# Patient Record
Sex: Female | Born: 1935 | Race: Black or African American | Hispanic: No | Marital: Married | Smoking: Never smoker
Health system: Southern US, Community
[De-identification: ages and names within clinical notes are randomized; demographics above are authoritative.]

## PROBLEM LIST (undated history)

## (undated) DIAGNOSIS — F0280 Dementia in other diseases classified elsewhere without behavioral disturbance: Secondary | ICD-10-CM

## (undated) DIAGNOSIS — E785 Hyperlipidemia, unspecified: Secondary | ICD-10-CM

## (undated) DIAGNOSIS — G309 Alzheimer's disease, unspecified: Secondary | ICD-10-CM

## (undated) DIAGNOSIS — I2699 Other pulmonary embolism without acute cor pulmonale: Secondary | ICD-10-CM

## (undated) DIAGNOSIS — K219 Gastro-esophageal reflux disease without esophagitis: Secondary | ICD-10-CM

## (undated) DIAGNOSIS — N289 Disorder of kidney and ureter, unspecified: Secondary | ICD-10-CM

## (undated) DIAGNOSIS — S02640A Fracture of ramus of mandible, unspecified side, initial encounter for closed fracture: Secondary | ICD-10-CM

## (undated) DIAGNOSIS — E860 Dehydration: Secondary | ICD-10-CM

## (undated) DIAGNOSIS — F0390 Unspecified dementia without behavioral disturbance: Secondary | ICD-10-CM

## (undated) DIAGNOSIS — C801 Malignant (primary) neoplasm, unspecified: Secondary | ICD-10-CM

## (undated) DIAGNOSIS — I10 Essential (primary) hypertension: Secondary | ICD-10-CM

## (undated) DIAGNOSIS — I82409 Acute embolism and thrombosis of unspecified deep veins of unspecified lower extremity: Secondary | ICD-10-CM

## (undated) DIAGNOSIS — E876 Hypokalemia: Secondary | ICD-10-CM

## (undated) DIAGNOSIS — N183 Chronic kidney disease, stage 3 unspecified: Secondary | ICD-10-CM

## (undated) HISTORY — PX: BREAST SURGERY: SHX581

## (undated) HISTORY — DX: Other pulmonary embolism without acute cor pulmonale: I26.99

## (undated) HISTORY — PX: OTHER SURGICAL HISTORY: SHX169

## (undated) HISTORY — DX: Acute embolism and thrombosis of unspecified deep veins of unspecified lower extremity: I82.409

---

## 1998-08-01 DIAGNOSIS — C801 Malignant (primary) neoplasm, unspecified: Secondary | ICD-10-CM

## 1998-08-01 HISTORY — DX: Malignant (primary) neoplasm, unspecified: C80.1

## 1998-11-01 ENCOUNTER — Inpatient Hospital Stay (HOSPITAL_COMMUNITY): Admission: EM | Admit: 1998-11-01 | Discharge: 1998-11-04 | Payer: Self-pay | Admitting: Emergency Medicine

## 1998-11-05 ENCOUNTER — Encounter (HOSPITAL_COMMUNITY): Admission: RE | Admit: 1998-11-05 | Discharge: 1999-02-03 | Payer: Self-pay

## 1998-11-06 ENCOUNTER — Encounter (HOSPITAL_COMMUNITY): Payer: Self-pay | Admitting: Psychiatry

## 1999-02-06 ENCOUNTER — Encounter: Payer: Self-pay | Admitting: Rheumatology

## 1999-02-06 ENCOUNTER — Inpatient Hospital Stay (HOSPITAL_COMMUNITY): Admission: EM | Admit: 1999-02-06 | Discharge: 1999-02-09 | Payer: Self-pay | Admitting: Emergency Medicine

## 1999-02-18 ENCOUNTER — Encounter: Payer: Self-pay | Admitting: Gastroenterology

## 1999-02-18 ENCOUNTER — Ambulatory Visit (HOSPITAL_COMMUNITY): Admission: RE | Admit: 1999-02-18 | Discharge: 1999-02-18 | Payer: Self-pay | Admitting: Gastroenterology

## 1999-10-29 ENCOUNTER — Encounter: Admission: RE | Admit: 1999-10-29 | Discharge: 1999-10-29 | Payer: Self-pay | Admitting: Gastroenterology

## 1999-10-29 ENCOUNTER — Encounter: Payer: Self-pay | Admitting: Gastroenterology

## 2001-01-05 ENCOUNTER — Encounter: Admission: RE | Admit: 2001-01-05 | Discharge: 2001-01-05 | Payer: Self-pay | Admitting: Obstetrics & Gynecology

## 2001-01-05 ENCOUNTER — Encounter: Payer: Self-pay | Admitting: Obstetrics & Gynecology

## 2001-02-22 ENCOUNTER — Other Ambulatory Visit: Admission: RE | Admit: 2001-02-22 | Discharge: 2001-02-22 | Payer: Self-pay | Admitting: Obstetrics & Gynecology

## 2001-02-22 ENCOUNTER — Encounter (INDEPENDENT_AMBULATORY_CARE_PROVIDER_SITE_OTHER): Payer: Self-pay

## 2001-02-22 ENCOUNTER — Encounter (INDEPENDENT_AMBULATORY_CARE_PROVIDER_SITE_OTHER): Payer: Self-pay | Admitting: *Deleted

## 2001-05-09 ENCOUNTER — Encounter: Payer: Self-pay | Admitting: Emergency Medicine

## 2001-05-09 ENCOUNTER — Emergency Department (HOSPITAL_COMMUNITY): Admission: EM | Admit: 2001-05-09 | Discharge: 2001-05-09 | Payer: Self-pay | Admitting: Emergency Medicine

## 2001-05-22 ENCOUNTER — Encounter: Payer: Self-pay | Admitting: Gastroenterology

## 2001-05-22 ENCOUNTER — Encounter: Admission: RE | Admit: 2001-05-22 | Discharge: 2001-05-22 | Payer: Self-pay | Admitting: Gastroenterology

## 2001-06-22 ENCOUNTER — Other Ambulatory Visit: Admission: RE | Admit: 2001-06-22 | Discharge: 2001-06-22 | Payer: Self-pay | Admitting: Obstetrics & Gynecology

## 2001-12-04 ENCOUNTER — Other Ambulatory Visit: Admission: RE | Admit: 2001-12-04 | Discharge: 2001-12-04 | Payer: Self-pay | Admitting: Obstetrics & Gynecology

## 2002-01-07 ENCOUNTER — Encounter: Admission: RE | Admit: 2002-01-07 | Discharge: 2002-01-07 | Payer: Self-pay | Admitting: Obstetrics & Gynecology

## 2002-01-07 ENCOUNTER — Encounter: Payer: Self-pay | Admitting: Obstetrics & Gynecology

## 2002-03-21 ENCOUNTER — Encounter: Admission: RE | Admit: 2002-03-21 | Discharge: 2002-03-21 | Payer: Self-pay | Admitting: Urology

## 2002-03-21 ENCOUNTER — Encounter: Payer: Self-pay | Admitting: Urology

## 2002-04-15 ENCOUNTER — Ambulatory Visit (HOSPITAL_COMMUNITY): Admission: RE | Admit: 2002-04-15 | Discharge: 2002-04-15 | Payer: Self-pay | Admitting: Gastroenterology

## 2002-06-18 ENCOUNTER — Other Ambulatory Visit: Admission: RE | Admit: 2002-06-18 | Discharge: 2002-06-18 | Payer: Self-pay | Admitting: Obstetrics & Gynecology

## 2003-02-13 ENCOUNTER — Encounter: Payer: Self-pay | Admitting: Obstetrics & Gynecology

## 2003-02-13 ENCOUNTER — Encounter: Admission: RE | Admit: 2003-02-13 | Discharge: 2003-02-13 | Payer: Self-pay | Admitting: Obstetrics & Gynecology

## 2003-03-11 ENCOUNTER — Other Ambulatory Visit: Admission: RE | Admit: 2003-03-11 | Discharge: 2003-03-11 | Payer: Self-pay | Admitting: Obstetrics & Gynecology

## 2003-06-16 ENCOUNTER — Other Ambulatory Visit: Admission: RE | Admit: 2003-06-16 | Discharge: 2003-06-16 | Payer: Self-pay | Admitting: Obstetrics & Gynecology

## 2004-02-17 ENCOUNTER — Encounter: Admission: RE | Admit: 2004-02-17 | Discharge: 2004-02-17 | Payer: Self-pay | Admitting: Obstetrics & Gynecology

## 2004-08-03 ENCOUNTER — Encounter: Admission: RE | Admit: 2004-08-03 | Discharge: 2004-08-03 | Payer: Self-pay | Admitting: Obstetrics & Gynecology

## 2005-02-17 ENCOUNTER — Encounter: Admission: RE | Admit: 2005-02-17 | Discharge: 2005-02-17 | Payer: Self-pay | Admitting: Obstetrics & Gynecology

## 2006-02-20 ENCOUNTER — Encounter: Admission: RE | Admit: 2006-02-20 | Discharge: 2006-02-20 | Payer: Self-pay | Admitting: Gastroenterology

## 2006-02-22 ENCOUNTER — Encounter: Admission: RE | Admit: 2006-02-22 | Discharge: 2006-02-22 | Payer: Self-pay | Admitting: Gastroenterology

## 2006-02-22 ENCOUNTER — Encounter (INDEPENDENT_AMBULATORY_CARE_PROVIDER_SITE_OTHER): Payer: Self-pay | Admitting: Diagnostic Radiology

## 2006-02-22 ENCOUNTER — Encounter (INDEPENDENT_AMBULATORY_CARE_PROVIDER_SITE_OTHER): Payer: Self-pay | Admitting: *Deleted

## 2006-03-03 ENCOUNTER — Encounter: Admission: RE | Admit: 2006-03-03 | Discharge: 2006-03-03 | Payer: Self-pay

## 2006-03-13 ENCOUNTER — Encounter (INDEPENDENT_AMBULATORY_CARE_PROVIDER_SITE_OTHER): Payer: Self-pay | Admitting: Specialist

## 2006-03-13 ENCOUNTER — Ambulatory Visit (HOSPITAL_COMMUNITY): Admission: AD | Admit: 2006-03-13 | Discharge: 2006-03-15 | Payer: Self-pay | Admitting: General Surgery

## 2006-04-17 ENCOUNTER — Ambulatory Visit: Payer: Self-pay | Admitting: Oncology

## 2006-04-17 LAB — CBC WITH DIFFERENTIAL/PLATELET
BASO%: 0.4 % (ref 0.0–2.0)
Basophils Absolute: 0 10*3/uL (ref 0.0–0.1)
Eosinophils Absolute: 0.2 10*3/uL (ref 0.0–0.5)
LYMPH%: 30.2 % (ref 14.0–48.0)
MCHC: 33.8 g/dL (ref 32.0–36.0)
MCV: 88.2 fL (ref 81.0–101.0)
MONO#: 0.4 10*3/uL (ref 0.1–0.9)
MONO%: 9.1 % (ref 0.0–13.0)
Platelets: 250 10*3/uL (ref 145–400)

## 2006-04-17 LAB — COMPREHENSIVE METABOLIC PANEL
ALT: 9 U/L (ref 0–40)
CO2: 27 mEq/L (ref 19–32)
Calcium: 10.3 mg/dL (ref 8.4–10.5)
Chloride: 102 mEq/L (ref 96–112)
Creatinine, Ser: 1.17 mg/dL (ref 0.40–1.20)
Potassium: 4.6 mEq/L (ref 3.5–5.3)
Sodium: 140 mEq/L (ref 135–145)
Total Bilirubin: 0.4 mg/dL (ref 0.3–1.2)

## 2006-04-18 LAB — FERRITIN: Ferritin: 23 ng/mL (ref 10–291)

## 2006-04-18 LAB — IRON AND TIBC
%SAT: 22 % (ref 20–55)
TIBC: 277 ug/dL (ref 250–470)

## 2007-02-08 ENCOUNTER — Ambulatory Visit (HOSPITAL_BASED_OUTPATIENT_CLINIC_OR_DEPARTMENT_OTHER): Admission: RE | Admit: 2007-02-08 | Discharge: 2007-02-08 | Payer: Self-pay | Admitting: Orthopedic Surgery

## 2008-09-26 ENCOUNTER — Encounter: Admission: RE | Admit: 2008-09-26 | Discharge: 2008-09-26 | Payer: Self-pay | Admitting: Gastroenterology

## 2010-01-29 ENCOUNTER — Encounter: Admission: RE | Admit: 2010-01-29 | Discharge: 2010-01-29 | Payer: Self-pay | Admitting: Gastroenterology

## 2010-08-04 ENCOUNTER — Encounter: Admission: RE | Admit: 2010-08-04 | Payer: Self-pay | Source: Home / Self Care | Admitting: Gastroenterology

## 2010-08-05 ENCOUNTER — Encounter
Admission: RE | Admit: 2010-08-05 | Discharge: 2010-08-05 | Payer: Self-pay | Source: Home / Self Care | Attending: Gastroenterology | Admitting: Gastroenterology

## 2010-08-06 ENCOUNTER — Emergency Department (HOSPITAL_COMMUNITY)
Admission: EM | Admit: 2010-08-06 | Discharge: 2010-08-06 | Payer: Self-pay | Source: Home / Self Care | Admitting: Emergency Medicine

## 2010-08-21 ENCOUNTER — Encounter: Payer: Self-pay | Admitting: Orthopedic Surgery

## 2010-12-14 NOTE — Op Note (Signed)
[name], Kristina                ACCOUNT NO.:  [id number]   MEDICAL RECORD NO.:  [id number]          PATIENT TYPE:  AMB   LOCATION:  DSC                          FACILITY:  [facility]   PHYSICIAN:  Katy Fitch. Sypher, M.D. DATE OF BIRTH:  March 06, 1931   DATE OF PROCEDURE:  DATE OF DISCHARGE:                               OPERATIVE REPORT   Audio too short to transcribe (less than 5 seconds)      Katy Fitch. Sypher, M.D.     RVS/MEDQ  D:  02/08/2007  T:  02/08/2007  Job:  [id number]

## 2010-12-14 NOTE — Op Note (Signed)
NAMEANISSIA, WESSELLS                ACCOUNT NO.:  [id number]   MEDICAL RECORD NO.:  [id number]          PATIENT TYPE:  AMB   LOCATION:  DSC                          FACILITY:  [facility]   PHYSICIAN:  Katy Fitch. Sypher, M.D. DATE OF BIRTH:  Nov 27, 1930   DATE OF PROCEDURE:  02/08/2007  DATE OF DISCHARGE:                               OPERATIVE REPORT   PREOPERATIVE DIAGNOSIS:  Stenosing tenosynovitis, left long and ring  fingers.   POSTOPERATIVE DIAGNOSIS:  Stenosing tenosynovitis, left long and ring  fingers.   OPERATION:  1. Release of left long finger A1 pulley.  2. Release of left ring finger A1 pulley.   SURGEON:  Katy Fitch. Sypher, M.D.   ASSISTANT:  Molly Maduro Dasnoit, P.A.-C.   ANESTHESIA:  2% lidocaine metacarpal head level block of left long and  ring finger supplemented by IV sedation.   SUPERVISING ANESTHESIOLOGIST:  Zenon Mayo, M.D.   INDICATIONS:  Kristina [name] is a 77 year old woman referred through the  courtesy of Dr. Boyd Kerbs for evaluation and management of left long  and ring trigger fingers.  She had a history of chronic stenosing  tenosynovitis.  She could not tolerate steroid injections.  She does  have type 2 diabetes.  Due to a failure to respond to nonoperative  measures, she is brought to the operating at this time for release of  her left long and ring finger A1 pulleys.  Preoperatively, we discussed  the occasional need to resect the ulnar slip of the flexor digitorum and  superficialis to relieve chronic triggering. After informed consent, she  is brought to the operating room at this time.   PROCEDURE:  Pieper Kasik is brought to the operating room and placed in  the supine position on the operating table.  Following light sedation,  the left arm was prepped with Betadine soap solution and sterilely  draped.  2% lidocaine was infiltrated into the palm in the region of the  intended incision and the flexor sheaths of the left long and ring  fingers.  When anesthesia was satisfactory, the left arm was  exsanguinated with an Esmarch bandage and an arterial tourniquet on the  proximal brachium inflated to 260 mmHg due to mild systolic  hypertension.   The procedure commenced with a short transverse incision in the distal  palmar crease.  This allowed examination of the palmar fascia.  The  longitudinal pretendinous fibers of the palmar fascia were released  followed by careful isolation of the A1 pulleys of the long and ring  fingers.  The A1 pulley of the long finger was released along its ulnar  border. A 0 pulley was released.  The tendons were delivered and a cuff  of inflammatory tenosynovium released with scissors.  Thereafter, full  range of motion of the long finger was recovered.  Attention was then  directed to the ring finger flexors.  The A1 pulley was released.  A 0  pulley was released. The tendon was delivered and, once again, a cuff of  fibrotic tenosynovium released with scissors.  Thereafter, full passive  motion of  the ring finger was recovered.  However, there was slight  trapping against the A2 pulley due to swelling of the profundus tendon.   Given the circumstance, I made the judgment that more likely than not,  with release of the A1 pulley, edema would subside.  We did not resect  the ulnar slip of the flexor digitorum superficialis for either finger.  The wound was inspected for bleeding points and repaired with  interrupted sutures of 5-0 nylon.  A compressive dressing was applied  with Xeroflow, sterile gauze, and Ace bandage.  There were no apparent  complications.      Katy Fitch Sypher, M.D.  Electronically Signed     RVS/MEDQ  D:  02/08/2007  T:  02/08/2007  Job:  [id number]   cc:   Tasia Catchings, M.D.

## 2010-12-17 NOTE — Op Note (Signed)
Kristina [name], DELAINE                ACCOUNT NO.:  [id number]   MEDICAL RECORD NO.:  [id number]          PATIENT TYPE:  OIB   LOCATION:  5729                         FACILITY:  [facility]   PHYSICIAN:  Angelia Mould. Derrell Lolling, M.D.DATE OF BIRTH:  1931/07/15   DATE OF PROCEDURE:  03/13/2006  DATE OF DISCHARGE:                                 OPERATIVE REPORT   PREOPERATIVE DIAGNOSIS:  Bilateral breast cancer.   POSTOPERATIVE DIAGNOSIS:  Bilateral breast cancer.   OPERATION PERFORMED:  1. Injection blue dye, right breast.  2. Right sentinel lymph node mapping and biopsy.  3. Right total mastectomy.  4. Left total mastectomy.   SURGEON:  Dr. Claud Kelp.   OPERATIVE INDICATIONS:  This is a 77 year old black female who underwent  left partial mastectomy, in 1998, for ductal carcinoma in situ.  She had  postoperative adjuvant radiation therapy, but upon evaluation by her medical  oncologist they elected no adjuvant chemotherapy or hormonal replacement  therapy.  She has done well, has no complaints about her breasts.  Recently  she had mammograms and ultrasounds and MRIs of both breasts.  The imaging  shows worrisome microcalcifications in the upper-inner quadrant of the right  breast and in the upper-outer quadrant of the left breast.  Both areas  underwent stereotactic core biopsy.  On the right upper-inner she had high-  grade ductal carcinoma in situ with associated necrosis.  On the left outer,  biopsy showed ductal carcinoma in situ with low to intermediate grade.  Both  biopsies were positive for estrogen and progesterone receptor.  Her MRI  shows DCIS bilaterally and a nodule in the central right breast which has  not been biopsied.  No axillary adenopathy was noted.  We have discussed her  care in breast multidisciplinary conference.  She certainly needs a  mastectomy on the left.  After discussion with the patient, she elected to  have bilateral mastectomies and we are going to do a  sentinel lymph node  mapping and biopsy on the right.  She is brought to operating room  electively.   OPERATIVE TECHNIQUE:  The patient was brought to the holding area where she  underwent injection of 1 mCi of technetium sulfur colloid into the right  subareolar area.  The patient was then taken to operating room and underwent  general endotracheal anesthesia.  The periareolar area of the right breast  was prepped alcohol and I injected 5 mL of blue dye, consisting of 2 mL of  methylene blue and 3 mL of saline, in the subareolar area, and the breast  was massaged for 5 minutes.  We then prepped and draped both breasts, both  axilla and shoulders, and the neck in the routine sterile fashion.   Using the NeoProbe, we identified a hot spot in the right axilla, quite  high.  I made a small transverse incision and dissected down through the  subcutaneous tissue and into the axillary space.  Using the NeoProbe, I  identified a single sentinel lymph node which was hot and blue.  This was  removed and sent to  the lab.  After this was removed there was no more  radioactivity in the axilla.  Imprint cytology of the right axillary  sentinel lymph node was negative for cancer.   Essentially, after this I performed bilateral total mastectomies.  I will  describe them as a simultaneous procedure, although I did the right side  first and the left side second.  Transverse elliptical incisions were made  in both breasts.  Skin flaps were raised superiorly to just below the  clavicle, medially to the parasternal area, inferiorly to the anterior  rectus sheath, and laterally to the latissimus dorsi muscle.  On both sides  we dissected the breast off of pectoralis major muscle, from superior to  inferior, taking the fascia with it, but leaving the muscle behind.  We  dissected the breast off of the pectoralis minor muscle, dissected the  breast superiorly to include the tail of Spence, and removed both  specimens,  labeling them separately.  Hemostasis was excellent and achieved with  electrocautery.  Both wounds were copiously irrigated with saline.  I used  two Blake drains on each side, one across the anterior skin flaps and one  more posteriorly, up toward the axilla.  These were brought out through  separate stab incisions in the anterolateral chest wall, sutured to the skin  with some nylon sutures, and connected to suction bulbs.  The mastectomy  incisions were closed with skin staples, as was the right axillary incision.  Clean bandages were placed to include Adaptic gauze, 4x4s, ABDs and 6-inch  Ace wrap around the patient.  The patient tolerated the procedure well and  was taken recovery room in stable condition.  Estimated blood loss was about  100 mL or less.   COMPLICATIONS:  None.   Sponge, needle and instrument counts were correct.      Angelia Mould. Derrell Lolling, M.D.  Electronically Signed     HMI/MEDQ  D:  03/13/2006  T:  03/13/2006  Job:  [id number]   cc:   Tasia Catchings, M.D.

## 2011-05-17 LAB — I-STAT 8, (EC8 V) (CONVERTED LAB)
Acid-Base Excess: 3 — ABNORMAL HIGH
Chloride: 104
Hemoglobin: 10.9 — ABNORMAL LOW
Operator id: 279391
Potassium: 4.1
pCO2, Ven: 54.4 — ABNORMAL HIGH

## 2012-01-29 ENCOUNTER — Encounter (HOSPITAL_COMMUNITY): Payer: Self-pay | Admitting: Emergency Medicine

## 2012-01-29 ENCOUNTER — Emergency Department (HOSPITAL_COMMUNITY): Payer: Medicare Other

## 2012-01-29 ENCOUNTER — Emergency Department (HOSPITAL_COMMUNITY)
Admission: EM | Admit: 2012-01-29 | Discharge: 2012-01-29 | Disposition: A | Discharge status: Home / Self Care | Payer: Medicare Other | Attending: Emergency Medicine | Admitting: Emergency Medicine

## 2012-01-29 DIAGNOSIS — N189 Chronic kidney disease, unspecified: Secondary | ICD-10-CM | POA: Insufficient documentation

## 2012-01-29 DIAGNOSIS — R197 Diarrhea, unspecified: Secondary | ICD-10-CM

## 2012-01-29 DIAGNOSIS — E119 Type 2 diabetes mellitus without complications: Secondary | ICD-10-CM | POA: Insufficient documentation

## 2012-01-29 DIAGNOSIS — M549 Dorsalgia, unspecified: Secondary | ICD-10-CM | POA: Insufficient documentation

## 2012-01-29 DIAGNOSIS — Z79899 Other long term (current) drug therapy: Secondary | ICD-10-CM | POA: Insufficient documentation

## 2012-01-29 DIAGNOSIS — Z794 Long term (current) use of insulin: Secondary | ICD-10-CM | POA: Insufficient documentation

## 2012-01-29 DIAGNOSIS — R109 Unspecified abdominal pain: Secondary | ICD-10-CM

## 2012-01-29 DIAGNOSIS — I129 Hypertensive chronic kidney disease with stage 1 through stage 4 chronic kidney disease, or unspecified chronic kidney disease: Secondary | ICD-10-CM | POA: Insufficient documentation

## 2012-01-29 DIAGNOSIS — G8929 Other chronic pain: Secondary | ICD-10-CM | POA: Insufficient documentation

## 2012-01-29 HISTORY — DX: Essential (primary) hypertension: I10

## 2012-01-29 HISTORY — DX: Disorder of kidney and ureter, unspecified: N28.9

## 2012-01-29 LAB — CBC WITH DIFFERENTIAL/PLATELET
Eosinophils Relative: 3 % (ref 0–5)
HCT: 34 % — ABNORMAL LOW (ref 36.0–46.0)
Hemoglobin: 11 g/dL — ABNORMAL LOW (ref 12.0–15.0)
Lymphocytes Relative: 25 % (ref 12–46)
Lymphs Abs: 1.4 10*3/uL (ref 0.7–4.0)
MCV: 90.9 fL (ref 78.0–100.0)
Monocytes Absolute: 0.4 10*3/uL (ref 0.1–1.0)
Platelets: 218 10*3/uL (ref 150–400)
RBC: 3.74 MIL/uL — ABNORMAL LOW (ref 3.87–5.11)
WBC: 5.8 10*3/uL (ref 4.0–10.5)

## 2012-01-29 LAB — LIPASE, BLOOD: Lipase: 25 U/L (ref 11–59)

## 2012-01-29 LAB — BASIC METABOLIC PANEL
CO2: 27 mEq/L (ref 19–32)
Calcium: 10.3 mg/dL (ref 8.4–10.5)
Glucose, Bld: 80 mg/dL (ref 70–99)
Sodium: 141 mEq/L (ref 135–145)

## 2012-01-29 LAB — URINE MICROSCOPIC-ADD ON

## 2012-01-29 LAB — URINALYSIS, ROUTINE W REFLEX MICROSCOPIC
Glucose, UA: NEGATIVE mg/dL
Protein, ur: NEGATIVE mg/dL
Specific Gravity, Urine: 1.019 (ref 1.005–1.030)

## 2012-01-29 LAB — HEPATIC FUNCTION PANEL
ALT: 10 U/L (ref 0–35)
AST: 22 U/L (ref 0–37)
Albumin: 3.6 g/dL (ref 3.5–5.2)
Total Protein: 7.5 g/dL (ref 6.0–8.3)

## 2012-01-29 LAB — GLUCOSE, CAPILLARY: Glucose-Capillary: 76 mg/dL (ref 70–99)

## 2012-01-29 LAB — OCCULT BLOOD, POC DEVICE: Fecal Occult Bld: NEGATIVE

## 2012-01-29 MED ORDER — IOHEXOL 300 MG/ML  SOLN
20.0000 mL | INTRAMUSCULAR | Status: AC
  Administered 2012-01-29: 20 mL via ORAL

## 2012-01-29 MED ORDER — GLUCOSE-VITAMIN C 4-6 GM-MG PO CHEW
CHEWABLE_TABLET | ORAL | Status: AC
  Administered 2012-01-29: 1
  Filled 2012-01-29: qty 1

## 2012-01-29 NOTE — ED Notes (Signed)
Pt reports Friday night with dark runny stools. Pt c/o abdominal pain, denies N/V.

## 2012-01-29 NOTE — ED Notes (Signed)
Pt ambulatory to the restroom with minimal assistance.

## 2012-01-29 NOTE — ED Notes (Signed)
Patient transported to CT 

## 2012-01-29 NOTE — ED Notes (Signed)
Attempted twice to insert an IV and failed.  Called IV team to attempt.

## 2012-01-29 NOTE — Discharge Instructions (Signed)
Diarrhea Infections caused by germs (bacterial) or a virus commonly cause diarrhea. Your caregiver has determined that with time, rest and fluids, the diarrhea should improve. In general, eat normally while drinking more water than usual. Although water may prevent dehydration, it does not contain salt and minerals (electrolytes). Broths, weak tea without caffeine and oral rehydration solutions (ORS) replace fluids and electrolytes. Small amounts of fluids should be taken frequently. Large amounts at one time may not be tolerated. Plain water may be harmful in infants and the elderly. Oral rehydrating solutions (ORS) are available at pharmacies and grocery stores. ORS replace water and important electrolytes in proper proportions. Sports drinks are not as effective as ORS and may be harmful due to sugars worsening diarrhea.  ORS is especially recommended for use in children with diarrhea. As a general guideline for children, replace any new fluid losses from diarrhea and/or vomiting with ORS as follows:   If your child weighs 22 pounds or under (10 kg or less), give 60-120 mL ( -  cup or 2 - 4 ounces) of ORS for each episode of diarrheal stool or vomiting episode.   If your child weighs more than 22 pounds (more than 10 kgs), give 120-240 mL ( - 1 cup or 4 - 8 ounces) of ORS for each diarrheal stool or episode of vomiting.   While correcting for dehydration, children should eat normally. However, foods high in sugar should be avoided because this may worsen diarrhea. Large amounts of carbonated soft drinks, juice, gelatin desserts and other highly sugared drinks should be avoided.   After correction of dehydration, other liquids that are appealing to the child may be added. Children should drink small amounts of fluids frequently and fluids should be increased as tolerated. Children should drink enough fluids to keep urine clear or pale yellow.   Adults should eat normally while drinking more fluids  than usual. Drink small amounts of fluids frequently and increase as tolerated. Drink enough fluids to keep urine clear or pale yellow. Broths, weak decaffeinated tea, lemon lime soft drinks (allowed to go flat) and ORS replace fluids and electrolytes.   Avoid:   Carbonated drinks.   Juice.   Extremely hot or cold fluids.   Caffeine drinks.   Fatty, greasy foods.   Alcohol.   Tobacco.   Too much intake of anything at one time.   Gelatin desserts.   Probiotics are active cultures of beneficial bacteria. They may lessen the amount and number of diarrheal stools in adults. Probiotics can be found in yogurt with active cultures and in supplements.   Wash hands well to avoid spreading bacteria and virus.   Anti-diarrheal medications are not recommended for infants and children.   Only take over-the-counter or prescription medicines for pain, discomfort or fever as directed by your caregiver. Do not give aspirin to children because it may cause Reye's Syndrome.   For adults, ask your caregiver if you should continue all prescribed and over-the-counter medicines.   If your caregiver has given you a follow-up appointment, it is very important to keep that appointment. Not keeping the appointment could result in a chronic or permanent injury, and disability. If there is any problem keeping the appointment, you must call back to this facility for assistance.  SEEK IMMEDIATE MEDICAL CARE IF:   You or your child is unable to keep fluids down or other symptoms or problems become worse in spite of treatment.   Vomiting or diarrhea develops and becomes persistent.     There is vomiting of blood or bile (green material).   There is blood in the stool or the stools are black and tarry.   There is no urine output in 6-8 hours or there is only a small amount of very dark urine.   Abdominal pain develops, increases or localizes.   You have a fever.   Your baby is older than 3 months with a  rectal temperature of 102 F (38.9 C) or higher.   Your baby is 44 months old or younger with a rectal temperature of 100.4 F (38 C) or higher.   You or your child develops excessive weakness, dizziness, fainting or extreme thirst.   You or your child develops a rash, stiff neck, severe headache or become irritable or sleepy and difficult to awaken.  MAKE SURE YOU:   Understand these instructions.   Will watch your condition.   Will get help right away if you are not doing well or get worse.  Document Released: 07/08/2002 Document Revised: 07/07/2011 Document Reviewed: 05/25/2009 [facility] Patient Information 2012 [place], [place].  RESOURCE GUIDE  Dental Problems  Patients with Medicaid: [facility] [phone]                                           [phone] [address] Phone:  [phone]                                                   Phone:  [phone]  If unable to pay or uninsured, contact:  [facility] or [facility]. to become qualified for the adult dental clinic.  Chronic Pain Problems Contact [facility]  [phone] Patients need to be referred by their primary care doctor.  Insufficient Money for Medicine Contact [employer]:  call "211" or [facility] [phone].  No Primary Care Doctor Call [facility]  [phone] Other agencies that provide inexpensive medical care    [facility]  [phone]    [facility]  [phone]    [facility]  [phone]    [facility]  [phone]    [facility]  [phone]    [facility]  [phone]  Psychological Services [facility]  [phone] [facility]  [phone] [facility] Mental Health   [phone] (emergency services [phone])  Abuse/Neglect [facility] Child Abuse Hotline [phone] [facility] Child Abuse Hotline [phone] (After  Hours)  Emergency Shelter [facility] [phone]  [facility] [place] [facility] [place] 6021380065 [facility] Alert Services [phone]  MRSA Hotline #:   [phone]    [facility] Resources  [facility]  [employer]                           [facility] Dept. [address] [place]                     [address]  [address]  [place]                                               Cristobal Goldmann Phone:  [phone]                                  Phone:  [phone]                   Phone:  [phone]  [facility] Mental Health Phone:  [phone]  [facility] Child Abuse Hotline 480-619-0866 [phone] (After Hours)

## 2012-01-29 NOTE — ED Notes (Signed)
Patient is alert and oriented x4.  Patient is free of pain. Patient is leaving with significant other who will transport her home.  Vitals signs are charted.  No further action taken.

## 2012-01-29 NOTE — ED Provider Notes (Signed)
History     CSN: [id number]  Arrival date & time 01/29/12  1526   First MD Initiated Contact with Patient 01/29/12 1639      Chief Complaint  Patient presents with  . Diarrhea    (Consider location/radiation/quality/duration/timing/severity/associated sxs/prior treatment) HPI  80yoM h/o HTN, DM pw abdominal pain, diarrhea. C/O several episodes of dark stool per day which is watery. C/O crampy abdominal, diffuse. +Nausea without vomiting. No h/o GI bleed in past. Last colonoscopy 10 years ago--nl per patient. Chronic back pain at baseline. Denies hematuria/dysuria/freq/urgency. Denies fever, chills. No recent hosp or abx use. Concerned about whether or not she could be having GIB  No h/o abdominal surgeries  ED Notes, ED Provider Notes from 01/29/12 0000 to 01/29/12 16:12:20       Keshia Tommy Rainwater, RN 01/29/2012 16:09      Pt reports Friday night with dark runny stools. Pt c/o abdominal pain, denies N/V.     Past Medical History  Diagnosis Date  . Diabetes mellitus   . Hypertension   . Renal disorder     Past Surgical History  Procedure Date  . Breast surgery   . Mastecomy     History reviewed. No pertinent family history.  History  Substance Use Topics  . Smoking status: Never Smoker   . Smokeless tobacco: Not on file  . Alcohol Use: No    OB History    Grav Para Term Preterm Abortions TAB SAB Ect Mult Living                  Review of Systems  All other systems reviewed and are negative.  except as noted HPI   Allergies  Penicillins  Home Medications   Current Outpatient Rx  Name Route Sig Dispense Refill  . CALCITRIOL 0.25 MCG PO CAPS Oral Take 0.25 mcg by mouth daily.    . CHOLECALCIFEROL 400 UNITS PO TABS Oral Take 400 Units by mouth daily.    Marland Kitchen HYDROCHLOROTHIAZIDE 25 MG PO TABS Oral Take 25 mg by mouth daily.    . INSULIN LISPRO PROT & LISPRO (75-25) 100 UNIT/ML [place] SUSP Subcutaneous Inject 8-10 Units into the skin 2 (two) times daily. Uses 10  units in the AM and 8 units at bedtime    . OMEPRAZOLE 20 MG PO CPDR Oral Take 20 mg by mouth daily.    . SERTRALINE HCL 50 MG PO TABS Oral Take 50 mg by mouth daily.    Marland Kitchen SIMVASTATIN 40 MG PO TABS Oral Take 40 mg by mouth every evening.    Marland Kitchen VALSARTAN 160 MG PO TABS Oral Take 160 mg by mouth daily.      BP 114/44  Pulse 63  Temp 98.2 F (36.8 C) (Oral)  Resp 18  SpO2 100%  Physical Exam  Nursing note and vitals reviewed. Constitutional: She is oriented to person, place, and time. She appears well-developed.  HENT:  Head: Atraumatic.  Mouth/Throat: Oropharynx is clear and moist.  Eyes: Conjunctivae and EOM are normal. Pupils are equal, round, and reactive to light.  Neck: Normal range of motion. Neck supple.  Cardiovascular: Normal rate, regular rhythm, normal heart sounds and intact distal pulses.   Pulmonary/Chest: Effort normal and breath sounds normal. No respiratory distress. She has no wheezes. She has no rales.  Abdominal: Soft. She exhibits no distension. There is tenderness. There is no rebound and no guarding.       Diffuse abd ttp no r/g  Genitourinary:  Brown stool, heme neg No stool in rectal vault  Musculoskeletal: Normal range of motion.  Neurological: She is alert and oriented to person, place, and time.  Skin: Skin is warm and dry. No rash noted.  Psychiatric: She has a normal mood and affect.    ED Course  Procedures (including critical care time)  Labs Reviewed  CBC WITH DIFFERENTIAL - Abnormal; Notable for the following:    RBC 3.74 (*)     Hemoglobin 11.0 (*)     HCT 34.0 (*)     All other components within normal limits  BASIC METABOLIC PANEL - Abnormal; Notable for the following:    BUN 38 (*)     Creatinine, Ser 1.86 (*)     GFR calc non Af Amer 25 (*)     GFR calc Af Amer 29 (*)     All other components within normal limits  URINALYSIS, ROUTINE W REFLEX MICROSCOPIC - Abnormal; Notable for the following:    Hgb urine dipstick TRACE (*)      Leukocytes, UA TRACE (*)     All other components within normal limits  GLUCOSE, CAPILLARY - Abnormal; Notable for the following:    Glucose-Capillary 66 (*)     All other components within normal limits  URINE MICROSCOPIC-ADD ON - Abnormal; Notable for the following:    Squamous Epithelial / LPF FEW (*)     All other components within normal limits  LIPASE, BLOOD  HEPATIC FUNCTION PANEL  OCCULT BLOOD, POC DEVICE  GLUCOSE, CAPILLARY  OCCULT BLOOD, POC DEVICE  CLOSTRIDIUM DIFFICILE BY PCR  CLOSTRIDIUM DIFFICILE BY PCR   Ct Abdomen Pelvis Wo Contrast  01/29/2012  *RADIOLOGY REPORT*  Clinical Data: Left lower quadrant pain  CT ABDOMEN AND PELVIS WITHOUT CONTRAST  Technique:  Multidetector CT imaging of the abdomen and pelvis was performed following the standard protocol without intravenous contrast.  Comparison: 09/26/2008  Findings: Coronary artery calcification.  Lung bases are clear.  Organ abnormality/lesion detection is limited in the absence of intravenous contrast. Within this limitation, punctate liver calcifications in keeping with sequelae of prior granulomas infection.  Unremarkable spleen.  Atrophic pancreas. Unchanged adrenal glands with nodular enlargement of the lateral limb left adrenal gland.  There is questionable layering sludge or stones within the gallbladder.  No pericholecystic fluid.  No biliary ductal dilatation.  Symmetric renal size.  No hydronephrosis or hydroureter.  Mildly lobular contours.  Colonic diverticulosis.  No overt evidence for diverticulitis.  No bowel obstruction.  No free intraperitoneal air or fluid.  No lymphadenopathy.  There is scattered atherosclerotic calcification of the aorta and its branches. No aneurysmal dilatation.  Decompressed bladder.  Unremarkable noncontrast appearance to the uterus and adnexa.  Multilevel degenerative changes of the imaged spine. No acute or aggressive appearing osseous lesion.  IMPRESSION: Within limitations of a  noncontrast examination, no acute abnormality identified.  Colonic diverticulosis without overt evidence for diverticulitis.  Original Report Authenticated By: Waneta Martins, M.D.    1. Diarrhea   2. Abdominal cramping   3. Chronic renal insufficiency     MDM  Diarrhea and abdominal cramping. The patient appears clinically hydrated. She has no blood in her stool. C. difficile ordered. If the patient is able to produce a sample prior to discharge will be sent. CT A/P without contrast (CRI)She is advised to follow with her primary care doctor tomorrow for an appointment early next week. Advised to drink clear fluids. Advance diet as tolerated. No EMC precluding discharge  at this time. Tolerating PO in ED and can self hydrate. Per pt her GFR usually 25. 29 today and Cr 1.8 likely 2/2 mild dehydration. Given Precautions for return. PMD, Nephrology f/u.         Forbes Cellar, MD 01/29/12 2206

## 2012-10-15 ENCOUNTER — Emergency Department (HOSPITAL_COMMUNITY): Payer: Medicare Other

## 2012-10-15 ENCOUNTER — Inpatient Hospital Stay (HOSPITAL_COMMUNITY)
Admission: EM | Admit: 2012-10-15 | Discharge: 2012-10-21 | DRG: 176 | Disposition: A | Discharge status: Home / Self Care | Payer: Medicare Other | Attending: Internal Medicine | Admitting: Internal Medicine

## 2012-10-15 ENCOUNTER — Encounter (HOSPITAL_COMMUNITY): Payer: Self-pay | Admitting: Emergency Medicine

## 2012-10-15 DIAGNOSIS — I82402 Acute embolism and thrombosis of unspecified deep veins of left lower extremity: Secondary | ICD-10-CM

## 2012-10-15 DIAGNOSIS — M1990 Unspecified osteoarthritis, unspecified site: Secondary | ICD-10-CM | POA: Diagnosis present

## 2012-10-15 DIAGNOSIS — E119 Type 2 diabetes mellitus without complications: Secondary | ICD-10-CM | POA: Diagnosis present

## 2012-10-15 DIAGNOSIS — E7800 Pure hypercholesterolemia, unspecified: Secondary | ICD-10-CM | POA: Diagnosis present

## 2012-10-15 DIAGNOSIS — I82409 Acute embolism and thrombosis of unspecified deep veins of unspecified lower extremity: Secondary | ICD-10-CM | POA: Diagnosis present

## 2012-10-15 DIAGNOSIS — N183 Chronic kidney disease, stage 3 unspecified: Secondary | ICD-10-CM | POA: Diagnosis present

## 2012-10-15 DIAGNOSIS — M810 Age-related osteoporosis without current pathological fracture: Secondary | ICD-10-CM | POA: Diagnosis present

## 2012-10-15 DIAGNOSIS — E162 Hypoglycemia, unspecified: Secondary | ICD-10-CM | POA: Diagnosis present

## 2012-10-15 DIAGNOSIS — F3289 Other specified depressive episodes: Secondary | ICD-10-CM | POA: Diagnosis present

## 2012-10-15 DIAGNOSIS — F329 Major depressive disorder, single episode, unspecified: Secondary | ICD-10-CM | POA: Diagnosis present

## 2012-10-15 DIAGNOSIS — I2699 Other pulmonary embolism without acute cor pulmonale: Principal | ICD-10-CM | POA: Diagnosis present

## 2012-10-15 DIAGNOSIS — I824Z9 Acute embolism and thrombosis of unspecified deep veins of unspecified distal lower extremity: Secondary | ICD-10-CM | POA: Diagnosis present

## 2012-10-15 DIAGNOSIS — K219 Gastro-esophageal reflux disease without esophagitis: Secondary | ICD-10-CM | POA: Diagnosis present

## 2012-10-15 DIAGNOSIS — I10 Essential (primary) hypertension: Secondary | ICD-10-CM | POA: Diagnosis present

## 2012-10-15 DIAGNOSIS — I129 Hypertensive chronic kidney disease with stage 1 through stage 4 chronic kidney disease, or unspecified chronic kidney disease: Secondary | ICD-10-CM | POA: Diagnosis present

## 2012-10-15 DIAGNOSIS — R0600 Dyspnea, unspecified: Secondary | ICD-10-CM | POA: Diagnosis present

## 2012-10-15 HISTORY — DX: Malignant (primary) neoplasm, unspecified: C80.1

## 2012-10-15 LAB — BASIC METABOLIC PANEL
CO2: 29 mEq/L (ref 19–32)
Chloride: 101 mEq/L (ref 96–112)
GFR calc Af Amer: 35 mL/min — ABNORMAL LOW (ref >90)
Potassium: 4 mEq/L (ref 3.5–5.1)
Sodium: 138 mEq/L (ref 135–145)

## 2012-10-15 LAB — CBC WITH DIFFERENTIAL/PLATELET
Basophils Absolute: 0 10*3/uL (ref 0.0–0.1)
Basophils Relative: 0 % (ref 0–1)
HCT: 32.5 % — ABNORMAL LOW (ref 36.0–46.0)
Lymphocytes Relative: 18 % (ref 12–46)
MCHC: 32.9 g/dL (ref 30.0–36.0)
Monocytes Absolute: 0.3 10*3/uL (ref 0.1–1.0)
Neutro Abs: 3.9 10*3/uL (ref 1.7–7.7)
Neutrophils Relative %: 75 % (ref 43–77)
RDW: 13 % (ref 11.5–15.5)
WBC: 5.2 10*3/uL (ref 4.0–10.5)

## 2012-10-15 LAB — URINALYSIS, ROUTINE W REFLEX MICROSCOPIC
Bilirubin Urine: NEGATIVE
Ketones, ur: NEGATIVE mg/dL
Nitrite: NEGATIVE
Protein, ur: NEGATIVE mg/dL
Specific Gravity, Urine: 1.018 (ref 1.005–1.030)
Urobilinogen, UA: 0.2 mg/dL (ref 0.0–1.0)

## 2012-10-15 LAB — GLUCOSE, CAPILLARY
Glucose-Capillary: 149 mg/dL — ABNORMAL HIGH (ref 70–99)
Glucose-Capillary: 123 mg/dL — ABNORMAL HIGH (ref 70–99)

## 2012-10-15 LAB — POCT I-STAT TROPONIN I: Troponin i, poc: 0 ng/mL (ref 0.00–0.08)

## 2012-10-15 LAB — URINE MICROSCOPIC-ADD ON

## 2012-10-15 MED ORDER — ACETAMINOPHEN 325 MG PO TABS: 650.0000 mg | ORAL_TABLET | Freq: Four times a day (QID) | ORAL | Status: DC | PRN

## 2012-10-15 MED ORDER — POLYETHYLENE GLYCOL 3350 17 G PO PACK
17.0000 g | PACK | Freq: Every day | ORAL | Status: DC | PRN
  Filled 2012-10-15: qty 1

## 2012-10-15 MED ORDER — SERTRALINE HCL 50 MG PO TABS
50.0000 mg | ORAL_TABLET | Freq: Every day | ORAL | Status: DC
  Administered 2012-10-16 – 2012-10-21 (×6): 50 mg via ORAL
  Filled 2012-10-15 (×6): qty 1

## 2012-10-15 MED ORDER — HEPARIN BOLUS VIA INFUSION
4000.0000 [IU] | Freq: Once | INTRAVENOUS | Status: AC
  Administered 2012-10-15: 4000 [IU] via INTRAVENOUS
  Filled 2012-10-15: qty 4000

## 2012-10-15 MED ORDER — KETOROLAC TROMETHAMINE 0.5 % OP SOLN
1.0000 [drp] | Freq: Four times a day (QID) | OPHTHALMIC | Status: DC
  Administered 2012-10-16 – 2012-10-20 (×20): 1 [drp] via OPHTHALMIC
  Filled 2012-10-15: qty 3

## 2012-10-15 MED ORDER — DEXTROSE 50 % IV SOLN
25.0000 mL | Freq: Once | INTRAVENOUS | Status: AC
  Administered 2012-10-15: 25 mL via INTRAVENOUS
  Filled 2012-10-15: qty 50

## 2012-10-15 MED ORDER — WARFARIN - PHARMACIST DOSING INPATIENT: Freq: Every day | Status: DC

## 2012-10-15 MED ORDER — SODIUM CHLORIDE 0.9 % IJ SOLN
3.0000 mL | Freq: Two times a day (BID) | INTRAMUSCULAR | Status: DC
  Administered 2012-10-15: 3 mL via INTRAVENOUS

## 2012-10-15 MED ORDER — SODIUM CHLORIDE 0.9 % IV SOLN
INTRAVENOUS | Status: AC
  Administered 2012-10-15: via INTRAVENOUS

## 2012-10-15 MED ORDER — INSULIN ASPART 100 UNIT/ML ~~LOC~~ SOLN
0.0000 [IU] | Freq: Three times a day (TID) | SUBCUTANEOUS | Status: DC
  Administered 2012-10-16 – 2012-10-21 (×6): 1 [IU] via SUBCUTANEOUS

## 2012-10-15 MED ORDER — INSULIN ASPART 100 UNIT/ML ~~LOC~~ SOLN: 0.0000 [IU] | Freq: Every day | SUBCUTANEOUS | Status: DC

## 2012-10-15 MED ORDER — ONDANSETRON HCL 4 MG PO TABS: 4.0000 mg | ORAL_TABLET | Freq: Four times a day (QID) | ORAL | Status: DC | PRN

## 2012-10-15 MED ORDER — PANTOPRAZOLE SODIUM 40 MG PO TBEC
40.0000 mg | DELAYED_RELEASE_TABLET | Freq: Every day | ORAL | Status: DC
  Administered 2012-10-16 – 2012-10-21 (×6): 40 mg via ORAL
  Filled 2012-10-15 (×5): qty 1

## 2012-10-15 MED ORDER — DOCUSATE SODIUM 100 MG PO CAPS
100.0000 mg | ORAL_CAPSULE | Freq: Two times a day (BID) | ORAL | Status: DC
  Administered 2012-10-16 – 2012-10-21 (×10): 100 mg via ORAL
  Filled 2012-10-15 (×12): qty 1

## 2012-10-15 MED ORDER — HEPARIN (PORCINE) IN NACL 100-0.45 UNIT/ML-% IJ SOLN
1000.0000 [IU]/h | INTRAMUSCULAR | Status: DC
  Administered 2012-10-15: 1000 [IU]/h via INTRAVENOUS
  Filled 2012-10-15 (×4): qty 250

## 2012-10-15 MED ORDER — SENNA 8.6 MG PO TABS
1.0000 | ORAL_TABLET | Freq: Two times a day (BID) | ORAL | Status: DC
  Administered 2012-10-16 – 2012-10-21 (×10): 8.6 mg via ORAL
  Filled 2012-10-15 (×14): qty 1

## 2012-10-15 MED ORDER — ACETAMINOPHEN 650 MG RE SUPP: 650.0000 mg | Freq: Four times a day (QID) | RECTAL | Status: DC | PRN

## 2012-10-15 MED ORDER — CALCITRIOL 0.25 MCG PO CAPS
0.2500 ug | ORAL_CAPSULE | Freq: Every day | ORAL | Status: DC
Start: 2012-10-16 — End: 2012-10-21
  Administered 2012-10-16 – 2012-10-21 (×6): 0.25 ug via ORAL
  Filled 2012-10-15 (×6): qty 1

## 2012-10-15 MED ORDER — SIMVASTATIN 40 MG PO TABS
40.0000 mg | ORAL_TABLET | Freq: Every evening | ORAL | Status: DC
  Administered 2012-10-16 – 2012-10-20 (×5): 40 mg via ORAL
  Filled 2012-10-15 (×6): qty 1

## 2012-10-15 MED ORDER — ONDANSETRON HCL 4 MG/2ML IJ SOLN: 4.0000 mg | Freq: Four times a day (QID) | INTRAMUSCULAR | Status: DC | PRN

## 2012-10-15 MED ORDER — IOHEXOL 350 MG/ML SOLN
100.0000 mL | Freq: Once | INTRAVENOUS | Status: AC | PRN
  Administered 2012-10-15: 100 mL via INTRAVENOUS

## 2012-10-15 NOTE — ED Notes (Signed)
Pt c/o SOB and dizziness with syncopal episode today; pt sts hx of similar in past; pt denies CP

## 2012-10-15 NOTE — ED Provider Notes (Signed)
History     CSN: [id number]  Arrival date & time 10/15/12  1542   First MD Initiated Contact with Patient 10/15/12 1608      Chief Complaint  Patient presents with  . Loss of Consciousness  . Shortness of Breath    (Consider location/radiation/quality/duration/timing/severity/associated sxs/prior treatment) HPI Pt states she had several episodes of being unable to "catch her breath". She states she was in another room and had another episode of breathlessness and called to her husband. When he arrived she collapsed in his arms. No trauma. No definite LOC. Pt denied chest pain, recent fever chills, abd pain, urinary symptoms. Pt has had multiple falls in the past but states this was different in the fact she did not lose balance, she became weak. No weakness, dizziness, SOB now. Pt states she is at her baseline. Past Medical History  Diagnosis Date  . Diabetes mellitus   . Hypertension   . Renal disorder     Past Surgical History  Procedure Laterality Date  . Breast surgery    . Mastecomy      History reviewed. No pertinent family history.  History  Substance Use Topics  . Smoking status: Never Smoker   . Smokeless tobacco: Not on file  . Alcohol Use: No    OB History   Grav Para Term Preterm Abortions TAB SAB Ect Mult Living                  Review of Systems  Constitutional: Negative for fever and chills.  HENT: Negative for neck pain.   Respiratory: Positive for shortness of breath. Negative for cough and wheezing.   Cardiovascular: Negative for chest pain, palpitations and leg swelling.  Gastrointestinal: Negative for nausea, vomiting and abdominal pain.  Genitourinary: Negative for dysuria and frequency.  Musculoskeletal: Negative for back pain.  Skin: Negative for rash and wound.  Neurological: Positive for weakness. Negative for dizziness, syncope, light-headedness, numbness and headaches.  All other systems reviewed and are negative.    Allergies   Oxycodone and Penicillins  Home Medications   Current Outpatient Rx  Name  Route  Sig  Dispense  Refill  . acetaminophen (TYLENOL) 500 MG tablet   Oral   Take 1,000 mg by mouth every 6 (six) hours as needed for pain.         . calcitRIOL (ROCALTROL) 0.25 MCG capsule   Oral   Take 0.25 mcg by mouth daily.         . hydrochlorothiazide (HYDRODIURIL) 25 MG tablet   Oral   Take 25 mg by mouth daily.         . insulin lispro protamine-insulin lispro (HUMALOG 75/25) (75-25) 100 UNIT/ML SUSP   Subcutaneous   Inject 8-10 Units into the skin 2 (two) times daily. Uses 10 units in the AM and 8 units at bedtime         . ketorolac (ACULAR) 0.5 % ophthalmic solution   Left Eye   Place 1 drop into the left eye 4 (four) times daily.         Marland Kitchen omeprazole (PRILOSEC) 20 MG capsule   Oral   Take 20 mg by mouth daily.         . sertraline (ZOLOFT) 50 MG tablet   Oral   Take 50 mg by mouth daily.         . simvastatin (ZOCOR) 40 MG tablet   Oral   Take 40 mg by mouth every evening.         Marland Kitchen  valsartan (DIOVAN) 160 MG tablet   Oral   Take 160 mg by mouth daily.           BP 122/44  Pulse 71  Temp(Src) 97.2 F (36.2 C) (Oral)  Resp 17  SpO2 100%  Physical Exam  Nursing note and vitals reviewed. Constitutional: She is oriented to person, place, and time. She appears well-developed and well-nourished. No distress.  HENT:  Head: Normocephalic and atraumatic.  Mouth/Throat: Oropharynx is clear and moist.  Eyes: EOM are normal. Pupils are equal, round, and reactive to light.  Neck: Normal range of motion. Neck supple.  Cardiovascular: Normal rate and regular rhythm.   Pulmonary/Chest: Effort normal and breath sounds normal. No respiratory distress. She has no wheezes. She has no rales. She exhibits no tenderness.  Abdominal: Soft. Bowel sounds are normal. She exhibits no mass. There is no tenderness. There is no rebound and no guarding.  Musculoskeletal: Normal  range of motion. She exhibits no edema and no tenderness.  Neurological: She is alert and oriented to person, place, and time.  5/5 motor in all ext, sensation intact.   Skin: Skin is warm and dry. No rash noted. No erythema.  Psychiatric: She has a normal mood and affect. Her behavior is normal.    ED Course  Procedures (including critical care time)  Labs Reviewed  CBC WITH DIFFERENTIAL - Abnormal; Notable for the following:    RBC 3.67 (*)    Hemoglobin 10.7 (*)    HCT 32.5 (*)    All other components within normal limits  BASIC METABOLIC PANEL - Abnormal; Notable for the following:    Glucose, Bld 146 (*)    BUN 37 (*)    Creatinine, Ser 1.58 (*)    GFR calc non Af Amer 30 (*)    GFR calc Af Amer 35 (*)    All other components within normal limits  GLUCOSE, CAPILLARY - Abnormal; Notable for the following:    Glucose-Capillary 149 (*)    All other components within normal limits  URINALYSIS, ROUTINE W REFLEX MICROSCOPIC - Abnormal; Notable for the following:    Hgb urine dipstick SMALL (*)    All other components within normal limits  D-DIMER, QUANTITATIVE - Abnormal; Notable for the following:    D-Dimer, Quant 11.00 (*)    All other components within normal limits  URINE MICROSCOPIC-ADD ON - Abnormal; Notable for the following:    Squamous Epithelial / LPF FEW (*)    All other components within normal limits  GLUCOSE, CAPILLARY  POCT I-STAT TROPONIN I   Dg Chest 2 View  10/15/2012  *RADIOLOGY REPORT*  Clinical Data: Shortness of breath  CHEST - 2 VIEW  Comparison: 03/09/2006  Findings: Stable hyperinflation compatible with background COPD/emphysema.  Normal heart size and vascularity.  No focal pneumonia, collapse, consolidation, edema, effusion or pneumothorax.  Trachea midline.  Degenerative changes of the spine.  IMPRESSION: Stable hyperinflation.  No superimposed acute process   Original Report Authenticated By: Judie Petit. Miles Costain, M.D.    Ct Head Wo Contrast  10/15/2012   *RADIOLOGY REPORT*  Clinical Data: Confusion and memory loss  CT HEAD WITHOUT CONTRAST  Technique:  Contiguous axial images were obtained from the base of the skull through the vertex without contrast.  Comparison: None  Findings: Ventricle size is normal.  Negative for intracranial hemorrhage or acute infarct.  Negative for mass or edema.  No bony abnormality in the skull.  IMPRESSION: Negative   Original Report Authenticated By: Onalee Hua  Chestine Spore, M.D.    Ct Angio Chest Pe W/cm &/or Wo Cm  10/15/2012  *RADIOLOGY REPORT*  Clinical Data: .  SOB.  Near-syncope  CT ANGIOGRAPHY CHEST  Technique:  Multidetector CT imaging of the chest using the standard protocol during bolus administration of intravenous contrast. Multiplanar reconstructed images including MIPs were obtained and reviewed to evaluate the vascular anatomy.  Contrast: OMNIPAQUE IOHEXOL 350 MG/ML SOLN  Comparison: 10/15/2012  Findings: Pulmonary emboli are present in the right upper lobe and right lower lobe pulmonary arteries.  No emboli are seen on the left.  Heart size is normal.  No pericardial effusion.  Aortic diameter is normal with mild atherosclerotic disease.  Lungs are clear without infiltrate or effusion.  No mass or adenopathy is present.  No acute spinal abnormality.  IMPRESSION: Right-sided pulmonary emboli.  Moderate clot burden on the right.  I discussed the findings by telephone with Dr. Ranae Palms   Original Report Authenticated By: Janeece Riggers, M.D.      1. Pulmonary embolism on right      Date: 10/15/2012  Rate: 72  Rhythm: normal sinus rhythm  QRS Axis: normal  Intervals: normal  ST/T Wave abnormalities: normal  Conduction Disutrbances:none  Narrative Interpretation:   Old EKG Reviewed: none available    MDM    Pt is currently asymptomatic. VSS. Discussed with Dr Adela Glimpse. Will admit.       Loren Racer, MD 10/15/12 2128

## 2012-10-15 NOTE — ED Notes (Signed)
Pt states she woke up from rest and felt like she could not breath; pt states her chest hurt; pt denies chest pain; pt alert and mentating appropriately; CT called and notified pt ready for transport to CT.

## 2012-10-15 NOTE — ED Notes (Signed)
MD Yelverton at bedside. 

## 2012-10-15 NOTE — ED Notes (Signed)
Family at bedside states pt got dizzy and passed out; family states that she does not pass all the way out; pt family states she keeps her eyes open when she falls; pt states she remembers being out for a little bit; pt denies dizziness and lightheadedness; pt denies numbness and tingling; pt denies n/v/d; pt states "when I feel that funny feeling it feels like I can't breathe, swallow or do anything." Pt denies difficulty breathing currently.

## 2012-10-15 NOTE — ED Notes (Signed)
Pt remains in CT at this time.  

## 2012-10-15 NOTE — Progress Notes (Signed)
ANTICOAGULATION CONSULT NOTE - Initial Consult  Pharmacy Consult for Heparin Indication: New pulmonary embolus  Allergies  Allergen Reactions  . Oxycodone     Caused patient to go crazy  . Penicillins Rash    Patient Measurements: Height: 5\' 5"  (165.1 cm) Weight: 142 lb (64.411 kg) IBW/kg (Calculated) : 57 Heparin Dosing Weight: 64.4 kg  Vital Signs: Temp: 97.2 F (36.2 C) (03/17 1549) Temp src: Oral (03/17 1549) BP: 132/59 mmHg (03/17 2200) Pulse Rate: 69 (03/17 2200)  Labs:  Recent Labs  10/15/12 1550  HGB 10.7*  HCT 32.5*  PLT 197  CREATININE 1.58*    Estimated Creatinine Clearance: 26 ml/min (by C-G formula based on Cr of 1.58).   Medical History: Past Medical History  Diagnosis Date  . Diabetes mellitus   . Hypertension   . Renal disorder   . Cancer 2000    sp bilateral masectomy, sp chemo    Assessment: 79 y.o. F who presented to the [facility] with SOB and LOC. Work-up showed an elevated D-dimer and CT angio revealed a new acute right-sided pulmonary emboli of moderate clot burden. The patient was not on any anticoagulants PTA, has no recent hx surgeries/bleeding, or prior hx CVA. Baseline Hgb/Hct 10.7/32/5, plts 197. Hep Wt~64.4 kg  Goal of Therapy:  Heparin level 0.3-0.7 units/ml Monitor platelets by anticoagulation protocol: Yes   Plan:  1. Heparin bolus of 4000 units x 1 2. Initiate heparin drip at a rate of 1000 units/hr (10 ml/hr) 3. Daily heparin levels 4. Will continue to monitor for any signs/symptoms of bleeding and will follow up with heparin level in 8 hours   Georgina Pillion, PharmD, BCPS Clinical Pharmacist Pager: [phone] 10/15/2012 10:33 PM

## 2012-10-15 NOTE — ED Notes (Signed)
Pt returned from CT, placed back on monitor.

## 2012-10-15 NOTE — ED Notes (Signed)
Family at bedside. 

## 2012-10-15 NOTE — ED Notes (Signed)
Pt transported to CT ?

## 2012-10-15 NOTE — ED Notes (Signed)
Dr Yelverton at bedside.  

## 2012-10-15 NOTE — H&P (Signed)
PCP:  Charolett Bumpers, MD  Renal Deaterding  Endocrinology : Lucianne Muss  Chief Complaint:   Dyspnea  HPI: Kristina [name] is a 79 y.o. female   has a past medical history of Diabetes mellitus; Hypertension; Renal disorder; and Cancer (2000).   Presented with  1 wk hx of intermittent episodes of dyspnea lasting few minutes at time. Denies any chest pain. Today the episode was so severe that she callupsed. She went to [place] and [place] week ago on a trip but have been traveling extensively even priror to this.  She reports some swelling in her left leg and some pain bilaterally this has resolved but shortly after she started to have shortness of breath.  She has hx of CKD with elevated Cr last Cr was 1.8. She did have CTA done today to evaluate her symptoms that showed Right side PE. Spoke to Dr. Caryn Section with renal who recommends at this point IVF and monitoring of Cr call Renal in AM if worsening reanal function.   Of note in ER her blood sugar was noted to be down to 79 this was treated with D50. Patient has been NPO all day.   Review of Systems:    Pertinent positives include: shortness of breath at rest. Constipation, leg swelling and pain  Constitutional:  No weight loss, night sweats, Fevers, chills, fatigue, weight loss  HEENT:  No headaches, Difficulty swallowing,Tooth/dental problems,Sore throat,  No sneezing, itching, ear ache, nasal congestion, post nasal drip,  Cardio-vascular:  No chest pain, Orthopnea, PND, anasarca, dizziness, palpitations.no Bilateral lower extremity swelling  GI:  No heartburn, indigestion, abdominal pain, nausea, vomiting, diarrhea, change in bowel habits, loss of appetite, melena, blood in stool, hematemesis Resp:   No dyspnea on exertion, No excess mucus, no productive cough, No non-productive cough, No coughing up of blood.No change in color of mucus.No wheezing. Skin:  no rash or lesions. No jaundice GU:  no dysuria, change in color of urine,  no urgency or frequency. No straining to urinate.  No flank pain.  Musculoskeletal:  No joint pain or no joint swelling. No decreased range of motion. No back pain.  Psych:  No change in mood or affect. No depression or anxiety. No memory loss.  Neuro: no localizing neurological complaints, no tingling, no weakness, no double vision, no gait abnormality, no slurred speech, no confusion  Otherwise ROS are negative except for above, 10 systems were reviewed  Past Medical History: Past Medical History  Diagnosis Date  . Diabetes mellitus   . Hypertension   . Renal disorder   . Cancer 2000    sp bilateral masectomy, sp chemo   Past Surgical History  Procedure Laterality Date  . Breast surgery    . Mastecomy       Medications: Prior to Admission medications   Medication Sig Start Date End Date Taking? Authorizing Provider  acetaminophen (TYLENOL) 500 MG tablet Take 1,000 mg by mouth every 6 (six) hours as needed for pain.   Yes Historical Provider, MD  calcitRIOL (ROCALTROL) 0.25 MCG capsule Take 0.25 mcg by mouth daily.   Yes Historical Provider, MD  hydrochlorothiazide (HYDRODIURIL) 25 MG tablet Take 25 mg by mouth daily.   Yes Historical Provider, MD  insulin lispro protamine-insulin lispro (HUMALOG 75/25) (75-25) 100 UNIT/ML SUSP Inject 8-10 Units into the skin 2 (two) times daily. Uses 10 units in the AM and 8 units at bedtime   Yes Historical Provider, MD  ketorolac (ACULAR) 0.5 % ophthalmic solution Place  1 drop into the left eye 4 (four) times daily.   Yes Historical Provider, MD  omeprazole (PRILOSEC) 20 MG capsule Take 20 mg by mouth daily.   Yes Historical Provider, MD  sertraline (ZOLOFT) 50 MG tablet Take 50 mg by mouth daily.   Yes Historical Provider, MD  simvastatin (ZOCOR) 40 MG tablet Take 40 mg by mouth every evening.   Yes Historical Provider, MD  valsartan (DIOVAN) 160 MG tablet Take 160 mg by mouth daily.   Yes Historical Provider, MD    Allergies:    Allergies  Allergen Reactions  . Oxycodone     Caused patient to go crazy  . Penicillins Rash    Social History:  Ambulatory independently Lives at home with husband   reports that she has never smoked. She does not have any smokeless tobacco history on file. She reports that she does not drink alcohol or use illicit drugs.   Family History: family history includes Diabetes Mellitus II in her father and Hypertension in her mother.    Physical Exam: Patient Vitals for the past 24 hrs:  BP Temp Temp src Pulse Resp SpO2  10/15/12 2100 122/44 mmHg - - 71 17 100 %  10/15/12 2000 122/55 mmHg - - 64 21 98 %  10/15/12 1930 122/54 mmHg - - 62 23 95 %  10/15/12 1830 116/75 mmHg - - 66 19 100 %  10/15/12 1745 131/67 mmHg - - 65 16 100 %  10/15/12 1715 124/55 mmHg - - 64 22 100 %  10/15/12 1642 136/67 mmHg - - 70 16 100 %  10/15/12 1549 128/74 mmHg 97.2 F (36.2 C) Oral 72 20 99 %    1. General:  in No Acute distress 2. Psychological: Alert and   Oriented 3. Head/ENT:   Moist   Mucous Membranes                          Head Non traumatic, neck supple                          Normal   Dentition 4. SKIN: normal  Skin turgor,  Skin clean Dry and intact no rash 5. Heart: Regular rate and rhythm no Murmur, Rub or gallop 6. Lungs: Clear to auscultation bilaterally, no wheezes or crackles   7. Abdomen: Soft, non-tender, Non distended 8. Lower extremities: no clubbing, cyanosis, or edema 9. Neurologically Grossly intact, moving all 4 extremities equally 10. MSK: Normal range of motion  body mass index is unknown because there is no height or weight on file.   Labs on Admission:   Recent Labs  10/15/12 1550  NA 138  K 4.0  CL 101  CO2 29  GLUCOSE 146*  BUN 37*  CREATININE 1.58*  CALCIUM 9.8   No results found for this basename: AST, ALT, ALKPHOS, BILITOT, PROT, ALBUMIN,  in the last 72 hours No results found for this basename: LIPASE, AMYLASE,  in the last 72  hours  Recent Labs  10/15/12 1550  WBC 5.2  NEUTROABS 3.9  HGB 10.7*  HCT 32.5*  MCV 88.6  PLT 197   No results found for this basename: CKTOTAL, CKMB, CKMBINDEX, TROPONINI,  in the last 72 hours No results found for this basename: TSH, T4TOTAL, FREET3, T3FREE, THYROIDAB,  in the last 72 hours No results found for this basename: VITAMINB12, FOLATE, FERRITIN, TIBC, IRON, RETICCTPCT,  in the last  72 hours No results found for this basename: HGBA1C    CrCl is unknown because there is no height on file for the current visit. ABG    Component Value Date/Time   HCO3 29.8* 02/06/2007 1442   TCO2 31 02/06/2007 1442     Lab Results  Component Value Date   DDIMER 11.00* 10/15/2012     Other results:  I have pearsonaly reviewed this: ECG REPORT  Rate: 72  Rhythm: NSR with PAC's ST&T Change: no ischemic changes  UA no evidence of infection  Cultures: No results found for this basename: sdes, specrequest, cult, reptstatus      Radiological Exams on Admission: Dg Chest 2 View  10/15/2012  *RADIOLOGY REPORT*  Clinical Data: Shortness of breath  CHEST - 2 VIEW  Comparison: 03/09/2006  Findings: Stable hyperinflation compatible with background COPD/emphysema.  Normal heart size and vascularity.  No focal pneumonia, collapse, consolidation, edema, effusion or pneumothorax.  Trachea midline.  Degenerative changes of the spine.  IMPRESSION: Stable hyperinflation.  No superimposed acute process   Original Report Authenticated By: Judie Petit. Miles Costain, M.D.    Ct Head Wo Contrast  10/15/2012  *RADIOLOGY REPORT*  Clinical Data: Confusion and memory loss  CT HEAD WITHOUT CONTRAST  Technique:  Contiguous axial images were obtained from the base of the skull through the vertex without contrast.  Comparison: None  Findings: Ventricle size is normal.  Negative for intracranial hemorrhage or acute infarct.  Negative for mass or edema.  No bony abnormality in the skull.  IMPRESSION: Negative   Original Report  Authenticated By: Janeece Riggers, M.D.    Ct Angio Chest Pe W/cm &/or Wo Cm  10/15/2012  *RADIOLOGY REPORT*  Clinical Data: .  SOB.  Near-syncope  CT ANGIOGRAPHY CHEST  Technique:  Multidetector CT imaging of the chest using the standard protocol during bolus administration of intravenous contrast. Multiplanar reconstructed images including MIPs were obtained and reviewed to evaluate the vascular anatomy.  Contrast: OMNIPAQUE IOHEXOL 350 MG/ML SOLN  Comparison: 10/15/2012  Findings: Pulmonary emboli are present in the right upper lobe and right lower lobe pulmonary arteries.  No emboli are seen on the left.  Heart size is normal.  No pericardial effusion.  Aortic diameter is normal with mild atherosclerotic disease.  Lungs are clear without infiltrate or effusion.  No mass or adenopathy is present.  No acute spinal abnormality.  IMPRESSION: Right-sided pulmonary emboli.  Moderate clot burden on the right.  I discussed the findings by telephone with Dr. Ranae Palms   Original Report Authenticated By: Janeece Riggers, M.D.     Chart has been reviewed  Assessment/Plan  79 yo w Hx of breast Cancer and traveling was found to have moderate burden PE.   Present on Admission:  . PE (pulmonary embolism) - Heparin per pharmacy, coumadin per pharmacy, and hyper coagulable panel ordered. Hx of Breast Ca and recent traveling are likely the risk factors in this patient. To further risk stratify will order echo, dopplers to evaluate for clot burden and trop . Dyspnea - due to PE . Diabetes mellitus - given recent hypoglycemia hold off on insuline and watch BG. SSI while inpatient . HTN (hypertension) - hold DIOvan for now and watch renal function . Hypoglycemia - frequent BG monitoring . CKD (chronic kidney disease) stage 3, GFR 30-59 ml/min - discussed patient with renal given administration of contrast media will watch Cr and consult them in AM if worsening renal function, for now IVF.    Prophylaxis: heparin,  coumadin   CODE STATUS: FULL CODE per patient  Other plan as per orders.  I have spent a total of 55 min on this admission  Lakisha Peyser 10/15/2012, 9:36 PM

## 2012-10-16 ENCOUNTER — Encounter (HOSPITAL_COMMUNITY): Payer: Self-pay | Admitting: *Deleted

## 2012-10-16 DIAGNOSIS — I2699 Other pulmonary embolism without acute cor pulmonale: Secondary | ICD-10-CM

## 2012-10-16 DIAGNOSIS — E162 Hypoglycemia, unspecified: Secondary | ICD-10-CM

## 2012-10-16 LAB — PROTIME-INR
INR: 1.21 (ref 0.00–1.49)
Prothrombin Time: 15.1 seconds (ref 11.6–15.2)

## 2012-10-16 LAB — CBC
MCHC: 33.6 g/dL (ref 30.0–36.0)
MCV: 87.2 fL (ref 78.0–100.0)
Platelets: 173 10*3/uL (ref 150–400)
RDW: 12.8 % (ref 11.5–15.5)
WBC: 6 10*3/uL (ref 4.0–10.5)

## 2012-10-16 LAB — COMPREHENSIVE METABOLIC PANEL
ALT: 8 U/L (ref 0–35)
Albumin: 3 g/dL — ABNORMAL LOW (ref 3.5–5.2)
Alkaline Phosphatase: 38 U/L — ABNORMAL LOW (ref 39–117)
BUN: 31 mg/dL — ABNORMAL HIGH (ref 6–23)
Chloride: 106 mEq/L (ref 96–112)
GFR calc Af Amer: 37 mL/min — ABNORMAL LOW (ref >90)
Glucose, Bld: 106 mg/dL — ABNORMAL HIGH (ref 70–99)
Potassium: 3.7 mEq/L (ref 3.5–5.1)
Sodium: 142 mEq/L (ref 135–145)
Total Bilirubin: 0.3 mg/dL (ref 0.3–1.2)

## 2012-10-16 LAB — TROPONIN I: Troponin I: <0.3 ng/mL (ref <0.30)

## 2012-10-16 LAB — GLUCOSE, CAPILLARY
Glucose-Capillary: 104 mg/dL — ABNORMAL HIGH (ref 70–99)
Glucose-Capillary: 120 mg/dL — ABNORMAL HIGH (ref 70–99)
Glucose-Capillary: 140 mg/dL — ABNORMAL HIGH (ref 70–99)

## 2012-10-16 LAB — HEPARIN LEVEL (UNFRACTIONATED): Heparin Unfractionated: 0.56 IU/mL (ref 0.30–0.70)

## 2012-10-16 LAB — HOMOCYSTEINE: Homocysteine: 14 umol/L (ref 4.0–15.4)

## 2012-10-16 MED ORDER — WARFARIN VIDEO
Freq: Once | Status: AC
  Administered 2012-10-16: 16:00:00

## 2012-10-16 MED ORDER — COUMADIN BOOK
Freq: Once | Status: AC
  Administered 2012-10-16: 08:00:00
  Filled 2012-10-16: qty 1

## 2012-10-16 MED ORDER — WARFARIN SODIUM 5 MG PO TABS
5.0000 mg | ORAL_TABLET | Freq: Once | ORAL | Status: AC
  Administered 2012-10-16: 5 mg via ORAL
  Filled 2012-10-16: qty 1

## 2012-10-16 NOTE — Progress Notes (Signed)
VASCULAR LAB PRELIMINARY  PRELIMINARY  PRELIMINARY  PRELIMINARY  Bilateral lower extremity venous duplex  completed.    Preliminary report:  Right:  No evidence of DVT or superficial thrombosis. Baker's cyst in the right popliteal space.  Left: DVT noted in the peroneal vein.  No evidence of superficial thrombosis.  No Baker's cyst.   Jesua Tamblyn, RVT 10/16/2012, 3:22 PM

## 2012-10-16 NOTE — Progress Notes (Signed)
Pt and family watching coumadin video; coumadin book given this am. Will continue to monitor.

## 2012-10-16 NOTE — Care Management Note (Unsigned)
    Page 1 of 1   10/18/2012     3:51:12 PM   CARE MANAGEMENT NOTE 10/18/2012  Patient:  Kristina [name], Kristina [name]   Account Number:  [id number]  Date Initiated:  10/16/2012  Documentation initiated by:  Junius Creamer  Subjective/Objective Assessment:   pul embolus     Action/Plan:   lives w husband, pcp dr Danise Edge   Anticipated DC Date:  10/21/2012   Anticipated DC Plan:  HOME/SELF CARE      DC Planning Services  CM consult      Choice offered to / List presented to:             Status of service:  In process, will continue to follow Medicare Important Message given?   (If response is "NO", the following Medicare IM given date fields will be blank) Date Medicare IM given:   Date Additional Medicare IM given:    Discharge Disposition:    Per UR Regulation:  Reviewed for med. necessity/level of care/duration of stay  If discussed at Long Length of Stay Meetings, dates discussed:    Comments:  10/18/12 Jahir Halt,RN,BSN [phone] PT ADM ON 10/15/12 WITH RT PE AND LT DVT.  PTA, [name]  PMH OF RECENT FALL.  MAY BENEFIT FROM PHYSICAL THERAPY CONSULT.  WILL FOLLOW.  3/18 0853 debbie dowell rn,bsn

## 2012-10-16 NOTE — Progress Notes (Signed)
ANTICOAGULATION CONSULT NOTE - Follow Up Consult  Pharmacy Consult for heparin and Coumadin Indication: pulmonary embolus  Labs:  Recent Labs  10/15/12 1550 10/15/12 2314 10/16/12 0400  HGB 10.7*  --   --   HCT 32.5*  --   --   PLT 197  --   --   LABPROT  --   --  15.1  INR  --   --  1.21  HEPARINUNFRC  --   --  0.70  CREATININE 1.58*  --   --   TROPONINI  --  <0.30  --     Assessment: 79yo female therapeutic on heparin with initial dosing for PE, at high end of goal but level drawn early and expect level to go down.  Also to begin Coumadin.  Goal of Therapy:  INR 2-3 Heparin level 0.3-0.7 units/ml   Plan:  Will continue heparin gtt at current rate and confirm stable with additional level.  Will give Coumadin 5mg  po x1 today and monitor INR for dose adjustments; will begin Coumadin education.  Vernard Gambles, PharmD, BCPS  10/16/2012,4:51 AM

## 2012-10-16 NOTE — Progress Notes (Addendum)
[facility] PROGRESS NOTE  Kristina [name] ZOX:[id number] DOB: 1930/10/07 DOA: 10/15/2012 PCP: Charolett Bumpers, MD  Brief Narrative: Kristina [name] is a 79 y.o. female with a past medical history of Diabetes mellitus; Hypertension; Renal disorder; and Cancer (2000).  Presented with 1 wk hx of intermittent episodes of dyspnea lasting few minutes at time. Denies any chest pain. Today the episode was so severe that she callupsed. She went to [place] and [place] week ago on a trip but have been traveling extensively even priror to this. She reports some swelling in her left leg and some pain bilaterally this has resolved but shortly after she started to have shortness of breath. She has hx of CKD with elevated Cr last Cr was 1.8. She did have CTA done today to evaluate her symptoms that showed Right side PE. Spoke to Dr. Caryn Section with renal who recommends at this point IVF and monitoring of Cr call Renal in AM if worsening reanal function. Of note in ER her blood sugar was noted to be down to 79 this was treated with D50. Patient has been NPO all day.   Assessment/Plan: PE (pulmonary embolism) - Heparin per pharmacy, coumadin per pharmacy, and hyper coagulable panel ordered. Hx of Breast Ca and recent traveling are likely the risk factors in this patient. To further risk stratify will order echo, dopplers to evaluate for clot burden and trop - have to use heparin/Coumadin due to renal function  Diabetes mellitus - given recent hypoglycemia hold off on insuline and watch BG. SSI while inpatient - good control  HTN (hypertension) - hold Diovan for now and watch renal function  Hypoglycemia - frequent BG monitoring - resolved  CKD (chronic kidney disease) stage 3, GFR 30-59 ml/min - discussed patient with renal given administration of contrast media will watch Cr and consult them in AM if worsening renal function, for now IVF.  - followed by Dr. Darrick Penna as an outpatient.  - will consult  Nephrology if renal function deteriorates.   Code Status: Full Family Communication: none  Disposition Plan: once therapeutic on Coumadin  Consultants:  none  Procedures:  none  Antibiotics:  none  HPI/Subjective: - no complaints this morning, denies shortness of breath  Objective: Filed Vitals:   10/15/12 2130 10/15/12 2200 10/15/12 2301 10/16/12 0409  BP: 141/66 132/59 144/52 126/49  Pulse: 91 69 69 65  Temp:   97.6 F (36.4 C) 97.9 F (36.6 C)  TempSrc:   Oral Oral  Resp: 21 24 18 18   Height: 5\' 5"  (1.651 m)  5\' 5"  (1.651 m)   Weight: 64.411 kg (142 lb)  65.4 kg (144 lb 2.9 oz)   SpO2: 100% 98% 97% 97%    Intake/Output Summary (Last 24 hours) at 10/16/12 1053 Last data filed at 10/16/12 0900  Gross per 24 hour  Intake   1905 ml  Output    750 ml  Net   1155 ml   Filed Weights   10/15/12 2130 10/15/12 2301  Weight: 64.411 kg (142 lb) 65.4 kg (144 lb 2.9 oz)    Exam:   General:  NAD  Cardiovascular: regular rate and rhythm, without MRG  Respiratory: good air movement, clear to auscultation throughout, no wheezing, ronchi or rales  Abdomen: soft, not tender to palpation, positive bowel sounds  MSK: no peripheral edema  Neuro: CN 2-12 grossly intact, MS 5/5 in all 4  Data Reviewed: Basic Metabolic Panel:  Recent Labs Lab 10/15/12 1550 10/16/12 0400  NA  138 142  K 4.0 3.7  CL 101 106  CO2 29 27  GLUCOSE 146* 106*  BUN 37* 31*  CREATININE 1.58* 1.50*  CALCIUM 9.8 9.1  MG  --  1.6  PHOS  --  2.4   Liver Function Tests:  Recent Labs Lab 10/16/12 0400  AST 15  ALT 8  ALKPHOS 38*  BILITOT 0.3  PROT 6.4  ALBUMIN 3.0*   CBC:  Recent Labs Lab 10/15/12 1550 10/16/12 0400  WBC 5.2 6.0  NEUTROABS 3.9  --   HGB 10.7* 9.4*  HCT 32.5* 28.0*  MCV 88.6 87.2  PLT 197 173   Cardiac Enzymes:  Recent Labs Lab 10/15/12 2314 10/16/12 0400  TROPONINI <0.30 <0.30   CBG:  Recent Labs Lab 10/15/12 1557 10/15/12 1917  10/15/12 2158 10/16/12 0014 10/16/12 0624  GLUCAP 149* 79 123* 172* 104*   Studies: Dg Chest 2 View  10/15/2012  *RADIOLOGY REPORT*  Clinical Data: Shortness of breath  CHEST - 2 VIEW  Comparison: 03/09/2006  Findings: Stable hyperinflation compatible with background COPD/emphysema.  Normal heart size and vascularity.  No focal pneumonia, collapse, consolidation, edema, effusion or pneumothorax.  Trachea midline.  Degenerative changes of the spine.  IMPRESSION: Stable hyperinflation.  No superimposed acute process   Original Report Authenticated By: Judie Petit. Miles Costain, M.D.    Ct Head Wo Contrast  10/15/2012  *RADIOLOGY REPORT*  Clinical Data: Confusion and memory loss  CT HEAD WITHOUT CONTRAST  Technique:  Contiguous axial images were obtained from the base of the skull through the vertex without contrast.  Comparison: None  Findings: Ventricle size is normal.  Negative for intracranial hemorrhage or acute infarct.  Negative for mass or edema.  No bony abnormality in the skull.  IMPRESSION: Negative   Original Report Authenticated By: Janeece Riggers, M.D.    Ct Angio Chest Pe W/cm &/or Wo Cm  10/15/2012  *RADIOLOGY REPORT*  Clinical Data: .  SOB.  Near-syncope  CT ANGIOGRAPHY CHEST  Technique:  Multidetector CT imaging of the chest using the standard protocol during bolus administration of intravenous contrast. Multiplanar reconstructed images including MIPs were obtained and reviewed to evaluate the vascular anatomy.  Contrast: OMNIPAQUE IOHEXOL 350 MG/ML SOLN  Comparison: 10/15/2012  Findings: Pulmonary emboli are present in the right upper lobe and right lower lobe pulmonary arteries.  No emboli are seen on the left.  Heart size is normal.  No pericardial effusion.  Aortic diameter is normal with mild atherosclerotic disease.  Lungs are clear without infiltrate or effusion.  No mass or adenopathy is present.  No acute spinal abnormality.  IMPRESSION: Right-sided pulmonary emboli.  Moderate clot burden on  the right.  I discussed the findings by telephone with Dr. Ranae Palms   Original Report Authenticated By: Janeece Riggers, M.D.    Scheduled Meds: . calcitRIOL  0.25 mcg Oral Daily  . docusate sodium  100 mg Oral BID  . insulin aspart  0-5 Units Subcutaneous QHS  . insulin aspart  0-9 Units Subcutaneous TID WC  . ketorolac  1 drop Left Eye QID  . pantoprazole  40 mg Oral Daily  . senna  1 tablet Oral BID  . sertraline  50 mg Oral Daily  . simvastatin  40 mg Oral QPM  . sodium chloride  3 mL Intravenous Q12H  . warfarin  5 mg Oral ONCE-1800  . warfarin   Does not apply Once  . Warfarin - Pharmacist Dosing Inpatient   Does not apply [phone]  Continuous Infusions: . heparin 1,000 Units/hr (10/15/12 2358)    Active Problems:   Dyspnea   Diabetes mellitus   HTN (hypertension)   PE (pulmonary embolism)   Hypoglycemia   CKD (chronic kidney disease) stage 3, GFR 30-59 ml/min  Pamella Pert, MD [facility] Pager [phone]. If 7 PM - 7 AM, please contact night-coverage at [email or web address], password [facility] 10/16/2012, 10:53 AM  LOS: 1 day

## 2012-10-16 NOTE — Progress Notes (Signed)
  Echocardiogram 2D Echocardiogram has been performed.  Kristina [name] 10/16/2012, 3:25 PM

## 2012-10-16 NOTE — Progress Notes (Signed)
ANTICOAGULATION CONSULT NOTE - Follow Up Consult  Pharmacy Consult for heparin and Coumadin Indication: pulmonary embolus  Labs:  Recent Labs  10/15/12 1550 10/15/12 2314 10/16/12 0400 10/16/12 1054 10/16/12 1100  HGB 10.7*  --  9.4*  --   --   HCT 32.5*  --  28.0*  --   --   PLT 197  --  173  --   --   LABPROT  --   --  15.1  --   --   INR  --   --  1.21  --   --   HEPARINUNFRC  --   --  0.70  --  0.56  CREATININE 1.58*  --  1.50*  --   --   TROPONINI  --  <0.30 <0.30 <0.30  --     Assessment: 79 y/o female patient therapeutic on heparin with initial dosing for PE. Coumadin already dosed for today.  Goal of Therapy:  INR 2-3 Heparin level 0.3-0.7 units/ml   Plan:  Continue heparin gtt at 1000 units/hr and f/u in am.  Verlene Mayer, PharmD, BCPS Pager [phone] 10/16/2012,1:00 PM

## 2012-10-16 NOTE — Progress Notes (Signed)
Problem: Acute pulmonary embolus  History: The patient is a 79 year old female admitted to the hospitalist service to evaluate and treat an acute pulmonary embolus.  Past medical and surgical history: Type 2 diabetes mellitus without proteinuria since 1995. No  diabetic retinopathy by ophthalmological exam October 10, 2011.Stage III chronic kidney disease. Hypertension since 2000. Hypercholesterolemia. Osteoporosis. Gastroesophageal reflux. Normal screening colonoscopy in 2003. Give tetanus booster in 2018. Give Pneumovax booster in 2015. Shingles vaccination given in 2008. Atypical chest pain with normal cardiac catheterization in 2000. Bilateral breast cancer leading to bilateral mastectomies in 2007. Anemia of chronic disease of evaluated in 2007. Gastroesophageal reflux. Colonic diverticulosis. Zenker's diverticulum discovered on upper GI x-ray series in 2000. Irritable bowel syndrome. Degenerative joint disease. Depression. Tonsillectomy. Multiple arthroscopic surgeries. Rotator cuff surgery. Bilateral mastectomies. Trigger finger release surgery.  Endocrinologist Dr. Reather Littler  Nephrologist Dr. Fayrene Fearing Deterding  Ophthalmologist Dr. Jethro Bolus  Primary care physician Dr. Danise Edge

## 2012-10-17 LAB — CBC
HCT: 28.5 % — ABNORMAL LOW (ref 36.0–46.0)
Hemoglobin: 9.5 g/dL — ABNORMAL LOW (ref 12.0–15.0)
MCH: 29.1 pg (ref 26.0–34.0)
MCHC: 33.3 g/dL (ref 30.0–36.0)
MCV: 87.4 fL (ref 78.0–100.0)
Platelets: 168 10*3/uL (ref 150–400)
RBC: 3.26 MIL/uL — ABNORMAL LOW (ref 3.87–5.11)
RDW: 13 % (ref 11.5–15.5)
WBC: 4.9 10*3/uL (ref 4.0–10.5)

## 2012-10-17 LAB — GLUCOSE, CAPILLARY
Glucose-Capillary: 123 mg/dL — ABNORMAL HIGH (ref 70–99)
Glucose-Capillary: 102 mg/dL — ABNORMAL HIGH (ref 70–99)
Glucose-Capillary: 119 mg/dL — ABNORMAL HIGH (ref 70–99)

## 2012-10-17 LAB — BASIC METABOLIC PANEL WITH GFR
BUN: 28 mg/dL — ABNORMAL HIGH (ref 6–23)
CO2: 25 meq/L (ref 19–32)
Calcium: 8.9 mg/dL (ref 8.4–10.5)
Chloride: 109 meq/L (ref 96–112)
Creatinine, Ser: 1.58 mg/dL — ABNORMAL HIGH (ref 0.50–1.10)
GFR calc Af Amer: 35 mL/min — ABNORMAL LOW
GFR calc non Af Amer: 30 mL/min — ABNORMAL LOW
Glucose, Bld: 131 mg/dL — ABNORMAL HIGH (ref 70–99)
Potassium: 4 meq/L (ref 3.5–5.1)
Sodium: 143 meq/L (ref 135–145)

## 2012-10-17 LAB — HEPARIN LEVEL (UNFRACTIONATED)
Heparin Unfractionated: 1.05 [IU]/mL — ABNORMAL HIGH (ref 0.30–0.70)
Heparin Unfractionated: 0.72 [IU]/mL — ABNORMAL HIGH (ref 0.30–0.70)
Heparin Unfractionated: 0.6 IU/mL (ref 0.30–0.70)

## 2012-10-17 LAB — PROTEIN S ACTIVITY: Protein S Activity: 93 % (ref 69–129)

## 2012-10-17 LAB — FACTOR 5 LEIDEN

## 2012-10-17 LAB — CARDIOLIPIN ANTIBODIES, IGG, IGM, IGA: Anticardiolipin IgG: 6 GPL U/mL — ABNORMAL LOW (ref <23)

## 2012-10-17 LAB — BETA-2-GLYCOPROTEIN I ABS, IGG/M/A
Beta-2-Glycoprotein I IgA: 9 A Units (ref <20)
Beta-2-Glycoprotein I IgM: 0 M Units (ref <20)

## 2012-10-17 LAB — LUPUS ANTICOAGULANT PANEL: PTT Lupus Anticoagulant: 30.1 secs (ref 28.0–43.0)

## 2012-10-17 MED ORDER — HEPARIN (PORCINE) IN NACL 100-0.45 UNIT/ML-% IJ SOLN
750.0000 [IU]/h | INTRAMUSCULAR | Status: AC
  Administered 2012-10-19 – 2012-10-20 (×2): 750 [IU]/h via INTRAVENOUS
  Filled 2012-10-17 (×4): qty 250

## 2012-10-17 MED ORDER — WARFARIN SODIUM 5 MG PO TABS
5.0000 mg | ORAL_TABLET | Freq: Once | ORAL | Status: AC
  Administered 2012-10-17: 5 mg via ORAL
  Filled 2012-10-17: qty 1

## 2012-10-17 MED ORDER — HEPARIN (PORCINE) IN NACL 100-0.45 UNIT/ML-% IJ SOLN
850.0000 [IU]/h | INTRAMUSCULAR | Status: DC
  Filled 2012-10-17: qty 250

## 2012-10-17 NOTE — Progress Notes (Signed)
ANTICOAGULATION CONSULT NOTE - Follow Up Consult  Pharmacy Consult for heparin and Coumadin Indication: pulmonary embolus  Labs:  Recent Labs  10/15/12 1550 10/15/12 2314 10/16/12 0400 10/16/12 1054  10/17/12 0420 10/17/12 0530 10/17/12 1559  HGB 10.7*  --  9.4*  --   --  9.5*  --   --   HCT 32.5*  --  28.0*  --   --  28.5*  --   --   PLT 197  --  173  --   --  168  --   --   LABPROT  --   --  15.1  --   --  14.8  --   --   INR  --   --  1.21  --   --  1.18  --   --   HEPARINUNFRC  --   --  0.70  --   < > 1.12* 1.05* 0.72*  CREATININE 1.58*  --  1.50*  --   --  1.58*  --   --   TROPONINI  --  <0.30 <0.30 <0.30  --   --   --   --   < > = values in this interval not displayed.  Assessment: 79 y/o female patient on D2 overlap of heparin / Coumadin for PE.   Heparin level (0.72) just above-goal on 850 units/hr after rate adjustment this morning. No bleeding issues noted per RN.   Goal of Therapy:  INR 2-3 Heparin level 0.3-0.7 units/ml   Plan:  1. Decrease IV heparin 750 units/hr.  2. Heparin level in 6 hours.  3. Coumadin 5mg  po today.   Sheppard Coil PharmD., BCPS Clinical Pharmacist Pager [phone] 10/17/2012 5:16 PM

## 2012-10-17 NOTE — Progress Notes (Signed)
[facility] PROGRESS NOTE  Kristina [name] ZOX:[id number] DOB: 1931-01-24 DOA: 10/15/2012 PCP: Charolett Bumpers, MD  Brief Narrative: CHRISTINE MORTON is a 79 y.o. female with a past medical history of Diabetes mellitus; Hypertension; Renal disorder; and Cancer (2000).  Presented with 1 wk hx of intermittent episodes of dyspnea lasting few minutes at time. Denies any chest pain. Today the episode was so severe that she callupsed. She went to [place] and [place] week ago on a trip but have been traveling extensively even priror to this. She reports some swelling in her left leg and some pain bilaterally this has resolved but shortly after she started to have shortness of breath. She has hx of CKD with elevated Cr last Cr was 1.8. She did have CTA done today to evaluate her symptoms that showed Right side PE. Spoke to Dr. Caryn Section with renal who recommends at this point IVF and monitoring of Cr call Renal in AM if worsening reanal function. Of note in ER her blood sugar was noted to be down to 79 this was treated with D50. Patient has been NPO all day.   Assessment/Plan: PE (pulmonary embolism) - Heparin per pharmacy, coumadin per pharmacy, and hyper coagulable panel ordered. Hx of Breast Ca and recent traveling are likely the risk factors in this patient.  - have to use heparin/Coumadin due to renal function - INR still low, continue Coumadin per pharmacy  Diabetes mellitus - given recent hypoglycemia hold off on insuline and watch BG. SSI while inpatient - good control  HTN (hypertension) - hold Diovan for now and watch renal function. Stable  Hypoglycemia - frequent BG monitoring - resolved  CKD (chronic kidney disease) stage 3, GFR 30-59 ml/min - discussed patient with renal given administration of contrast media will watch Cr and consult them in AM if worsening renal function, for now IVF.  - followed by Dr. Darrick Penna as an outpatient.  - will consult Nephrology if renal function  deteriorates.  - stable here  Code Status: Full Family Communication: husband bedside.  Disposition Plan: once therapeutic on Coumadin  Consultants:  none  Procedures:  2D echo Study Conclusions - Left ventricle: The cavity size was normal. Systolic function was normal. The estimated ejection fraction was in the range of 55% to 60%. Wall motion was normal; there were no regional wall motion abnormalities. - Mitral valve: Mild regurgitation directed centrally. - Atrial septum: No defect or patent foramen ovale was identified.  Antibiotics:  none  HPI/Subjective: - denies chest pain, breathing difficulties.   Objective: Filed Vitals:   10/16/12 1052 10/16/12 1345 10/16/12 2024 10/17/12 0330  BP: 123/56 123/45 127/46 136/51  Pulse: 71 70 66 64  Temp: 98.7 F (37.1 C) 98.3 F (36.8 C) 97.6 F (36.4 C) 97.9 F (36.6 C)  TempSrc: Oral Oral Oral Oral  Resp: 18 18 18 18   Height:      Weight:      SpO2: 99% 98% 99% 100%    Intake/Output Summary (Last 24 hours) at 10/17/12 0941 Last data filed at 10/17/12 0331  Gross per 24 hour  Intake    480 ml  Output   2000 ml  Net  -1520 ml   Filed Weights   10/15/12 2130 10/15/12 2301  Weight: 64.411 kg (142 lb) 65.4 kg (144 lb 2.9 oz)    Exam:   General:  NAD  Cardiovascular: regular rate and rhythm, without MRG  Respiratory: good air movement, clear to auscultation throughout, no wheezing, ronchi  or rales  Abdomen: soft, not tender to palpation, positive bowel sounds  MSK: no peripheral edema  Neuro: CN 2-12 grossly intact, MS 5/5 in all 4  Data Reviewed: Basic Metabolic Panel:  Recent Labs Lab 10/15/12 1550 10/16/12 0400 10/17/12 0420  NA 138 142 143  K 4.0 3.7 4.0  CL 101 106 109  CO2 29 27 25   GLUCOSE 146* 106* 131*  BUN 37* 31* 28*  CREATININE 1.58* 1.50* 1.58*  CALCIUM 9.8 9.1 8.9  MG  --  1.6  --   PHOS  --  2.4  --    Liver Function Tests:  Recent Labs Lab 10/16/12 0400  AST 15   ALT 8  ALKPHOS 38*  BILITOT 0.3  PROT 6.4  ALBUMIN 3.0*   CBC:  Recent Labs Lab 10/15/12 1550 10/16/12 0400 10/17/12 0420  WBC 5.2 6.0 4.9  NEUTROABS 3.9  --   --   HGB 10.7* 9.4* 9.5*  HCT 32.5* 28.0* 28.5*  MCV 88.6 87.2 87.4  PLT 197 173 168   Cardiac Enzymes:  Recent Labs Lab 10/15/12 2314 10/16/12 0400 10/16/12 1054  TROPONINI <0.30 <0.30 <0.30   CBG:  Recent Labs Lab 10/16/12 0624 10/16/12 1131 10/16/12 1629 10/16/12 2142 10/17/12 0601  GLUCAP 104* 120* 140* 174* 123*   Studies: Dg Chest 2 View  10/15/2012  *RADIOLOGY REPORT*  Clinical Data: Shortness of breath  CHEST - 2 VIEW  Comparison: 03/09/2006  Findings: Stable hyperinflation compatible with background COPD/emphysema.  Normal heart size and vascularity.  No focal pneumonia, collapse, consolidation, edema, effusion or pneumothorax.  Trachea midline.  Degenerative changes of the spine.  IMPRESSION: Stable hyperinflation.  No superimposed acute process   Original Report Authenticated By: Judie Petit. Miles Costain, M.D.    Ct Head Wo Contrast  10/15/2012  *RADIOLOGY REPORT*  Clinical Data: Confusion and memory loss  CT HEAD WITHOUT CONTRAST  Technique:  Contiguous axial images were obtained from the base of the skull through the vertex without contrast.  Comparison: None  Findings: Ventricle size is normal.  Negative for intracranial hemorrhage or acute infarct.  Negative for mass or edema.  No bony abnormality in the skull.  IMPRESSION: Negative   Original Report Authenticated By: Janeece Riggers, M.D.    Ct Angio Chest Pe W/cm &/or Wo Cm  10/15/2012  *RADIOLOGY REPORT*  Clinical Data: .  SOB.  Near-syncope  CT ANGIOGRAPHY CHEST  Technique:  Multidetector CT imaging of the chest using the standard protocol during bolus administration of intravenous contrast. Multiplanar reconstructed images including MIPs were obtained and reviewed to evaluate the vascular anatomy.  Contrast: OMNIPAQUE IOHEXOL 350 MG/ML SOLN  Comparison:  10/15/2012  Findings: Pulmonary emboli are present in the right upper lobe and right lower lobe pulmonary arteries.  No emboli are seen on the left.  Heart size is normal.  No pericardial effusion.  Aortic diameter is normal with mild atherosclerotic disease.  Lungs are clear without infiltrate or effusion.  No mass or adenopathy is present.  No acute spinal abnormality.  IMPRESSION: Right-sided pulmonary emboli.  Moderate clot burden on the right.  I discussed the findings by telephone with Dr. Ranae Palms   Original Report Authenticated By: Janeece Riggers, M.D.    Scheduled Meds: . calcitRIOL  0.25 mcg Oral Daily  . docusate sodium  100 mg Oral BID  . insulin aspart  0-5 Units Subcutaneous QHS  . insulin aspart  0-9 Units Subcutaneous TID WC  . ketorolac  1 drop Left Eye QID  .  pantoprazole  40 mg Oral Daily  . senna  1 tablet Oral BID  . sertraline  50 mg Oral Daily  . simvastatin  40 mg Oral QPM  . sodium chloride  3 mL Intravenous Q12H  . warfarin  5 mg Oral ONCE-1800  . Warfarin - Pharmacist Dosing Inpatient   Does not apply q1800   Continuous Infusions: . heparin 850 Units/hr (10/17/12 0730)    Active Problems:   Dyspnea   Diabetes mellitus   HTN (hypertension)   PE (pulmonary embolism)   Hypoglycemia   CKD (chronic kidney disease) stage 3, GFR 30-59 ml/min  Pamella Pert, MD [facility] Pager [phone]. If 7 PM - 7 AM, please contact night-coverage at [email or web address], password [facility] 10/17/2012, 9:41 AM  LOS: 2 days

## 2012-10-17 NOTE — Progress Notes (Signed)
ANTICOAGULATION CONSULT NOTE - Follow Up Consult  Pharmacy Consult for heparin and Coumadin Indication: pulmonary embolus  Labs:  Recent Labs  10/15/12 1550 10/15/12 2314  10/16/12 0400 10/16/12 1054 10/16/12 1100 10/17/12 0420 10/17/12 0530  HGB 10.7*  --   --  9.4*  --   --  9.5*  --   HCT 32.5*  --   --  28.0*  --   --  28.5*  --   PLT 197  --   --  173  --   --  168  --   LABPROT  --   --   --  15.1  --   --  14.8  --   INR  --   --   --  1.21  --   --  1.18  --   HEPARINUNFRC  --   --   < > 0.70  --  0.56 1.12* 1.05*  CREATININE 1.58*  --   --  1.50*  --   --  1.58*  --   TROPONINI  --  <0.30  --  <0.30 <0.30  --   --   --   < > = values in this interval not displayed.  Assessment: 79 y/o female patient on D2 overlap of heparin / Coumadin for PE.   Heparin level (1.12, 1.05) is above-goal on 1000 units/hr. Confirmed with RN lab drawn appropriately.   Goal of Therapy:  INR 2-3 Heparin level 0.3-0.7 units/ml   Plan:  1. Hold heparin x 1 hour, then restart IV heparin at 850 units/hr.  2. Heparin level in 8 hours after restart.  3. Coumadin 5mg  po today.   Lorre Munroe, PharmD 10/17/2012,6:14 AM

## 2012-10-18 LAB — CBC
Hemoglobin: 9.3 g/dL — ABNORMAL LOW (ref 12.0–15.0)
MCH: 28.8 pg (ref 26.0–34.0)
Platelets: 161 10*3/uL (ref 150–400)
RBC: 3.23 MIL/uL — ABNORMAL LOW (ref 3.87–5.11)
WBC: 4.1 10*3/uL (ref 4.0–10.5)

## 2012-10-18 LAB — PROTIME-INR: Prothrombin Time: 15.1 seconds (ref 11.6–15.2)

## 2012-10-18 LAB — GLUCOSE, CAPILLARY
Glucose-Capillary: 124 mg/dL — ABNORMAL HIGH (ref 70–99)
Glucose-Capillary: 126 mg/dL — ABNORMAL HIGH (ref 70–99)
Glucose-Capillary: 114 mg/dL — ABNORMAL HIGH (ref 70–99)

## 2012-10-18 LAB — BASIC METABOLIC PANEL
CO2: 26 mEq/L (ref 19–32)
Calcium: 9 mg/dL (ref 8.4–10.5)
Chloride: 109 mEq/L (ref 96–112)
Glucose, Bld: 123 mg/dL — ABNORMAL HIGH (ref 70–99)
Sodium: 144 mEq/L (ref 135–145)

## 2012-10-18 LAB — HEPARIN LEVEL (UNFRACTIONATED): Heparin Unfractionated: 0.54 IU/mL (ref 0.30–0.70)

## 2012-10-18 LAB — PROTEIN C, TOTAL: Protein C, Total: 116 % (ref 72–160)

## 2012-10-18 MED ORDER — WARFARIN SODIUM 7.5 MG PO TABS
7.5000 mg | ORAL_TABLET | Freq: Once | ORAL | Status: AC
  Administered 2012-10-18: 7.5 mg via ORAL
  Filled 2012-10-18: qty 1

## 2012-10-18 NOTE — Progress Notes (Addendum)
[facility] PROGRESS NOTE  Kristina [name]:[id number] DOB: May 29, 1931 DOA: 10/15/2012 PCP: Charolett Bumpers, MD  Brief Narrative: Kristina [name] is a 79 y.o. female with a past medical history of Diabetes mellitus; Hypertension; Renal disorder; and Cancer (2000). Presented with 1 wk hx of intermittent episodes of dyspnea lasting few minutes at time. Denies any chest pain. Today the episode was so severe that she callupsed. She went to [place] and [place] week ago on a trip but have been traveling extensively even priror to this. She reports some swelling in her left leg and some pain bilaterally this has resolved but shortly after she started to have shortness of breath. She has hx of CKD with elevated Cr last Cr was 1.8. She did have CTA done today to evaluate her symptoms that showed Right side PE. Spoke to Dr. Caryn Section with renal who recommends at this point IVF and monitoring of Cr call Renal in AM if worsening reanal function. Of note in ER her blood sugar was noted to be down to 79 this was treated with D50. Patient has been NPO all day.   Assessment/Plan: PE (pulmonary embolism) - Heparin per pharmacy, coumadin per pharmacy, and hyper coagulable panel ordered. Hx of Breast Ca and recent traveling are likely the risk factors in this patient.  - have to use heparin/Coumadin due to renal function - INR still low, continue Coumadin per pharmacy, likely higher dose needed tonight.   Diabetes mellitus - given recent hypoglycemia hold off on insuline and watch BG. SSI while inpatient - good control  HTN (hypertension) - hold Diovan for now and watch renal function. Stable  Hypoglycemia - frequent BG monitoring - resolved  CKD (chronic kidney disease) stage 3, GFR 30-59 ml/min - discussed patient with renal given administration of contrast media will watch Cr and consult them in AM if worsening renal function, for now IVF.  - followed by Dr. Darrick Penna as an outpatient.  - will consult  Nephrology if renal function deteriorates.  - stable here - monitoring  Code Status: Full Family Communication: husband bedside.  Disposition Plan: once therapeutic on Coumadin  Consultants:  none  Procedures:  2D echo Study Conclusions - Left ventricle: The cavity size was normal. Systolic function was normal. The estimated ejection fraction was in the range of 55% to 60%. Wall motion was normal; there were no regional wall motion abnormalities. - Mitral valve: Mild regurgitation directed centrally. - Atrial septum: No defect or patent foramen ovale was identified.  Antibiotics:  none  HPI/Subjective:  - feeling well this morning   Objective: Filed Vitals:   10/17/12 1330 10/17/12 2040 10/18/12 0427 10/18/12 1345  BP: 128/56 122/54 124/62 122/52  Pulse: 74 63 60 69  Temp: 98.1 F (36.7 C) 98.4 F (36.9 C) 97.3 F (36.3 C) 98.3 F (36.8 C)  TempSrc: Oral Oral Oral Oral  Resp: 18 18 18 18   Height:      Weight:      SpO2: 99% 100% 100% 97%    Intake/Output Summary (Last 24 hours) at 10/18/12 1521 Last data filed at 10/18/12 1300  Gross per 24 hour  Intake    720 ml  Output   1400 ml  Net   -680 ml   Filed Weights   10/15/12 2130 10/15/12 2301  Weight: 64.411 kg (142 lb) 65.4 kg (144 lb 2.9 oz)    Exam:   General:  NAD  Cardiovascular: regular rate and rhythm, without MRG  Respiratory: good air  movement, clear to auscultation throughout, no wheezing, ronchi or rales  Abdomen: soft, not tender to palpation, positive bowel sounds  MSK: no peripheral edema  Neuro: CN 2-12 grossly intact, MS 5/5 in all 4  Data Reviewed: Basic Metabolic Panel:  Recent Labs Lab 10/15/12 1550 10/16/12 0400 10/17/12 0420 10/18/12 0530  NA 138 142 143 144  K 4.0 3.7 4.0 4.0  CL 101 106 109 109  CO2 29 27 25 26   GLUCOSE 146* 106* 131* 123*  BUN 37* 31* 28* 23  CREATININE 1.58* 1.50* 1.58* 1.51*  CALCIUM 9.8 9.1 8.9 9.0  MG  --  1.6  --   --   PHOS  --   2.4  --   --    Liver Function Tests:  Recent Labs Lab 10/16/12 0400  AST 15  ALT 8  ALKPHOS 38*  BILITOT 0.3  PROT 6.4  ALBUMIN 3.0*   CBC:  Recent Labs Lab 10/15/12 1550 10/16/12 0400 10/17/12 0420 10/18/12 0530  WBC 5.2 6.0 4.9 4.1  NEUTROABS 3.9  --   --   --   HGB 10.7* 9.4* 9.5* 9.3*  HCT 32.5* 28.0* 28.5* 27.6*  MCV 88.6 87.2 87.4 85.4  PLT 197 173 168 161   Cardiac Enzymes:  Recent Labs Lab 10/15/12 2314 10/16/12 0400 10/16/12 1054  TROPONINI <0.30 <0.30 <0.30   CBG:  Recent Labs Lab 10/17/12 1132 10/17/12 1640 10/17/12 2106 10/18/12 0608 10/18/12 1128  GLUCAP 111* 102* 119* 124* 118*   Studies: No results found. Scheduled Meds: . calcitRIOL  0.25 mcg Oral Daily  . docusate sodium  100 mg Oral BID  . insulin aspart  0-5 Units Subcutaneous QHS  . insulin aspart  0-9 Units Subcutaneous TID WC  . ketorolac  1 drop Left Eye QID  . pantoprazole  40 mg Oral Daily  . senna  1 tablet Oral BID  . sertraline  50 mg Oral Daily  . simvastatin  40 mg Oral QPM  . sodium chloride  3 mL Intravenous Q12H  . warfarin  7.5 mg Oral ONCE-1800  . Warfarin - Pharmacist Dosing Inpatient   Does not apply q1800   Continuous Infusions: . heparin 750 Units/hr (10/17/12 1715)    Active Problems:   Dyspnea   Diabetes mellitus   HTN (hypertension)   PE (pulmonary embolism)   Hypoglycemia   CKD (chronic kidney disease) stage 3, GFR 30-59 ml/min  Pamella Pert, MD [facility] Pager [phone]. If 7 PM - 7 AM, please contact night-coverage at [email or web address], password [facility] 10/18/2012, 3:21 PM  LOS: 3 days

## 2012-10-18 NOTE — Progress Notes (Signed)
ANTICOAGULATION CONSULT NOTE - Follow Up Consult  Pharmacy Consult for heparin and Coumadin Indication: pulmonary embolus  Labs:  Recent Labs  10/15/12 2314 10/16/12 0400 10/16/12 1054  10/17/12 0420  10/17/12 1559 10/17/12 2232 10/18/12 0530  HGB  --  9.4*  --   --  9.5*  --   --   --  9.3*  HCT  --  28.0*  --   --  28.5*  --   --   --  27.6*  PLT  --  173  --   --  168  --   --   --  161  LABPROT  --  15.1  --   --  14.8  --   --   --  15.1  INR  --  1.21  --   --  1.18  --   --   --  1.21  HEPARINUNFRC  --  0.70  --   < > 1.12*  < > 0.72* 0.60 0.54  CREATININE  --  1.50*  --   --  1.58*  --   --   --  1.51*  TROPONINI <0.30 <0.30 <0.30  --   --   --   --   --   --   < > = values in this interval not displayed.  Assessment: 79 y/o female patient on day #3/5 minimum overlap of heparin / Coumadin for PE.   Xa level is therapeutic on 750 units/hr. INR remains subtherapeutic and not moving, will increase dose.  Goal of Therapy:  INR 2-3 Heparin level 0.3-0.7 units/ml   Plan:  Continue heparin gtt at 750 units/hr, coumadin 7.5mg  today and f/u daily labs.  Verlene Mayer, PharmD, BCPS Pager [phone]  10/18/2012 10:22 AM

## 2012-10-18 NOTE — Progress Notes (Signed)
ANTICOAGULATION CONSULT NOTE - Follow Up Consult  Pharmacy Consult for heparin and Coumadin Indication: pulmonary embolus  Labs:  Recent Labs  10/15/12 1550 10/15/12 2314 10/16/12 0400 10/16/12 1054  10/17/12 0420 10/17/12 0530 10/17/12 1559 10/17/12 2232  HGB 10.7*  --  9.4*  --   --  9.5*  --   --   --   HCT 32.5*  --  28.0*  --   --  28.5*  --   --   --   PLT 197  --  173  --   --  168  --   --   --   LABPROT  --   --  15.1  --   --  14.8  --   --   --   INR  --   --  1.21  --   --  1.18  --   --   --   HEPARINUNFRC  --   --  0.70  --   < > 1.12* 1.05* 0.72* 0.60  CREATININE 1.58*  --  1.50*  --   --  1.58*  --   --   --   TROPONINI  --  <0.30 <0.30 <0.30  --   --   --   --   --   < > = values in this interval not displayed.  Assessment: 79 y/o female patient on D2 overlap of heparin / Coumadin for PE.   Heparin level (0.60) is at-goal on 750 units/hr.   Goal of Therapy:  INR 2-3 Heparin level 0.3-0.7 units/ml   Plan:  1. Continue IV heparin at 750 units/hr.  2. Daily CBC, heparin level  Lorre Munroe, PharmD 10/18/2012 12:06 AM

## 2012-10-19 DIAGNOSIS — I82409 Acute embolism and thrombosis of unspecified deep veins of unspecified lower extremity: Secondary | ICD-10-CM | POA: Diagnosis present

## 2012-10-19 LAB — BASIC METABOLIC PANEL
CO2: 25 mEq/L (ref 19–32)
Calcium: 9.1 mg/dL (ref 8.4–10.5)
Chloride: 109 mEq/L (ref 96–112)
Glucose, Bld: 130 mg/dL — ABNORMAL HIGH (ref 70–99)
Potassium: 4.1 mEq/L (ref 3.5–5.1)
Sodium: 144 mEq/L (ref 135–145)

## 2012-10-19 LAB — PROTIME-INR: Prothrombin Time: 17.8 seconds — ABNORMAL HIGH (ref 11.6–15.2)

## 2012-10-19 LAB — GLUCOSE, CAPILLARY
Glucose-Capillary: 130 mg/dL — ABNORMAL HIGH (ref 70–99)
Glucose-Capillary: 86 mg/dL (ref 70–99)

## 2012-10-19 LAB — CBC
Hemoglobin: 9.4 g/dL — ABNORMAL LOW (ref 12.0–15.0)
MCH: 29 pg (ref 26.0–34.0)
RBC: 3.24 MIL/uL — ABNORMAL LOW (ref 3.87–5.11)
WBC: 4.6 10*3/uL (ref 4.0–10.5)

## 2012-10-19 MED ORDER — ACETAMINOPHEN 650 MG RE SUPP: 650.0000 mg | Freq: Four times a day (QID) | RECTAL | Status: DC | PRN

## 2012-10-19 MED ORDER — WARFARIN SODIUM 7.5 MG PO TABS
7.5000 mg | ORAL_TABLET | Freq: Once | ORAL | Status: AC
  Administered 2012-10-19: 7.5 mg via ORAL
  Filled 2012-10-19: qty 1

## 2012-10-19 MED ORDER — ACETAMINOPHEN 325 MG PO TABS: 650.0000 mg | ORAL_TABLET | ORAL | Status: DC | PRN

## 2012-10-19 NOTE — Progress Notes (Signed)
[facility] PROGRESS NOTE  Kristina [name] EAV:[id number] DOB: Oct 25, 1930 DOA: 10/15/2012 PCP: Charolett Bumpers, MD  Brief Narrative:  79 y.o. female with a past medical history of Diabetes mellitus; Hypertension; CK D.; breast cancer status post bilateral mastectomy Presented with 1 wk hx of intermittent dyspnea lasting few minutes at time. Denies any chest pain. On the day of admission it was so severe that she collapsed. She went to [place] and [place] a week ago on a trip but have been traveling extensively even priror to this. She reports some swelling in her left leg and some pain bilaterally this has resolved but shortly after she started to have shortness of breath. A CT angiogram of the chest done in the ED was suggestive of right-sided PE with moderate clot burden. Doppler of the legs positive for left peroneal  DVT.  Assessment/Plan:   Acute PE with left leg DVT - History of breast cancer and recent traveling are likely risk factors. Patient started on heparin drip and Coumadin as renal function abnormal. -Hypercoagulable workup negative except for mildly elevated protein C. - INR slowly improving. Dosing per pharmacy - Will be discharged on Coumadin once INR therapeutic. Currently does not have any symptoms. -Ordered Tylenol for left leg pain.  Diabetes mellitus  -her blood glucose was low on presentation and home medications held. Continue sliding scale insulin     hypertension Diovan held as abnormal renal function.    CKD (chronic kidney disease) stage 3,  Creatinine seems to be at baseline. Patient did receive IV contrast on admission. Holding Diovan for now Follows with Dr. Darrick Penna as outpatient.     Code Status: Full code Family Communication: Husband at bedside Disposition Plan: Home once INR therapeutic   Consultants:  None  Procedures:  None  Antibiotics:  None  HPI/Subjective: Denies any chest pain or shortness of breath. Complains  of pain over the left popliteal area.  Objective: Filed Vitals:   10/18/12 1345 10/18/12 2041 10/19/12 0514 10/19/12 1340  BP: 122/52 131/60 114/65 120/62  Pulse: 69 63 61 65  Temp: 98.3 F (36.8 C) 98.1 F (36.7 C) 97.7 F (36.5 C) 98 F (36.7 C)  TempSrc: Oral Oral Oral Oral  Resp: 18 18 16 18   Height:      Weight:      SpO2: 97% 100% 99% 98%    Intake/Output Summary (Last 24 hours) at 10/19/12 1425 Last data filed at 10/19/12 1300  Gross per 24 hour  Intake    720 ml  Output   1401 ml  Net   -681 ml   Filed Weights   10/15/12 2130 10/15/12 2301  Weight: 64.411 kg (142 lb) 65.4 kg (144 lb 2.9 oz)    Exam:   General:  Elderly female in no acute distress  HEENT: No pallor, moist oral mucosa  Cardiovascular: Normal S1 and S2, no murmurs rub or gallop  Respiratory: Clear to auscultation bilaterally, no added sounds  Abdomen: Soft, nontender, nondistended, bowel sounds present  Musculoskeletal: Warm, some tenderness over the left popliteal area, no swelling  CNS: AAO x3  Data Reviewed: Basic Metabolic Panel:  Recent Labs Lab 10/15/12 1550 10/16/12 0400 10/17/12 0420 10/18/12 0530 10/19/12 0450  NA 138 142 143 144 144  K 4.0 3.7 4.0 4.0 4.1  CL 101 106 109 109 109  CO2 29 27 25 26 25   GLUCOSE 146* 106* 131* 123* 130*  BUN 37* 31* 28* 23 24*  CREATININE 1.58* 1.50* 1.58* 1.51*  1.54*  CALCIUM 9.8 9.1 8.9 9.0 9.1  MG  --  1.6  --   --   --   PHOS  --  2.4  --   --   --    Liver Function Tests:  Recent Labs Lab 10/16/12 0400  AST 15  ALT 8  ALKPHOS 38*  BILITOT 0.3  PROT 6.4  ALBUMIN 3.0*   No results found for this basename: LIPASE, AMYLASE,  in the last 168 hours No results found for this basename: AMMONIA,  in the last 168 hours CBC:  Recent Labs Lab 10/15/12 1550 10/16/12 0400 10/17/12 0420 10/18/12 0530 10/19/12 0450  WBC 5.2 6.0 4.9 4.1 4.6  NEUTROABS 3.9  --   --   --   --   HGB 10.7* 9.4* 9.5* 9.3* 9.4*  HCT 32.5* 28.0*  28.5* 27.6* 27.7*  MCV 88.6 87.2 87.4 85.4 85.5  PLT 197 173 168 161 173   Cardiac Enzymes:  Recent Labs Lab 10/15/12 2314 10/16/12 0400 10/16/12 1054  TROPONINI <0.30 <0.30 <0.30   BNP (last 3 results) No results found for this basename: PROBNP,  in the last 8760 hours CBG:  Recent Labs Lab 10/18/12 1128 10/18/12 1627 10/18/12 2134 10/19/12 0630 10/19/12 1119  GLUCAP 118* 126* 114* 130* 86    No results found for this or any previous visit (from the past 240 hour(s)).   Studies: No results found.  Scheduled Meds: . calcitRIOL  0.25 mcg Oral Daily  . docusate sodium  100 mg Oral BID  . insulin aspart  0-5 Units Subcutaneous QHS  . insulin aspart  0-9 Units Subcutaneous TID WC  . ketorolac  1 drop Left Eye QID  . pantoprazole  40 mg Oral Daily  . senna  1 tablet Oral BID  . sertraline  50 mg Oral Daily  . simvastatin  40 mg Oral QPM  . sodium chloride  3 mL Intravenous Q12H  . warfarin  7.5 mg Oral ONCE-1800  . Warfarin - Pharmacist Dosing Inpatient   Does not apply q1800   Continuous Infusions: . heparin 750 Units/hr (10/19/12 1010)      Time spent: 25 minutes    Domnick Chervenak  [facility] Pager [phone]. If 7PM-7AM, please contact night-coverage at [email or web address], password [facility] 10/19/2012, 2:25 PM  LOS: 4 days

## 2012-10-19 NOTE — Progress Notes (Signed)
ANTICOAGULATION CONSULT NOTE - Follow Up Consult  Pharmacy Consult for coumadin and heparin Indication: pulmonary embolus  Allergies  Allergen Reactions  . Oxycodone     Caused patient to go crazy  . Penicillins Rash    Patient Measurements: Height: 5\' 5"  (165.1 cm) Weight: 144 lb 2.9 oz (65.4 kg) IBW/kg (Calculated) : 57 Heparin Dosing Weight:   Vital Signs: Temp: 97.7 F (36.5 C) (03/21 0514) Temp src: Oral (03/21 0514) BP: 114/65 mmHg (03/21 0514) Pulse Rate: 61 (03/21 0514)  Labs:  Recent Labs  10/16/12 1054  10/17/12 0420  10/17/12 2232 10/18/12 0530 10/19/12 0450  HGB  --   < > 9.5*  --   --  9.3* 9.4*  HCT  --   --  28.5*  --   --  27.6* 27.7*  PLT  --   --  168  --   --  161 173  LABPROT  --   --  14.8  --   --  15.1 17.8*  INR  --   --  1.18  --   --  1.21 1.51*  HEPARINUNFRC  --   < > 1.12*  < > 0.60 0.54 0.48  CREATININE  --   --  1.58*  --   --  1.51* 1.54*  TROPONINI <0.30  --   --   --   --   --   --   < > = values in this interval not displayed.  Estimated Creatinine Clearance: 26.7 ml/min (by C-G formula based on Cr of 1.54).   Medications:  Scheduled:  . calcitRIOL  0.25 mcg Oral Daily  . docusate sodium  100 mg Oral BID  . insulin aspart  0-5 Units Subcutaneous QHS  . insulin aspart  0-9 Units Subcutaneous TID WC  . ketorolac  1 drop Left Eye QID  . pantoprazole  40 mg Oral Daily  . senna  1 tablet Oral BID  . sertraline  50 mg Oral Daily  . simvastatin  40 mg Oral QPM  . sodium chloride  3 mL Intravenous Q12H  . [COMPLETED] warfarin  7.5 mg Oral ONCE-1800  . Warfarin - Pharmacist Dosing Inpatient   Does not apply q1800   Infusions:  . heparin 750 Units/hr (10/17/12 1715)    Assessment: 79 yo female with PE is currently on therapeutic heparin and subtherapeutic coumadin.  Day 4/5 of VTE overlap.  Heparin level is 0.48 and INR up to 1.51 Goal of Therapy:  INR 2-3; heparin level 0.3-0.7 Monitor platelets by anticoagulation  protocol: Yes   Plan:  1) Continue heparin at 750 units/hr 2) Coumadin 7.5mg  po x1 3) INR, CBC and heparin level in am  Merrell Borsuk, Tsz-Yin 10/19/2012,8:44 AM

## 2012-10-20 LAB — CBC
HCT: 28.4 % — ABNORMAL LOW (ref 36.0–46.0)
Hemoglobin: 9.3 g/dL — ABNORMAL LOW (ref 12.0–15.0)
MCV: 87.7 fL (ref 78.0–100.0)
RBC: 3.24 MIL/uL — ABNORMAL LOW (ref 3.87–5.11)
WBC: 4.9 10*3/uL (ref 4.0–10.5)

## 2012-10-20 LAB — GLUCOSE, CAPILLARY: Glucose-Capillary: 116 mg/dL — ABNORMAL HIGH (ref 70–99)

## 2012-10-20 LAB — BASIC METABOLIC PANEL
BUN: 25 mg/dL — ABNORMAL HIGH (ref 6–23)
CO2: 24 mEq/L (ref 19–32)
Chloride: 107 mEq/L (ref 96–112)
Creatinine, Ser: 1.49 mg/dL — ABNORMAL HIGH (ref 0.50–1.10)
GFR calc Af Amer: 37 mL/min — ABNORMAL LOW (ref >90)
Potassium: 4.3 mEq/L (ref 3.5–5.1)

## 2012-10-20 LAB — PROTIME-INR: INR: 2.19 — ABNORMAL HIGH (ref 0.00–1.49)

## 2012-10-20 MED ORDER — WARFARIN SODIUM 5 MG PO TABS
5.0000 mg | ORAL_TABLET | Freq: Once | ORAL | Status: AC
  Administered 2012-10-20: 5 mg via ORAL
  Filled 2012-10-20: qty 1

## 2012-10-20 NOTE — Progress Notes (Signed)
[facility] PROGRESS NOTE  Kristina [name] ZOX:[id number] DOB: 25-Apr-1931 DOA: 10/15/2012 PCP: Charolett Bumpers, MD  Brief Narrative:  79 y.o. female with a past medical history of Diabetes mellitus; Hypertension; CK D.; breast cancer status post bilateral mastectomy Presented with 1 wk hx of intermittent dyspnea lasting few minutes at time. Denies any chest pain. On the day of admission it was so severe that she collapsed. She went to [place] and [place] a week ago on a trip but have been traveling extensively even priror to this. She reports some swelling in her left leg and some pain bilaterally this has resolved but shortly after she started to have shortness of breath. A CT angiogram of the chest done in the ED was suggestive of right-sided PE with moderate clot burden. Doppler of the legs positive for left peroneal DVT.   Assessment/Plan:  Acute PE with left leg DVT  - History of breast cancer and recent traveling are likely risk factors. Patient started on heparin drip and Coumadin as renal function abnormal.  -Hypercoagulable workup negative except for mildly elevated protein C.  - INR therapeutic today. Will bridge with IV heparin for one more day. - discharge on coumadin tomorrow if remains therapeutic.   Diabetes mellitus  -her blood glucose was low on presentation and home medications held. Continue sliding scale insulin   hypertension  Diovan held as abnormal renal function.     CKD (chronic kidney disease) stage 3,  Creatinine seems to be at baseline. Patient did receive IV contrast on admission. Holding Diovan for now  Follows with Dr. Darrick Penna as outpatient.    Code Status: Full code  Family Communication: Husband at bedside  Disposition Plan: Home tomorrow Consultants:  None Procedures:  None Antibiotics:  None   HPI/Subjective:  No overnight issues   Objective: Filed Vitals:   10/19/12 0514 10/19/12 1340 10/19/12 1943 10/20/12 0429  BP:  114/65 120/62 105/55 101/55  Pulse: 61 65 66 63  Temp: 97.7 F (36.5 C) 98 F (36.7 C) 98.2 F (36.8 C) 97.6 F (36.4 C)  TempSrc: Oral Oral Oral Oral  Resp: 16 18 16 17   Height:      Weight:      SpO2: 99% 98% 98% 96%    Intake/Output Summary (Last 24 hours) at 10/20/12 1153 Last data filed at 10/19/12 2345  Gross per 24 hour  Intake    240 ml  Output   1050 ml  Net   -810 ml   Filed Weights   10/15/12 2130 10/15/12 2301  Weight: 64.411 kg (142 lb) 65.4 kg (144 lb 2.9 oz)    Exam:  General: Elderly female in no acute distress  HEENT: No pallor, moist oral mucosa  Cardiovascular: Normal S1 and S2, no murmurs rub or gallop  Respiratory: Clear to auscultation bilaterally, no added sounds  Abdomen: Soft, nontender, nondistended, bowel sounds present  Musculoskeletal: Warm, , no swelling  CNS: AAO x3   Data Reviewed: Basic Metabolic Panel:  Recent Labs Lab 10/15/12 1550 10/16/12 0400 10/17/12 0420 10/18/12 0530 10/19/12 0450 10/20/12 0600  NA 138 142 143 144 144 142  K 4.0 3.7 4.0 4.0 4.1 4.3  CL 101 106 109 109 109 107  CO2 29 27 25 26 25 24   GLUCOSE 146* 106* 131* 123* 130* 137*  BUN 37* 31* 28* 23 24* 25*  CREATININE 1.58* 1.50* 1.58* 1.51* 1.54* 1.49*  CALCIUM 9.8 9.1 8.9 9.0 9.1 9.3  MG  --  1.6  --   --   --   --  PHOS  --  2.4  --   --   --   --    Liver Function Tests:  Recent Labs Lab 10/16/12 0400  AST 15  ALT 8  ALKPHOS 38*  BILITOT 0.3  PROT 6.4  ALBUMIN 3.0*   No results found for this basename: LIPASE, AMYLASE,  in the last 168 hours No results found for this basename: AMMONIA,  in the last 168 hours CBC:  Recent Labs Lab 10/15/12 1550 10/16/12 0400 10/17/12 0420 10/18/12 0530 10/19/12 0450 10/20/12 0600  WBC 5.2 6.0 4.9 4.1 4.6 4.9  NEUTROABS 3.9  --   --   --   --   --   HGB 10.7* 9.4* 9.5* 9.3* 9.4* 9.3*  HCT 32.5* 28.0* 28.5* 27.6* 27.7* 28.4*  MCV 88.6 87.2 87.4 85.4 85.5 87.7  PLT 197 173 168 161 173 182    Cardiac Enzymes:  Recent Labs Lab 10/15/12 2314 10/16/12 0400 10/16/12 1054  TROPONINI <0.30 <0.30 <0.30   BNP (last 3 results) No results found for this basename: PROBNP,  in the last 8760 hours CBG:  Recent Labs Lab 10/19/12 1119 10/19/12 1632 10/19/12 2039 10/20/12 0622 10/20/12 1103  GLUCAP 86 95 142* 129* 116*    No results found for this or any previous visit (from the past 240 hour(s)).   Studies: No results found.  Scheduled Meds: . calcitRIOL  0.25 mcg Oral Daily  . docusate sodium  100 mg Oral BID  . insulin aspart  0-5 Units Subcutaneous QHS  . insulin aspart  0-9 Units Subcutaneous TID WC  . ketorolac  1 drop Left Eye QID  . pantoprazole  40 mg Oral Daily  . senna  1 tablet Oral BID  . sertraline  50 mg Oral Daily  . simvastatin  40 mg Oral QPM  . sodium chloride  3 mL Intravenous Q12H  . warfarin  5 mg Oral ONCE-1800  . Warfarin - Pharmacist Dosing Inpatient   Does not apply q1800   Continuous Infusions: . heparin 750 Units/hr (10/19/12 1010)      Time spent:25 minutes    Malakye Nolden  [facility] Pager [phone] If 7PM-7AM, please contact night-coverage at [email or web address], password [facility] 10/20/2012, 11:53 AM  LOS: 5 days

## 2012-10-20 NOTE — Progress Notes (Signed)
ANTICOAGULATION CONSULT NOTE - Follow Up Consult  Pharmacy Consult for heparin and coumadin Indication: pulmonary embolus  Allergies  Allergen Reactions  . Oxycodone     Caused patient to go crazy  . Penicillins Rash    Patient Measurements: Height: 5\' 5"  (165.1 cm) Weight: 144 lb 2.9 oz (65.4 kg) IBW/kg (Calculated) : 57 Heparin Dosing Weight:   Vital Signs: Temp: 97.6 F (36.4 C) (03/22 0429) Temp src: Oral (03/22 0429) BP: 101/55 mmHg (03/22 0429) Pulse Rate: 63 (03/22 0429)  Labs:  Recent Labs  10/18/12 0530 10/19/12 0450 10/20/12 0600  HGB 9.3* 9.4* 9.3*  HCT 27.6* 27.7* 28.4*  PLT 161 173 182  LABPROT 15.1 17.8* 23.4*  INR 1.21 1.51* 2.19*  HEPARINUNFRC 0.54 0.48 0.34  CREATININE 1.51* 1.54* 1.49*    Estimated Creatinine Clearance: 27.5 ml/min (by C-G formula based on Cr of 1.49).   Medications:  Scheduled:  . calcitRIOL  0.25 mcg Oral Daily  . docusate sodium  100 mg Oral BID  . insulin aspart  0-5 Units Subcutaneous QHS  . insulin aspart  0-9 Units Subcutaneous TID WC  . ketorolac  1 drop Left Eye QID  . pantoprazole  40 mg Oral Daily  . senna  1 tablet Oral BID  . sertraline  50 mg Oral Daily  . simvastatin  40 mg Oral QPM  . sodium chloride  3 mL Intravenous Q12H  . warfarin  5 mg Oral ONCE-1800  . [COMPLETED] warfarin  7.5 mg Oral ONCE-1800  . Warfarin - Pharmacist Dosing Inpatient   Does not apply q1800   Infusions:  . heparin 750 Units/hr (10/19/12 1010)    Assessment: 79 yo female with PE is currently on therapeutic heparin and therapeutic coumadin.  Heparin level was 0.34 and INR up to 2.19.  CBC stable. Goal of Therapy:  INR 2-3 and 8hr heparin level 0.3-0.7 Monitor platelets by anticoagulation protocol: Yes   Plan:  1) Continue heparin at 750 units/hr until midnight today 2) Coumadin 5mg  po x1 3) D/c daily heparin level 4) INR in am  Reegan Mctighe, Tsz-Yin 10/20/2012,10:51 AM

## 2012-10-21 LAB — CBC
HCT: 27.7 % — ABNORMAL LOW (ref 36.0–46.0)
Hemoglobin: 9.4 g/dL — ABNORMAL LOW (ref 12.0–15.0)
MCV: 85.8 fL (ref 78.0–100.0)
Platelets: 172 10*3/uL (ref 150–400)
RBC: 3.23 MIL/uL — ABNORMAL LOW (ref 3.87–5.11)
WBC: 4.5 10*3/uL (ref 4.0–10.5)

## 2012-10-21 LAB — GLUCOSE, CAPILLARY: Glucose-Capillary: 124 mg/dL — ABNORMAL HIGH (ref 70–99)

## 2012-10-21 LAB — CREATININE, SERUM: Creatinine, Ser: 1.55 mg/dL — ABNORMAL HIGH (ref 0.50–1.10)

## 2012-10-21 MED ORDER — WARFARIN SODIUM 3 MG PO TABS: 3.0000 mg | ORAL_TABLET | Freq: Every day | ORAL | Status: DC

## 2012-10-21 MED ORDER — AMLODIPINE BESYLATE 5 MG PO TABS: 2.5000 mg | ORAL_TABLET | Freq: Every day | ORAL | Status: DC

## 2012-10-21 NOTE — Discharge Summary (Signed)
Physician Discharge Summary  Kristina [name] QQV:[id number] DOB: Jun 15, 1931 DOA: 10/15/2012  PCP: Kristina Bumpers, MD  Admit date: 10/15/2012 Discharge date: 10/21/2012  Time spent: 40 minutes  Recommendations for Outpatient Follow-up:  Home with outpatient PCP followup for INR monitoring in 2 days. Follow up with renal as outpatient  Discharge Diagnoses:  Principal Problem:   PE (pulmonary embolism)  Active Problems: DVT, left lower extremity   Dyspnea   Diabetes mellitus   HTN (hypertension)   Hypoglycemia   CKD (chronic kidney disease) stage 3, GFR 30-59 ml/min      Discharge Condition: Fair  Diet recommendation: Diabetic  Filed Weights   10/15/12 2130 10/15/12 2301  Weight: 64.411 kg (142 lb) 65.4 kg (144 lb 2.9 oz)    History of present illness:  Reason for admission H&P for details but in brief, 79 y.o. female with a past medical history of Diabetes mellitus; Hypertension; CK D.; breast cancer status post bilateral mastectomy Presented with 1 wk hx of intermittent dyspnea lasting few minutes at time. Denies any chest pain. On the day of admission it was so severe that she collapsed. She went to [place] and [place] a week ago on a trip but have been traveling extensively even priror to this. She reports some swelling in her left leg and some pain bilaterally this has resolved but shortly after she started to have shortness of breath. A CT angiogram of the chest done in the ED was suggestive of right-sided PE with moderate clot burden. Doppler of the legs positive for left peroneal DVT.    Hospital Course:    Acute PE with left leg DVT  - History of breast cancer and recent traveling are likely risk factors. Patient started on heparin drip and Coumadin given underlying CKD. -Hypercoagulable workup negative except for mildly elevated protein C.  - INR has been therapeutic for past 2 days. Stop IV heparin.  -Her INR is 3.1 today and I will discharge her on low dose  of Coumadin 3 mg and she needs to followup with her PCP in 2 days and have her INR checked and dose adjusted. -Will need at least 6 months of anticoagulation. -Recommend repeating left lower extremity Doppler after completion of anticoagulation.   Diabetes mellitus  -her blood glucose was low on presentation and home medications held. Currently stable and resumed upon discharge. -When A1c of 7.2  hypertension  . Medications were held with concern for her renal function. Blood pressure have been stable. I will resume her Diovan but hold her HCTZ.    CKD (chronic kidney disease) stage 3,  Creatinine seems to be at baseline. Follows with Dr. Darrick Penna as outpatient.    Code Status: Full code   Family Communication: Husband at bedside   Disposition Plan: Home on low dose Coumadin with outpatient followup with PCP in 2 days  Consultants:  None Procedures:  None Antibiotics:  None     Consultations:    Discharge Exam: Filed Vitals:   10/20/12 0429 10/20/12 1427 10/20/12 2005 10/21/12 0426  BP: 101/55 115/53 120/62 120/72  Pulse: 63 65 69 65  Temp: 97.6 F (36.4 C) 98.1 F (36.7 C) 97.1 F (36.2 C) 97.2 F (36.2 C)  TempSrc: Oral Oral Oral Oral  Resp: 17 18 18 19   Height:      Weight:      SpO2: 96% 99% 96% 96%    General: Elderly female in no acute distress  HEENT: No pallor, moist oral mucosa  Cardiovascular: Normal S1 and S2, no murmurs rub or gallop  Respiratory: Clear to auscultation bilaterally, no added sounds  Abdomen: Soft, nontender, nondistended, bowel sounds present  Musculoskeletal: Warm, , no swelling  CNS: AAO x3   Discharge Instructions     Medication List    STOP taking these medications       hydrochlorothiazide 25 MG tablet  Commonly known as:  HYDRODIURIL      TAKE these medications       acetaminophen 500 MG tablet  Commonly known as:  TYLENOL  Take 1,000 mg by mouth every 6 (six) hours as needed for pain.               calcitRIOL 0.25 MCG capsule  Commonly known as:  ROCALTROL  Take 0.25 mcg by mouth daily.     insulin lispro protamine-insulin lispro (75-25) 100 UNIT/ML Susp  Commonly known as:  HUMALOG 75/25  Inject 8-10 Units into the skin 2 (two) times daily. Uses 10 units in the AM and 8 units at bedtime     ketorolac 0.5 % ophthalmic solution  Commonly known as:  ACULAR  Place 1 drop into the left eye 4 (four) times daily.     omeprazole 20 MG capsule  Commonly known as:  PRILOSEC  Take 20 mg by mouth daily.     sertraline 50 MG tablet  Commonly known as:  ZOLOFT  Take 50 mg by mouth daily.     simvastatin 40 MG tablet  Commonly known as:  ZOCOR  Take 40 mg by mouth every evening.     valsartan 160 MG tablet  Commonly known as:  DIOVAN  Take 160 mg by mouth daily.     warfarin 3 MG tablet  Commonly known as:  COUMADIN  Take 1 tablet (3 mg total) by mouth daily.           Follow-up Information   Follow up with Kristina Bumpers, MD In 2 days. (needs INR checked and coumadin dose adjusted)    Contact information:   [address], [place] [address] [phone]        The results of significant diagnostics from this hospitalization (including imaging, microbiology, ancillary and laboratory) are listed below for reference.    Significant Diagnostic Studies: Dg Chest 2 View  10/15/2012  *RADIOLOGY REPORT*  Clinical Data: Shortness of breath  CHEST - 2 VIEW  Comparison: 03/09/2006  Findings: Stable hyperinflation compatible with background COPD/emphysema.  Normal heart size and vascularity.  No focal pneumonia, collapse, consolidation, edema, effusion or pneumothorax.  Trachea midline.  Degenerative changes of the spine.  IMPRESSION: Stable hyperinflation.  No superimposed acute process   Original Report Authenticated By: Judie Petit. Miles Costain, M.D.    Ct Head Wo Contrast  10/15/2012  *RADIOLOGY REPORT*  Clinical Data: Confusion and  memory loss  CT HEAD WITHOUT CONTRAST  Technique:  Contiguous axial images were obtained from the base of the skull through the vertex without contrast.  Comparison: None  Findings: Ventricle size is normal.  Negative for intracranial hemorrhage or acute infarct.  Negative for mass or edema.  No bony abnormality in the skull.  IMPRESSION: Negative   Original Report Authenticated By: Janeece Riggers, M.D.    Ct Angio Chest Pe W/cm &/or Wo Cm  10/15/2012  *RADIOLOGY REPORT*  Clinical Data: .  SOB.  Near-syncope  CT ANGIOGRAPHY CHEST  Technique:  Multidetector CT imaging of the chest using the standard protocol during bolus administration of intravenous contrast. Multiplanar reconstructed images including MIPs were obtained and reviewed to evaluate the vascular anatomy.  Contrast: OMNIPAQUE IOHEXOL 350 MG/ML SOLN  Comparison: 10/15/2012  Findings: Pulmonary emboli are present in the right upper lobe and right lower lobe pulmonary arteries.  No emboli are seen on the left.  Heart size is normal.  No pericardial effusion.  Aortic diameter is normal with mild atherosclerotic disease.  Lungs are clear without infiltrate or effusion.  No mass or adenopathy is present.  No acute spinal abnormality.  IMPRESSION: Right-sided pulmonary emboli.  Moderate clot burden on the right.  I discussed the findings by telephone with Dr. Ranae Palms   Original Report Authenticated By: Janeece Riggers, M.D.     Microbiology: No results found for this or any previous visit (from the past 240 hour(s)).   Labs: Basic Metabolic Panel:  Recent Labs Lab 10/15/12 1550 10/16/12 0400 10/17/12 0420 10/18/12 0530 10/19/12 0450 10/20/12 0600 10/21/12 0801  NA 138 142 143 144 144 142  --   K 4.0 3.7 4.0 4.0 4.1 4.3  --   CL 101 106 109 109 109 107  --   CO2 29 27 25 26 25 24   --   GLUCOSE 146* 106* 131* 123* 130* 137*  --   BUN 37* 31* 28* 23 24* 25*  --   CREATININE 1.58* 1.50* 1.58* 1.51* 1.54* 1.49* 1.55*  CALCIUM 9.8 9.1 8.9  9.0 9.1 9.3  --   MG  --  1.6  --   --   --   --   --   PHOS  --  2.4  --   --   --   --   --    Liver Function Tests:  Recent Labs Lab 10/16/12 0400  AST 15  ALT 8  ALKPHOS 38*  BILITOT 0.3  PROT 6.4  ALBUMIN 3.0*   No results found for this basename: LIPASE, AMYLASE,  in the last 168 hours No results found for this basename: AMMONIA,  in the last 168 hours CBC:  Recent Labs Lab 10/15/12 1550  10/17/12 0420 10/18/12 0530 10/19/12 0450 10/20/12 0600 10/21/12 0545  WBC 5.2  < > 4.9 4.1 4.6 4.9 4.5  NEUTROABS 3.9  --   --   --   --   --   --   HGB 10.7*  < > 9.5* 9.3* 9.4* 9.3* 9.4*  HCT 32.5*  < > 28.5* 27.6* 27.7* 28.4* 27.7*  MCV 88.6  < > 87.4 85.4 85.5 87.7 85.8  PLT 197  < > 168 161 173 182 172  < > = values in this interval not displayed. Cardiac Enzymes:  Recent Labs Lab 10/15/12 2314 10/16/12 0400 10/16/12 1054  TROPONINI <0.30 <0.30 <0.30   BNP: BNP (last 3 results) No results found for this basename: PROBNP,  in the last 8760 hours CBG:  Recent Labs Lab 10/20/12 0622 10/20/12 1103 10/20/12 1603 10/20/12 2039 10/21/12 0626  GLUCAP 129* 116* 114* 170* 124*       Signed:  Teara Duerksen  [facility] 10/21/2012, 9:49 AM

## 2012-10-21 NOTE — Progress Notes (Signed)
ANTICOAGULATION CONSULT NOTE - Follow Up Consult  Pharmacy Consult for coumadin Indication: pulmonary embolus  Allergies  Allergen Reactions  . Oxycodone     Caused patient to go crazy  . Penicillins Rash    Patient Measurements: Height: 5\' 5"  (165.1 cm) Weight: 144 lb 2.9 oz (65.4 kg) IBW/kg (Calculated) : 57 Heparin Dosing Weight:   Vital Signs: Temp: 97.2 F (36.2 C) (03/23 0426) Temp src: Oral (03/23 0426) BP: 120/72 mmHg (03/23 0426) Pulse Rate: 65 (03/23 0426)  Labs:  Recent Labs  10/19/12 0450 10/20/12 0600 10/21/12 0545 10/21/12 0801  HGB 9.4* 9.3* 9.4*  --   HCT 27.7* 28.4* 27.7*  --   PLT 173 182 172  --   LABPROT 17.8* 23.4* 30.3*  --   INR 1.51* 2.19* 3.10*  --   HEPARINUNFRC 0.48 0.34  --   --   CREATININE 1.54* 1.49*  --  1.55*    Estimated Creatinine Clearance: 26.5 ml/min (by C-G formula based on Cr of 1.55).   Medications:  Scheduled:  . calcitRIOL  0.25 mcg Oral Daily  . docusate sodium  100 mg Oral BID  . insulin aspart  0-5 Units Subcutaneous QHS  . insulin aspart  0-9 Units Subcutaneous TID WC  . ketorolac  1 drop Left Eye QID  . pantoprazole  40 mg Oral Daily  . senna  1 tablet Oral BID  . sertraline  50 mg Oral Daily  . simvastatin  40 mg Oral QPM  . sodium chloride  3 mL Intravenous Q12H  . [COMPLETED] warfarin  5 mg Oral ONCE-1800  . Warfarin - Pharmacist Dosing Inpatient   Does not apply q1800   Infusions:  . [EXPIRED] heparin 750 Units/hr (10/20/12 2257)    Assessment: 79 yo female with PE is currently on supratherapeutic coumadin.  INR up to 3.1 Goal of Therapy:  INR 2-3    Plan:  1) Noted patient is being discharged today.  Rec to have no coumadin today and have INR recheck tomorrow to reassess dosing.  Jaber Dunlow, Tsz-Yin 10/21/2012,10:45 AM

## 2013-03-01 ENCOUNTER — Ambulatory Visit (INDEPENDENT_AMBULATORY_CARE_PROVIDER_SITE_OTHER): Payer: Medicare Other | Admitting: Endocrinology

## 2013-03-01 ENCOUNTER — Encounter: Payer: Self-pay | Admitting: Endocrinology

## 2013-03-01 VITALS — BP 118/76 | HR 64 | Temp 97.9°F | Resp 10 | Ht 65.5 in | Wt 149.8 lb

## 2013-03-01 DIAGNOSIS — E7800 Pure hypercholesterolemia, unspecified: Secondary | ICD-10-CM

## 2013-03-01 DIAGNOSIS — E119 Type 2 diabetes mellitus without complications: Secondary | ICD-10-CM

## 2013-03-01 DIAGNOSIS — N183 Chronic kidney disease, stage 3 unspecified: Secondary | ICD-10-CM

## 2013-03-01 LAB — LIPID PANEL
Total CHOL/HDL Ratio: 3
Triglycerides: 127 mg/dL (ref 0.0–149.0)

## 2013-03-01 LAB — MICROALBUMIN / CREATININE URINE RATIO: Microalb Creat Ratio: 12.1 mg/g (ref 0.0–30.0)

## 2013-03-01 LAB — COMPREHENSIVE METABOLIC PANEL
ALT: 16 U/L (ref 0–35)
AST: 22 U/L (ref 0–37)
Calcium: 9.6 mg/dL (ref 8.4–10.5)
Chloride: 102 mEq/L (ref 96–112)
Creatinine, Ser: 1.5 mg/dL — ABNORMAL HIGH (ref 0.4–1.2)

## 2013-03-01 LAB — HEMOGLOBIN A1C: Hgb A1c MFr Bld: 7.8 % — ABNORMAL HIGH (ref 4.6–6.5)

## 2013-03-01 NOTE — Progress Notes (Signed)
Patient ID: Kristina [name], female   DOB: 1931-05-29, 79 y.o.   MRN: [id number]  Reason for Appointment: Diabetes follow-up   History of Present Illness   Diagnosis: Type 2 diabetes mellitus, date of diagnosis: 15-20 years ago   She has not been seen in the office since 05/2012 She has been on small doses of premixed insulin twice a day for quite sometime. She again did not bring her monitor for download and blood sugar patterns are not reviewed. On her last visit her A1c was improved and blood sugars were mostly good.  Mostly trying to check her blood sugar before breakfast and supper. However she appears to have poor recall of her blood sugars and not clear what they are doing She is trying to take her insulin before eating as directed. Overall control appears to be fairly good for her age without any hypoglycemia.  Complications: are: peripheral neuropathy, burning, tingling .  Monitors blood glucose: Once a day.  Glucometer:?  Blood Glucose readings: Before breakfast: 155 today, pm 140 recently and does not remember other readings.  Hypoglycemia frequency: Never.  Meals: 3 meals per day, sometimes will have only cereal for breakfast.  Physical activity: exercise: riding exercise bike for 15 minutes (less) or walking.  Certified Diabetes Educator visit: 8/12.  Dietician visit: Most recent: 11/12.   The last HbgA1c was done in 10/13 and was 6.4.  The insulin regimen is described as premixed insulin 10 units in the morning and 8 units at supper.   Hyperlipidemia:  Target LDL < 100.  Medications simvastatin, compliant with this.  Labs LDL - previously 118  Renal Insufficiency:   She has had chronic renal insufficiency with the last creatinine 1.6  She has secondary hyperparathyroidism. PTH on the last visit was 81 with the target level less than 70.     Medication List       This list is accurate as of: 03/01/13 11:59 PM.  Always use your most recent med list.                acetaminophen 500 MG tablet  Commonly known as:  TYLENOL  Take 1,000 mg by mouth every 6 (six) hours as needed for pain.     insulin lispro protamine-lispro (75-25) 100 UNIT/ML Susp injection  Commonly known as:  HUMALOG 75/25  Inject 8-10 Units into the skin 2 (two) times daily. Uses 10 units in the AM and 8 units at supper     omeprazole 20 MG capsule  Commonly known as:  PRILOSEC  Take 20 mg by mouth daily.     sertraline 50 MG tablet  Commonly known as:  ZOLOFT  Take 50 mg by mouth daily.     simvastatin 40 MG tablet  Commonly known as:  ZOCOR  Take 40 mg by mouth every evening.     valsartan 160 MG tablet  Commonly known as:  DIOVAN  Take 160 mg by mouth daily.     warfarin 3 MG tablet  Commonly known as:  COUMADIN  Take 1 tablet (3 mg total) by mouth daily.     warfarin 1 MG tablet  Commonly known as:  COUMADIN  Take 1 mg by mouth as directed.        Allergies:  Allergies  Allergen Reactions  . Cortisone   . Oxycodone     Caused patient to go crazy  . Penicillins Rash    Past Medical History  Diagnosis Date  . Diabetes mellitus   .  Hypertension   . Renal disorder   . Cancer 2000    sp bilateral masectomy, sp chemo    Past Surgical History  Procedure Laterality Date  . Breast surgery    . Mastecomy      Family History  Problem Relation Age of Onset  . Hypertension Mother   . Diabetes Mellitus II Father     Social History:  reports that she has never smoked. She does not have any smokeless tobacco history on file. She reports that she does not drink alcohol or use illicit drugs.  Review of Systems  Has history of hypertension Positive history of depression He is on Coumadin for history of DVT and PE   Examination:   BP 118/76  Pulse 64  Temp(Src) 97.9 F (36.6 C)  Resp 10  Ht 5' 5.5" (1.664 m)  Wt 149 lb 12.8 oz (67.949 kg)  BMI 24.54 kg/m2  SpO2 98%  Body mass index is 24.54 kg/(m^2).   Assesment:   Diabetes type 2,  uncontrolled - 250.02  The patient's diabetes control will need to be assessed by A1c today. However she thinks her morning sugars are relatively higher now Again difficult to assess her readings that she does not bring her meter Also not clear if she is checking any readings after supper Her diet appears to be fairly good although she has gained 8 pounds in the last few months   PLAN:   1. To check home blood sugars on waking up or 2 hours after any meal  2. Walking or other exercise for up to 30 minutes every day.  3. Medication changes:   increase evening dose to 10 units also   [name] 03/04/2013, 5:20 PM   Addendum: LDL is high. She is supposed to be on 40 mg of simvastatin. If she is taking this daily will need to change her to 40 mg of atorvastatin  Office Visit on 03/01/2013  Component Date Value Range Status  . Cholesterol 03/01/2013 206* 0 - 200 mg/dL Final   ATP III Classification       Desirable:  < 200 mg/dL               Borderline High:  200 - 239 mg/dL          High:  > = 161 mg/dL  . Triglycerides 03/01/2013 127.0  0.0 - 149.0 mg/dL Final   Normal:  <096 mg/dLBorderline High:  150 - 199 mg/dL  . HDL 03/01/2013 63.00  >39.00 mg/dL Final  . VLDL 04/54/0981 25.4  0.0 - 40.0 mg/dL Final  . Total CHOL/HDL Ratio 03/01/2013 3   Final                  Men          Women1/2 Average Risk     3.4          3.3Average Risk          5.0          4.42X Average Risk          9.6          7.13X Average Risk          15.0          11.0                      . Hemoglobin A1C 03/01/2013 7.8* 4.6 - 6.5 % Final   Glycemic Control Guidelines  for People with Diabetes:Non Diabetic:  <6%Goal of Therapy: <7%Additional Action Suggested:  >8%   . Sodium 03/01/2013 139  135 - 145 mEq/L Final  . Potassium 03/01/2013 4.5  3.5 - 5.1 mEq/L Final  . Chloride 03/01/2013 102  96 - 112 mEq/L Final  . CO2 03/01/2013 30  19 - 32 mEq/L Final  . Glucose, Bld 03/01/2013 147* 70 - 99 mg/dL Final  . BUN  16/05/9603 24* 6 - 23 mg/dL Final  . Creatinine, Ser 03/01/2013 1.5* 0.4 - 1.2 mg/dL Final  . Total Bilirubin 03/01/2013 0.5  0.3 - 1.2 mg/dL Final  . Alkaline Phosphatase 03/01/2013 44  39 - 117 U/L Final  . AST 03/01/2013 22  0 - 37 U/L Final  . ALT 03/01/2013 16  0 - 35 U/L Final  . Total Protein 03/01/2013 7.2  6.0 - 8.3 g/dL Final  . Albumin 54/04/8118 3.6  3.5 - 5.2 g/dL Final  . Calcium 14/78/2956 9.6  8.4 - 10.5 mg/dL Final  . GFR 21/30/8657 44.36* >60.00 mL/min Final  . Microalb, Ur 03/01/2013 11.5* 0.0 - 1.9 mg/dL Final  . Creatinine,U 84/69/6295 94.8   Final  . Microalb Creat Ratio 03/01/2013 12.1  0.0 - 30.0 mg/g Final  . Direct LDL 03/01/2013 123.8   Final   Optimal:  <100 mg/dLNear or Above Optimal:  100-129 mg/dLBorderline High:  130-159 mg/dLHigh:  160-189 mg/dLVery High:  >190 mg/dL

## 2013-03-01 NOTE — Patient Instructions (Addendum)
Please check blood sugars at least half the time about 2-3 hours after any meal and as directed on waking up.  Please bring blood sugar monitor to each visit  Take 10 units before supper also  Add a protein with breakfast

## 2013-03-04 LAB — LDL CHOLESTEROL, DIRECT: Direct LDL: 123.8 mg/dL

## 2013-03-05 ENCOUNTER — Telehealth: Payer: Self-pay | Admitting: *Deleted

## 2013-03-05 NOTE — Telephone Encounter (Signed)
Message left to return call.

## 2013-03-05 NOTE — Telephone Encounter (Signed)
Message copied by Hermenia Bers on Tue Mar 05, 2013  3:24 PM ------      Message from: Reather Littler      Created: Mon Mar 04, 2013  5:20 PM       Cholesterol is too high      She is supposed to be on 40 mg of simvastatin. If she is taking this daily we'll need to change her to 40 mg of atorvastatin ------

## 2013-03-12 ENCOUNTER — Telehealth: Payer: Self-pay | Admitting: *Deleted

## 2013-03-12 NOTE — Telephone Encounter (Signed)
Her cholesterol is too high, she needs to go back to simvastatin if she has not been taking it. It has nothing to do with her having a fall

## 2013-03-12 NOTE — Telephone Encounter (Signed)
Message copied by Hermenia Bers on Tue Mar 12, 2013  2:16 PM ------      Message from: Reather Littler      Created: Mon Mar 04, 2013  5:20 PM       Cholesterol is too high      She is supposed to be on 40 mg of simvastatin. If she is taking this daily we'll need to change her to 40 mg of atorvastatin ------

## 2013-03-12 NOTE — Telephone Encounter (Signed)
Patient states Dr Kristina [name] told her to stop taking her Pravastin about 3 weeks ago. She states she had fallen, hit her eye, had to get stitches and had a blood clot. She is not making a lot of sense.

## 2013-03-15 ENCOUNTER — Telehealth: Payer: Self-pay | Admitting: *Deleted

## 2013-03-15 NOTE — Telephone Encounter (Signed)
She is aware 

## 2013-03-20 ENCOUNTER — Telehealth: Payer: Self-pay | Admitting: *Deleted

## 2013-03-20 NOTE — Telephone Encounter (Signed)
Pt is aware.  

## 2013-03-20 NOTE — Telephone Encounter (Signed)
Message copied by Hermenia Bers on Wed Mar 20, 2013 10:16 AM ------      Message from: Reather Littler      Created: Mon Mar 04, 2013  5:20 PM       Cholesterol is too high      She is supposed to be on 40 mg of simvastatin. If she is taking this daily we'll need to change her to 40 mg of atorvastatin ------

## 2013-03-22 ENCOUNTER — Emergency Department (HOSPITAL_COMMUNITY)
Admission: EM | Admit: 2013-03-22 | Discharge: 2013-03-22 | Disposition: A | Discharge status: Home / Self Care | Payer: Medicare Other | Attending: Emergency Medicine | Admitting: Emergency Medicine

## 2013-03-22 ENCOUNTER — Emergency Department (HOSPITAL_COMMUNITY): Payer: Medicare Other

## 2013-03-22 ENCOUNTER — Encounter (HOSPITAL_COMMUNITY): Payer: Self-pay | Admitting: *Deleted

## 2013-03-22 DIAGNOSIS — Z794 Long term (current) use of insulin: Secondary | ICD-10-CM | POA: Insufficient documentation

## 2013-03-22 DIAGNOSIS — E119 Type 2 diabetes mellitus without complications: Secondary | ICD-10-CM | POA: Insufficient documentation

## 2013-03-22 DIAGNOSIS — Z87448 Personal history of other diseases of urinary system: Secondary | ICD-10-CM | POA: Insufficient documentation

## 2013-03-22 DIAGNOSIS — R0789 Other chest pain: Secondary | ICD-10-CM | POA: Insufficient documentation

## 2013-03-22 DIAGNOSIS — Z880 Allergy status to penicillin: Secondary | ICD-10-CM | POA: Insufficient documentation

## 2013-03-22 DIAGNOSIS — Z79899 Other long term (current) drug therapy: Secondary | ICD-10-CM | POA: Insufficient documentation

## 2013-03-22 DIAGNOSIS — I10 Essential (primary) hypertension: Secondary | ICD-10-CM | POA: Insufficient documentation

## 2013-03-22 DIAGNOSIS — Z853 Personal history of malignant neoplasm of breast: Secondary | ICD-10-CM | POA: Insufficient documentation

## 2013-03-22 DIAGNOSIS — Z7901 Long term (current) use of anticoagulants: Secondary | ICD-10-CM | POA: Insufficient documentation

## 2013-03-22 LAB — COMPREHENSIVE METABOLIC PANEL
Alkaline Phosphatase: 43 U/L (ref 39–117)
BUN: 30 mg/dL — ABNORMAL HIGH (ref 6–23)
Chloride: 103 mEq/L (ref 96–112)
GFR calc Af Amer: 35 mL/min — ABNORMAL LOW (ref >90)
Glucose, Bld: 116 mg/dL — ABNORMAL HIGH (ref 70–99)
Potassium: 4 mEq/L (ref 3.5–5.1)
Total Bilirubin: 0.3 mg/dL (ref 0.3–1.2)

## 2013-03-22 LAB — POCT I-STAT TROPONIN I: Troponin i, poc: 0.02 ng/mL (ref 0.00–0.08)

## 2013-03-22 LAB — CBC WITH DIFFERENTIAL/PLATELET
Hemoglobin: 10.5 g/dL — ABNORMAL LOW (ref 12.0–15.0)
Lymphocytes Relative: 34 % (ref 12–46)
Lymphs Abs: 1.5 10*3/uL (ref 0.7–4.0)
Monocytes Relative: 10 % (ref 3–12)
Neutro Abs: 2.3 10*3/uL (ref 1.7–7.7)
Neutrophils Relative %: 54 % (ref 43–77)
RBC: 3.61 MIL/uL — ABNORMAL LOW (ref 3.87–5.11)

## 2013-03-22 LAB — PROTIME-INR: Prothrombin Time: 25.5 seconds — ABNORMAL HIGH (ref 11.6–15.2)

## 2013-03-22 MED ORDER — SODIUM CHLORIDE 0.9 % IV SOLN
INTRAVENOUS | Status: DC
  Administered 2013-03-22: 17:00:00 via INTRAVENOUS

## 2013-03-22 NOTE — ED Notes (Signed)
Pt reports (R) sided CP in the area where her breast was removed.  Denies N/V/SOB.  Pt reports tingling pain.

## 2013-03-22 NOTE — ED Provider Notes (Signed)
CSN: [id number]     Arrival date & time 03/22/13  1431 History     First MD Initiated Contact with Patient 03/22/13 1441     Chief Complaint  Patient presents with  . Chest Pain   (Consider location/radiation/quality/duration/timing/severity/associated sxs/prior Treatment) Patient is a 79 y.o. female presenting with chest pain. The history is provided by the patient and the spouse.  Chest Pain  patient here complaining of intermittent right-sided chest pain that last for approximately seconds to 1 minute. No anginal type symptoms. No associated diaphoresis or shortness of breath. No pleuritic component. Does have a history of breast cancer and she says the pain is where she had a right-sided mastectomy. Denies any rashes. Patient does have a history of PE in the past and takes Coumadin and this was diagnosed 5 months ago. Denies any fever or cough. No recent leg pain or swelling. Symptoms resolved spontaneously and no treatment used for this.  Past Medical History  Diagnosis Date  . Diabetes mellitus   . Hypertension   . Renal disorder   . Cancer 2000    sp bilateral masectomy, sp chemo   Past Surgical History  Procedure Laterality Date  . Breast surgery    . Mastecomy     Family History  Problem Relation Age of Onset  . Hypertension Mother   . Diabetes Mellitus II Father    History  Substance Use Topics  . Smoking status: Never Smoker   . Smokeless tobacco: Not on file  . Alcohol Use: No   OB History   Grav Para Term Preterm Abortions TAB SAB Ect Mult Living                 Review of Systems  Cardiovascular: Positive for chest pain.  All other systems reviewed and are negative.    Allergies  Cortisone; Oxycodone; and Penicillins  Home Medications   Current Outpatient Rx  Name  Route  Sig  Dispense  Refill  . acetaminophen (TYLENOL) 500 MG tablet   Oral   Take 1,000 mg by mouth every 6 (six) hours as needed for pain.         Marland Kitchen insulin lispro  protamine-insulin lispro (HUMALOG 75/25) (75-25) 100 UNIT/ML SUSP   Subcutaneous   Inject 8-10 Units into the skin 2 (two) times daily. Uses 10 units in the AM and 8 units at supper         . omeprazole (PRILOSEC) 20 MG capsule   Oral   Take 20 mg by mouth daily.         . sertraline (ZOLOFT) 50 MG tablet   Oral   Take 50 mg by mouth daily.         . simvastatin (ZOCOR) 40 MG tablet   Oral   Take 40 mg by mouth every evening.         . valsartan (DIOVAN) 160 MG tablet   Oral   Take 160 mg by mouth daily.         Marland Kitchen warfarin (COUMADIN) 1 MG tablet   Oral   Take 1 mg by mouth as directed.         . warfarin (COUMADIN) 3 MG tablet   Oral   Take 1 tablet (3 mg total) by mouth daily.   30 tablet   0    BP 159/45  Pulse 65  Temp(Src) 97.7 F (36.5 C) (Oral)  Resp 16  SpO2 99% Physical Exam  Nursing note and vitals  reviewed. Constitutional: She is oriented to person, place, and time. She appears well-developed and well-nourished.  Non-toxic appearance. No distress.  HENT:  Head: Normocephalic and atraumatic.  Eyes: Conjunctivae, EOM and lids are normal. Pupils are equal, round, and reactive to light.  Neck: Normal range of motion. Neck supple. No tracheal deviation present. No mass present.  Cardiovascular: Normal rate, regular rhythm and normal heart sounds.  Exam reveals no gallop.   No murmur heard. Pulmonary/Chest: Effort normal and breath sounds normal. No stridor. No respiratory distress. She has no decreased breath sounds. She has no wheezes. She has no rhonchi. She has no rales.  Abdominal: Soft. Normal appearance and bowel sounds are normal. She exhibits no distension. There is no tenderness. There is no rebound and no CVA tenderness.  Musculoskeletal: Normal range of motion. She exhibits no edema and no tenderness.  Neurological: She is alert and oriented to person, place, and time. She has normal strength. No cranial nerve deficit or sensory deficit.  GCS eye subscore is 4. GCS verbal subscore is 5. GCS motor subscore is 6.  Skin: Skin is warm and dry. No abrasion and no rash noted.  Psychiatric: She has a normal mood and affect. Her speech is normal and behavior is normal.    ED Course   Procedures (including critical care time)  Labs Reviewed  CBC WITH DIFFERENTIAL  COMPREHENSIVE METABOLIC PANEL  PROTIME-INR   No results found. No diagnosis found.  MDM   Date: 03/22/2013  Rate: 65  Rhythm: normal sinus rhythm  QRS Axis: normal  Intervals: normal  ST/T Wave abnormalities: nonspecific ST changes  Conduction Disutrbances:none  Narrative Interpretation:   Old EKG Reviewed: none available   6:07 PM Patient with right-sided atypical chest pain with a therapeutic INR. She has no signs of tachycardia or dyspnea at this time. Her pain lasts only for a couple seconds. Palpation of PE or ACS. She is stable for discharge  Toy Baker, MD 03/22/13 [id number]

## 2013-05-31 ENCOUNTER — Ambulatory Visit: Payer: Medicare Other | Admitting: Endocrinology

## 2013-05-31 DIAGNOSIS — Z0289 Encounter for other administrative examinations: Secondary | ICD-10-CM

## 2013-06-04 ENCOUNTER — Encounter: Payer: Self-pay | Admitting: *Deleted

## 2013-06-12 ENCOUNTER — Ambulatory Visit (INDEPENDENT_AMBULATORY_CARE_PROVIDER_SITE_OTHER): Payer: Medicare Other | Admitting: Endocrinology

## 2013-06-12 ENCOUNTER — Other Ambulatory Visit (INDEPENDENT_AMBULATORY_CARE_PROVIDER_SITE_OTHER): Payer: Medicare Other

## 2013-06-12 VITALS — BP 122/56 | HR 64 | Temp 98.6°F | Resp 10 | Ht 66.0 in | Wt 148.5 lb

## 2013-06-12 DIAGNOSIS — E7800 Pure hypercholesterolemia, unspecified: Secondary | ICD-10-CM

## 2013-06-12 DIAGNOSIS — E119 Type 2 diabetes mellitus without complications: Secondary | ICD-10-CM

## 2013-06-12 LAB — COMPREHENSIVE METABOLIC PANEL
BUN: 53 mg/dL — ABNORMAL HIGH (ref 6–23)
CO2: 31 mEq/L (ref 19–32)
Calcium: 9.4 mg/dL (ref 8.4–10.5)
Chloride: 99 mEq/L (ref 96–112)
Creatinine, Ser: 2.4 mg/dL — ABNORMAL HIGH (ref 0.4–1.2)
GFR: 25.35 mL/min — ABNORMAL LOW (ref >60.00)
Glucose, Bld: 212 mg/dL — ABNORMAL HIGH (ref 70–99)
Total Bilirubin: 0.5 mg/dL (ref 0.3–1.2)

## 2013-06-12 LAB — LIPID PANEL
HDL: 54.8 mg/dL (ref >39.00)
LDL Cholesterol: 102 mg/dL — ABNORMAL HIGH (ref 0–99)
Total CHOL/HDL Ratio: 3
Triglycerides: 161 mg/dL — ABNORMAL HIGH (ref 0.0–149.0)
VLDL: 32.2 mg/dL (ref 0.0–40.0)

## 2013-06-12 LAB — MICROALBUMIN / CREATININE URINE RATIO: Microalb, Ur: 0.7 mg/dL (ref 0.0–1.9)

## 2013-06-12 LAB — HEMOGLOBIN A1C: Hgb A1c MFr Bld: 8.2 % — ABNORMAL HIGH (ref 4.6–6.5)

## 2013-06-12 NOTE — Patient Instructions (Signed)
Please check blood sugars at least half the time about 2 hours after any meal and as directed on waking up. Please bring blood sugar monitor to each visit  Insulin 12 in am and 10 at supper

## 2013-06-12 NOTE — Progress Notes (Signed)
Patient ID: Kristina [name], female   DOB: 04/02/1931, 79 y.o.   MRN: [id number]  Reason for Appointment: Diabetes follow-up   History of Present Illness   Diagnosis: Type 2 diabetes mellitus, date of diagnosis: 15-20 years ago   She has been on small doses of premixed insulin twice a day for quite sometime. She again did not bring her monitor for download and blood sugar levels are difficult to analyze.  On her last visit her A1c was relatively high, it had been 6.4 last year without any hypoglycemia   She is again in the habit of checking her blood sugar before breakfast and supper.  She is trying to take her insulin before her meals twice a day However she did not increase her evening dose to 10 units as directed and fasting readings are still mostly high Also readings before supper are poorly higher too No labs are available recently  Overall control appears to be reasonably good for her age without any hypoglycemia. The insulin regimen is described as premixed insulin 10 units in the morning and 8 units at supper.   Complications: are: peripheral neuropathy, burning, tingling .  Monitors blood glucose: ? Twice a day  Glucometer: Unknown  Blood Glucose readings: Before breakfast: 139-170; acs 160-170, usually no readings after meals Hypoglycemia:  none Meals: 3 meals per day, sometimes will have only Ensure; has cereal for breakfast.  Physical activity: exercise: some walking, physical therapy.  Certified Diabetes Educator visit: 8/12.  Dietician visit: Most recent: 11/12.   The last HbgA1c was done in 9/14 and was 7.6   Lab Results  Component Value Date   HGBA1C 7.8* 03/01/2013   HGBA1C 7.2* 10/15/2012   Lab Results  Component Value Date   MICROALBUR 11.5* 03/01/2013   CREATININE 1.58* 03/22/2013     Hyperlipidemia:  Target LDL < 100.  Medications simvastatin, compliant with this.  Labs LDL - previously 118  Renal Insufficiency:   She has had chronic renal insufficiency  with the last creatinine 1.6  She has secondary hyperparathyroidism. PTH on the last visit was 81 with the target level less than 70.     Medication List       This list is accurate as of: 06/12/13 11:09 AM.  Always use your most recent med list.               Biotin 1000 MCG tablet  Take 1,000 mcg by mouth daily.     calcitRIOL 0.25 MCG capsule  Commonly known as:  ROCALTROL  Take 0.25 mcg by mouth daily.     gabapentin 100 MG capsule  Commonly known as:  NEURONTIN     hydrochlorothiazide 25 MG tablet  Commonly known as:  HYDRODIURIL  Take 25 mg by mouth daily.     insulin lispro protamine-lispro (75-25) 100 UNIT/ML Susp injection  Commonly known as:  HUMALOG 75/25  Inject 8-10 Units into the skin 2 (two) times daily. Uses 10 units in the AM and 8 units at supper     NAMENDA 10 MG tablet  Generic drug:  memantine     omeprazole 20 MG capsule  Commonly known as:  PRILOSEC  Take 20 mg by mouth daily.     sertraline 50 MG tablet  Commonly known as:  ZOLOFT  Take 50 mg by mouth daily.     simvastatin 40 MG tablet  Commonly known as:  ZOCOR  Take 40 mg by mouth every evening.     valsartan  160 MG tablet  Commonly known as:  DIOVAN  Take 160 mg by mouth daily.     warfarin 1 MG tablet  Commonly known as:  COUMADIN  Take 1 mg by mouth every evening. Takes along with 3mg  tabs = 4mg  daily     warfarin 3 MG tablet  Commonly known as:  COUMADIN  Take 3 mg by mouth every evening. Takes along with 1mg  tabs = 4mg  daily        Allergies:  Allergies  Allergen Reactions  . Cortisone Other (See Comments)    Injection-altered mental status, didn't know her name, where she lived, etc.     . Oxycodone Other (See Comments)    Caused patient to go crazy  . Penicillins Rash    Past Medical History  Diagnosis Date  . Diabetes mellitus   . Hypertension   . Renal disorder   . Cancer 2000    sp bilateral masectomy, sp chemo    Past Surgical History  Procedure  Laterality Date  . Breast surgery    . Mastecomy      Family History  Problem Relation Age of Onset  . Hypertension Mother   . Diabetes Mellitus II Father     Social History:  reports that she has never smoked. She does not have any smokeless tobacco history on file. She reports that she does not drink alcohol or use illicit drugs.  Review of Systems  Has history of hypertension Positive history of depression He is on Coumadin for history of DVT and PE   Examination:   BP 122/56  Pulse 64  Temp(Src) 98.6 F (37 C)  Resp 10  Ht 5\' 6"  (1.676 m)  Wt 148 lb 8 oz (67.359 kg)  BMI 23.98 kg/m2  SpO2 98%  Body mass index is 23.98 kg/(m^2).   Assesment:   Diabetes type 2, uncontrolled - 250.02  The patient's diabetes control is about the same as last time She did not increase her evening dose as directed Also appears to be having high readings before supper Her A1c about 2 months ago was still higher than her usual levels Again difficult to assess her readings that she does not bring her meter Again she is taking small doses of insulin with relatively good control Currently her appetite is variable and would not like to aggressively increase her insulin She was given a new One Touch ultra glucose monitor today  PLAN:   1. To check home blood sugars on waking up or 2 hours after any meal  2. increase walking as tolerated B. Add protein to breakfast 3. Medication changes:   increase evening dose to 10 units and morning dose to 12 units  Kristina [name] 06/12/2013, 11:09 AM

## 2013-07-22 ENCOUNTER — Ambulatory Visit: Payer: Medicare Other | Admitting: Endocrinology

## 2013-07-22 DIAGNOSIS — Z0289 Encounter for other administrative examinations: Secondary | ICD-10-CM

## 2013-07-29 ENCOUNTER — Ambulatory Visit: Payer: Medicare Other | Admitting: Endocrinology

## 2013-07-29 DIAGNOSIS — Z0289 Encounter for other administrative examinations: Secondary | ICD-10-CM

## 2013-12-18 ENCOUNTER — Telehealth: Payer: Self-pay | Admitting: Internal Medicine

## 2013-12-18 ENCOUNTER — Telehealth: Payer: Self-pay

## 2013-12-18 NOTE — Telephone Encounter (Signed)
C/D 12/18/13 for appt.12/26/13

## 2013-12-18 NOTE — Telephone Encounter (Signed)
S/W PT IN REF TO NP APPT. ON 12/26/13@10 :30 REFERRING DR SHAMLEFFER DX-ABNORMAL PROTIEN C,HYPERCOAG MAILED NP PACKET

## 2013-12-26 ENCOUNTER — Ambulatory Visit (HOSPITAL_COMMUNITY)
Admission: RE | Admit: 2013-12-26 | Discharge: 2013-12-26 | Disposition: A | Discharge status: Home / Self Care | Payer: Medicare Other | Source: Ambulatory Visit | Attending: Internal Medicine | Admitting: Internal Medicine

## 2013-12-26 ENCOUNTER — Telehealth: Payer: Self-pay | Admitting: Internal Medicine

## 2013-12-26 ENCOUNTER — Other Ambulatory Visit: Payer: Self-pay | Admitting: Internal Medicine

## 2013-12-26 ENCOUNTER — Other Ambulatory Visit (HOSPITAL_BASED_OUTPATIENT_CLINIC_OR_DEPARTMENT_OTHER): Payer: Medicare Other

## 2013-12-26 ENCOUNTER — Encounter: Payer: Self-pay | Admitting: Internal Medicine

## 2013-12-26 ENCOUNTER — Ambulatory Visit (HOSPITAL_BASED_OUTPATIENT_CLINIC_OR_DEPARTMENT_OTHER): Payer: Medicare Other | Admitting: Internal Medicine

## 2013-12-26 ENCOUNTER — Ambulatory Visit: Payer: Medicare Other

## 2013-12-26 VITALS — BP 144/55 | HR 66 | Temp 97.7°F | Resp 18 | Ht 66.0 in | Wt 148.5 lb

## 2013-12-26 DIAGNOSIS — I2699 Other pulmonary embolism without acute cor pulmonale: Secondary | ICD-10-CM

## 2013-12-26 DIAGNOSIS — I82409 Acute embolism and thrombosis of unspecified deep veins of unspecified lower extremity: Secondary | ICD-10-CM

## 2013-12-26 DIAGNOSIS — N039 Chronic nephritic syndrome with unspecified morphologic changes: Secondary | ICD-10-CM

## 2013-12-26 DIAGNOSIS — D631 Anemia in chronic kidney disease: Secondary | ICD-10-CM

## 2013-12-26 DIAGNOSIS — M7989 Other specified soft tissue disorders: Secondary | ICD-10-CM

## 2013-12-26 DIAGNOSIS — M79609 Pain in unspecified limb: Secondary | ICD-10-CM

## 2013-12-26 LAB — COMPREHENSIVE METABOLIC PANEL (CC13)
ALK PHOS: 54 U/L (ref 40–150)
ALT: 12 U/L (ref 0–55)
AST: 16 U/L (ref 5–34)
Albumin: 3.3 g/dL — ABNORMAL LOW (ref 3.5–5.0)
Anion Gap: 11 mEq/L (ref 3–11)
BILIRUBIN TOTAL: 0.34 mg/dL (ref 0.20–1.20)
BUN: 35.8 mg/dL — ABNORMAL HIGH (ref 7.0–26.0)
CO2: 27 mEq/L (ref 22–29)
Calcium: 10.1 mg/dL (ref 8.4–10.4)
Chloride: 103 mEq/L (ref 98–109)
Creatinine: 1.7 mg/dL — ABNORMAL HIGH (ref 0.6–1.1)
GLUCOSE: 270 mg/dL — AB (ref 70–140)
Potassium: 3.9 mEq/L (ref 3.5–5.1)
SODIUM: 141 meq/L (ref 136–145)
TOTAL PROTEIN: 7.2 g/dL (ref 6.4–8.3)

## 2013-12-26 LAB — CBC WITH DIFFERENTIAL/PLATELET
BASO%: 0.7 % (ref 0.0–2.0)
Basophils Absolute: 0 10*3/uL (ref 0.0–0.1)
EOS ABS: 0.1 10*3/uL (ref 0.0–0.5)
EOS%: 2.5 % (ref 0.0–7.0)
HCT: 32.1 % — ABNORMAL LOW (ref 34.8–46.6)
HGB: 10.4 g/dL — ABNORMAL LOW (ref 11.6–15.9)
LYMPH%: 27.7 % (ref 14.0–49.7)
MCH: 29 pg (ref 25.1–34.0)
MCHC: 32.4 g/dL (ref 31.5–36.0)
MCV: 89.5 fL (ref 79.5–101.0)
MONO#: 0.4 10*3/uL (ref 0.1–0.9)
MONO%: 8.8 % (ref 0.0–14.0)
NEUT#: 2.9 10*3/uL (ref 1.5–6.5)
NEUT%: 60.3 % (ref 38.4–76.8)
PLATELETS: 187 10*3/uL (ref 145–400)
RBC: 3.58 10*6/uL — AB (ref 3.70–5.45)
RDW: 13.7 % (ref 11.2–14.5)
WBC: 4.8 10*3/uL (ref 3.9–10.3)
lymph#: 1.3 10*3/uL (ref 0.9–3.3)

## 2013-12-26 NOTE — Progress Notes (Signed)
[place] Telephone:([phone]   [phone]  NEW PATIENT EVALUATION   Name: Kristina [name] Date: 12/26/2013 MRN: [id number] DOB: [birth year]  PCP: No PCP Per Patient   REFERRING PHYSICIAN: Nena Jordan, Ibt*  REASON FOR REFERRAL:  Rule-out hypercoaguable disorder    HISTORY OF PRESENT ILLNESS:Kristina [name] is a 80 y.o. female with a history of DM2, hypertension, chronic kidney disease and breast cancer s/p bilateral mastectomy (about 20 years ago) who is being referred to our office by Dr. Kelton Pillar for further evaluation of her abnormal protein C (it was elevated) concerning for hypercoagulable disorder given her history of PE/DVT (09/2012).  Today, she is accompanied by her husband Herbie Baltimore who provides most her medical history given her poor memory.    She reports passing out on 10/15/2013 with a prior one week history of intermittent dyspnea and being  admitted to [facility] for a nearly one week hospitalization. She denied chest pain.  She was found to have a right PE doing this admission. In fact, her CT angio of chest demonstrated a right-sided pulmonary emboli with moderate clot burden on the right.  She also had a venous duplex of her left lower extremity which showed a DVT in her left peroneal vein on 10/16/2013.  She noted a recent period of immobilization with a car trip  to [place] a week prior to hospital presentation (about 330 miles) and notes traveling extensively even priror to this to visit an ill relative. She reported some swelling in her left leg and some pain bilaterally.  The pain resolved but shortly after she started to have shortness of breath as noted above. Since her hospital discharge, she has been maintained on coumadin with INR monitoring by her PCP's office.  Her hypercoagulable workup negative with exception of mildly elevated protein C.    She was last seen by Dr. Kelton Pillar on 12/16/2013.   Of note,  patient does have dementia and she is on Namenda.  According to Dr. Quin Hoop note, patient had fallen twice in the past one month and hurt her upper lip.  She uses a walker.  Husband reports that fell 2-3 weeks ago coming up the steps.  Prior to that, she tripped over a suitcase.  Over one year ago, she fell and hit the television.   She has not been very compliant with taking her coumadin due to her memory lost.  She is being referred to our office to determine if she requires coumadin any longer.  She denies any hematochezia but reports that her stools have been dark over the past few weeks.  She is given her medications by her husband.  She is warfarin 4 mg daily except Wednesday.  She was nurse prior to retirement. Her last colonoscopy was over 10 years ago by Dr. Timmothy Euler.   PAST MEDICAL HISTORY:  has a past medical history of Diabetes mellitus; Hypertension; Renal disorder; and Cancer (2000).     PAST SURGICAL HISTORY: Past Surgical History  Procedure Laterality Date  . Breast surgery    . Mastecomy       CURRENT MEDICATIONS: has a current medication list which includes the following prescription(s): biotin, calcitriol, gabapentin, hydrochlorothiazide, insulin lispro protamine-lispro, namenda, omeprazole, sertraline, simvastatin, valsartan, warfarin, and warfarin.   ALLERGIES: Cortisone; Oxycodone; and Penicillins   SOCIAL HISTORY:  reports that she has never smoked. She does not have any smokeless tobacco history on file. She reports that she does not  drink alcohol or use illicit drugs.   FAMILY HISTORY: family history includes Diabetes Mellitus II in her father; Hypertension in her mother.   LABORATORY DATA:  Results for orders placed in visit on 12/26/13 (from the past 48 hour(s))  CBC WITH DIFFERENTIAL     Status: Abnormal   Collection Time    12/26/13 10:46 AM      Result Value Ref Range   WBC 4.8  3.9 - 10.3 10e3/uL   NEUT# 2.9  1.5 - 6.5 10e3/uL   HGB 10.4 Repeated  and Verified (*) 11.6 - 15.9 g/dL   HCT 32.1 (*) 34.8 - 46.6 %   Platelets 187  145 - 400 10e3/uL   MCV 89.5  79.5 - 101.0 fL   MCH 29.0  25.1 - 34.0 pg   MCHC 32.4  31.5 - 36.0 g/dL   RBC 3.58 (*) 3.70 - 5.45 10e6/uL   RDW 13.7  11.2 - 14.5 %   lymph# 1.3  0.9 - 3.3 10e3/uL   MONO# 0.4  0.1 - 0.9 10e3/uL   Eosinophils Absolute 0.1  0.0 - 0.5 10e3/uL   Basophils Absolute 0.0  0.0 - 0.1 10e3/uL   NEUT% 60.3  38.4 - 76.8 %   LYMPH% 27.7  14.0 - 49.7 %   MONO% 8.8  0.0 - 14.0 %   EOS% 2.5  0.0 - 7.0 %   BASO% 0.7  0.0 - 2.0 %  COMPREHENSIVE METABOLIC PANEL ([facility]79)     Status: Abnormal   Collection Time    12/26/13 10:46 AM      Result Value Ref Range   Sodium 141  136 - 145 mEq/L   Potassium 3.9  3.5 - 5.1 mEq/L   Chloride 103  98 - 109 mEq/L   CO2 27  22 - 29 mEq/L   Glucose 270 (*) 70 - 140 mg/dl   BUN 35.8 (*) 7.0 - 26.0 mg/dL   Creatinine 1.7 (*) 0.6 - 1.1 mg/dL   Total Bilirubin 0.34  0.20 - 1.20 mg/dL   Alkaline Phosphatase 54  40 - 150 U/L   AST 16  5 - 34 U/L   ALT 12  0 - 55 U/L   Total Protein 7.2  6.4 - 8.3 g/dL   Albumin 3.3 (*) 3.5 - 5.0 g/dL   Calcium 10.1  8.4 - 10.4 mg/dL   Anion Gap 11  3 - 11 mEq/L      Results for Kristina [name], Kristina [name] (MRN [id number]) as of 12/26/2013 16:04  Ref. Range 10/15/2012 22:24  Anticardiolipin IgA Latest Range: <22 APL U/mL 12 (L)  Anticardiolipin IgG Latest Range: <23 GPL U/mL 6 (L)  Anticardiolipin IgM Latest Range: <11 MPL U/mL 0 (L)  PTT Lupus Anticoagulant Latest Range: 28.0-43.0 secs 30.1  PTTLA Confirmation Latest Range: <8.0 secs NOT APPL  PTTLA 4:1 Mix Latest Range: 28.0-43.0 secs NOT APPL  DRVVT Latest Range: <42.9 secs 33.8  Drvvt confirmation Latest Range: <1.11 Ratio NOT APPL  dRVVT Incubated 1:1 Mix Latest Range: <42.9 secs NOT APPL  Lupus Anticoagulant Latest Range: NOT DETECTED  NOT DETECTED  Beta-2 Glyco I IgG Latest Range: <20 G Units 0  Beta-2-Glycoprotein I IgA Latest Range: <20 A Units 9  Beta-2-Glycoprotein I  IgM Latest Range: <20 M Units 0  AntiThromb III Func Latest Range: 75-120 % 111  Recommendations-F5LEID: No range found (NOTE)  Protein C Activity Latest Range: 75-133 % 149 (H)  Protein C, Total Latest Range: 72-160 % 116  Protein  S Activity Latest Range: 69-129 % 93  Protein S Ag, Total Latest Range: 60-150 % 88   RADIOGRAPHY: Venous Duplex 05/282015  Preliminary Negative for DVT      REVIEW OF SYSTEMS:  Constitutional: Denies fevers, chills or abnormal weight loss Eyes: Denies blurriness of vision Ears, nose, mouth, throat, and face: Denies mucositis or sore throat Respiratory: Denies cough, dyspnea or wheezes Cardiovascular: Denies palpitation, chest discomfort or lower extremity swelling Gastrointestinal:  Denies nausea, heartburn or change in bowel habits Skin: Denies abnormal skin rashes Lymphatics: Denies new lymphadenopathy or easy bruising Neurological:Denies numbness, tingling or new weaknesses; + Memory problems Behavioral/Psych: Mood is stable, no new changes  All other systems were reviewed with the patient and are negative.  PHYSICAL EXAM:  height is 5\' 6"  (1.676 m) and weight is 148 lb 8 oz (67.359 kg). Her oral temperature is 97.7 F (36.5 C). Her blood pressure is 144/55 and her pulse is 66. Her respiration is 18 and oxygen saturation is 99%.    GENERAL:alert, no distress and comfortable; Elderly female who ambulates slowly with a walker SKIN: skin color, texture, turgor are normal, no rashes or significant lesions EYES: normal, Conjunctiva are pink and non-injected, sclera clear OROPHARYNX:no exudate, no erythema and lips, buccal mucosa, and tongue normal  NECK: supple, thyroid normal size, non-tender, without nodularity LYMPH:  no palpable lymphadenopathy in the cervical, axillary or inguinal LUNGS: clear to auscultation and percussion with normal breathing effort HEART: regular rate & rhythm and no murmurs and no lower extremity edema ABDOMEN:abdomen soft,  non-tender and normal bowel sounds Musculoskeletal:no cyanosis of digits and no clubbing  NEURO: alert & oriented x 3 with fluent speech, no focal motor/sensory deficits   IMPRESSION: RENALDA KAMINER is a 80 y.o. female with a history of    PLAN:  1.  Elevated Protein C.  --Typically for hypercoagulable disorder, we expect protein C defiency not elevation.  We checked her levels today but since she is on coumadin the accuracy of the protein c/s tests will be affected (likely decreased).  Hypercoagulable panel is pending. Her DVT was likely provoked due to prolonged immobilization.  Treatment would generally be for six months.  She does not have active malignancy. She does not have a family history of clots. She has not prior clots.   2. L Peroneal vein DVT (10/16/2012), provoked. --As noted above hypercoagulable work up was negative.  Given history of falls and negative repeat venous duplex and s/p a/c for over one year, I recommend to discontinue anticoagulation.  The patient and her husband was agreeable.  We will advise to stop if repeat tests are negative (excluding the one's that expected to be decreased while on coumadin). Repeat lower extremity duplex is negative for DVT.   3. R PE (10/15/2012) --As noted in #2.   4.  Frequent falls. --Patient counseled on avoidance of falls and to report any injury to head while on anti-coagulation.  Her bleeding risk is hall.    5. Insomnia. --Likely related to her advancing dementia.  Counseled on continued good sleep hygiene.   6.  Dementia. --Continue namenda.      7. Anemia secondary to chronic kidney disease. --Patient hemoglobin ins 10.4 today.  It is stable.  Her creatinine clearance is less than 30 mL/min.  She might benefit from aranesp in the future.  We will check iron studies and erythropoietin levels next visit.   8. Follow-up. --Patient will follow up to discuss the results her labs  and at that time we will advise to stop  anti-coagulation.  She reports being up-to-date on cancer screening.  She is aware of the results of her venous duplex (negative for DVT).   All questions were answered. The patient knows to call the clinic with any problems, questions or concerns. We can certainly see the patient much sooner if necessary.  I spent 40 minutes counseling the patient face to face. The total time spent in the appointment was 60 minutes.    Concha Norway, MD 12/26/2013 11:41 AM

## 2013-12-26 NOTE — Progress Notes (Signed)
No financial issues with new patient check in. She has not seen the dr yet. She has appt card and has not been out of the country. She has Fall band..didn't want wheelchair.

## 2013-12-26 NOTE — Telephone Encounter (Signed)
gv adn printed appt sched and avs for pt for June °

## 2013-12-26 NOTE — Progress Notes (Signed)
VASCULAR LAB PRELIMINARY  PRELIMINARY  PRELIMINARY  PRELIMINARY  Left lower extremity venous Doppler completed.    Preliminary report:  There is no DVT or SVT noted in the left lower extremity.  Iantha Fallen, RVT 12/26/2013, 3:30 PM

## 2013-12-31 ENCOUNTER — Telehealth: Payer: Self-pay

## 2013-12-31 ENCOUNTER — Telehealth: Payer: Self-pay | Admitting: *Deleted

## 2013-12-31 LAB — HYPERCOAGULABLE PANEL, COMPREHENSIVE
ANTICARDIOLIPIN IGA: 10 U/mL (ref <22)
ANTICARDIOLIPIN IGG: 6 GPL U/mL (ref <23)
ANTICARDIOLIPIN IGM: 0 [MPL'U]/mL (ref <11)
ANTITHROMB III FUNC: 111 % (ref 76–126)
BETA-2-GLYCOPROTEIN I IGA: 9 A Units (ref <20)
BETA-2-GLYCOPROTEIN I IGM: 6 M Units (ref <20)
Beta-2 Glyco I IgG: 11 G Units (ref <20)
DRVVT: 41 secs (ref <42.9)
Lupus Anticoagulant: NOT DETECTED
PROTEIN C, TOTAL: 57 % — AB (ref 72–160)
PROTEIN S TOTAL: 61 % (ref 60–150)
PTT LA: 32.7 s (ref 28.0–43.0)
Protein C Activity: 52 % — ABNORMAL LOW (ref 75–133)
Protein S Activity: 36 % — ABNORMAL LOW (ref 69–129)

## 2013-12-31 NOTE — Telephone Encounter (Signed)
Dr Yvonna Alanis sheffler called in [facility] to help family with medications and organization. Landen from [employer] called for information. I clarified what I could on meds and gave her our next appt time with pt.

## 2013-12-31 NOTE — Telephone Encounter (Signed)
Home health nurse called wanting verification about patients insulin dose.

## 2014-01-13 ENCOUNTER — Other Ambulatory Visit (HOSPITAL_BASED_OUTPATIENT_CLINIC_OR_DEPARTMENT_OTHER): Payer: Medicare Other

## 2014-01-13 ENCOUNTER — Encounter: Payer: Self-pay | Admitting: Internal Medicine

## 2014-01-13 ENCOUNTER — Other Ambulatory Visit: Payer: Self-pay | Admitting: Medical Oncology

## 2014-01-13 ENCOUNTER — Ambulatory Visit (HOSPITAL_BASED_OUTPATIENT_CLINIC_OR_DEPARTMENT_OTHER): Payer: Medicare Other | Admitting: Internal Medicine

## 2014-01-13 ENCOUNTER — Telehealth: Payer: Self-pay | Admitting: Internal Medicine

## 2014-01-13 VITALS — BP 142/47 | HR 52 | Temp 98.1°F | Resp 18 | Ht 66.0 in | Wt 149.1 lb

## 2014-01-13 DIAGNOSIS — I82409 Acute embolism and thrombosis of unspecified deep veins of unspecified lower extremity: Secondary | ICD-10-CM

## 2014-01-13 DIAGNOSIS — N183 Chronic kidney disease, stage 3 unspecified: Secondary | ICD-10-CM

## 2014-01-13 DIAGNOSIS — D6859 Other primary thrombophilia: Secondary | ICD-10-CM

## 2014-01-13 DIAGNOSIS — D631 Anemia in chronic kidney disease: Secondary | ICD-10-CM

## 2014-01-13 DIAGNOSIS — N039 Chronic nephritic syndrome with unspecified morphologic changes: Secondary | ICD-10-CM

## 2014-01-13 DIAGNOSIS — N189 Chronic kidney disease, unspecified: Secondary | ICD-10-CM

## 2014-01-13 DIAGNOSIS — I2699 Other pulmonary embolism without acute cor pulmonale: Secondary | ICD-10-CM

## 2014-01-13 LAB — CBC WITH DIFFERENTIAL/PLATELET
BASO%: 0.8 % (ref 0.0–2.0)
BASOS ABS: 0 10*3/uL (ref 0.0–0.1)
EOS ABS: 0.1 10*3/uL (ref 0.0–0.5)
EOS%: 3.2 % (ref 0.0–7.0)
HCT: 31.1 % — ABNORMAL LOW (ref 34.8–46.6)
HEMOGLOBIN: 10.1 g/dL — AB (ref 11.6–15.9)
LYMPH#: 1.2 10*3/uL (ref 0.9–3.3)
LYMPH%: 30.4 % (ref 14.0–49.7)
MCH: 29.2 pg (ref 25.1–34.0)
MCHC: 32.6 g/dL (ref 31.5–36.0)
MCV: 89.6 fL (ref 79.5–101.0)
MONO#: 0.3 10*3/uL (ref 0.1–0.9)
MONO%: 8.2 % (ref 0.0–14.0)
NEUT%: 57.4 % (ref 38.4–76.8)
NEUTROS ABS: 2.4 10*3/uL (ref 1.5–6.5)
Platelets: 189 10*3/uL (ref 145–400)
RBC: 3.47 10*6/uL — ABNORMAL LOW (ref 3.70–5.45)
RDW: 14.3 % (ref 11.2–14.5)
WBC: 4.1 10*3/uL (ref 3.9–10.3)

## 2014-01-13 LAB — IRON AND TIBC CHCC
%SAT: 18 % — ABNORMAL LOW (ref 21–57)
IRON: 44 ug/dL (ref 41–142)
TIBC: 240 ug/dL (ref 236–444)
UIBC: 196 ug/dL (ref 120–384)

## 2014-01-13 LAB — FERRITIN CHCC: FERRITIN: 28 ng/mL (ref 9–269)

## 2014-01-13 NOTE — Progress Notes (Signed)
[facility] OFFICE PROGRESS NOTE  No PCP Per Patient No address on file  DIAGNOSIS: DVT, lower extremity - Plan: CBC with Differential, CBC with Differential, Basic metabolic panel (Bmet) - [facility]  PE (pulmonary embolism) - Plan: CBC with Differential, CBC with Differential, Basic metabolic panel (Bmet) - [facility]  Anemia in chronic kidney disease(285.21) - Plan: CBC with Differential, CBC with Differential, Basic metabolic panel (Bmet) - [facility]  Chief Complaint  Patient presents with  . DVT, lower extremity    CURRENT TREATMENT:  Observation.   INTERVAL HISTORY: Kristina [name] 80 y.o. female  female with a history of DM2, hypertension, chronic kidney disease and breast cancer s/p bilateral mastectomy (about 20 years ago) who was referred to our office by Dr. Kelton Pillar for further evaluation of her abnormal protein C (it was elevated) concerning for hypercoagulable disorder given her history of PE/DVT (09/2012) and seen on 12/26/2013. Today, she is accompanied by her husband Kristina [name] who provides most her medical history given her poor memory associated with her advanced dementia.   As previously reported, she reports passing out on 10/15/2013 with a prior one week history of intermittent dyspnea and being admitted to [facility] for a nearly one week hospitalization. She denied chest pain. She was found to have a right PE doing this admission. In fact, her CT angio of chest demonstrated a right-sided pulmonary emboli with moderate clot burden on the right. She also had a venous duplex of her left lower extremity which showed a DVT in her left peroneal vein on 10/16/2013. She noted a recent period of immobilization with a car trip to [place] a week prior to hospital presentation (about 330 miles) and notes traveling extensively even priror to this to visit an ill relative. She reported some swelling in her left leg and some pain bilaterally. The pain resolved but  shortly after she started to have shortness of breath as noted above. Since her hospital discharge, she has been maintained on coumadin with INR monitoring by her PCP's office. Her hypercoagulable workup negative with exception of mildly elevated protein C.   She was last seen by Dr. Kelton Pillar on 12/16/2013. Of note, patient does have dementia and she is on Namenda. According to Dr. Quin Hoop note, patient had fallen twice in the past one month and hurt her upper lip. She uses a walker. Husband reports that fell 2-3 weeks ago coming up the steps. Prior to that, she tripped over a suitcase. Over one year ago, she fell and hit the television. She has not been very compliant with taking her coumadin due to her memory lost. She is being referred to our office to determine if she requires coumadin any longer. She denies any hematochezia but reports that her stools have been dark over the past few weeks. She is given her medications by her husband. She is warfarin 4 mg daily except Wednesday. She was nurse prior to retirement. Her last colonoscopy was over 10 years ago by Dr. Timmothy Euler.  MEDICAL HISTORY: Past Medical History  Diagnosis Date  . Diabetes mellitus   . Hypertension   . Renal disorder   . Cancer 2000    sp bilateral masectomy, sp chemo    INTERIM HISTORY: has Dyspnea; Type II or unspecified type diabetes mellitus without mention of complication, uncontrolled; HTN (hypertension); PE (pulmonary embolism); CKD (chronic kidney disease) stage 3, GFR 30-59 ml/min; DVT, lower extremity; and Pure hypercholesterolemia on her problem list.    ALLERGIES:  is allergic to cortisone; oxycodone; and penicillins.  MEDICATIONS: has a current medication list which includes the following prescription(s): biotin, calcitriol, gabapentin, hydrochlorothiazide, insulin lispro protamine-lispro, namenda, sertraline, simvastatin, and valsartan.  SURGICAL HISTORY:  Past Surgical History  Procedure Laterality Date   . Breast surgery    . Mastecomy      REVIEW OF SYSTEMS:   Constitutional: Denies fevers, chills or abnormal weight loss Eyes: Denies blurriness of vision Ears, nose, mouth, throat, and face: Denies mucositis or sore throat Respiratory: Denies cough, dyspnea or wheezes Cardiovascular: Denies palpitation, chest discomfort or lower extremity swelling Gastrointestinal:  Denies nausea, heartburn or change in bowel habits Skin: Denies abnormal skin rashes Lymphatics: Denies new lymphadenopathy or easy bruising Neurological:Denies numbness, tingling or new weaknesses Behavioral/Psych: Mood is stable, no new changes  All other systems were reviewed with the patient and are negative.  PHYSICAL EXAMINATION: ECOG PERFORMANCE STATUS: 1 - Symptomatic but completely ambulatory  Blood pressure 142/47, pulse 52, temperature 98.1 F (36.7 C), temperature source Oral, resp. rate 18, height 5\' 6"  (1.676 m), weight 149 lb 1.6 oz (67.631 kg), SpO2 100.00%.  GENERAL:alert, no distress and comfortable; Elderly female who ambulates slowly with a walker SKIN: skin color, texture, turgor are normal, no rashes or significant lesions EYES: normal, Conjunctiva are pink and non-injected, sclera clear OROPHARYNX:no exudate, no erythema and lips, buccal mucosa, and tongue normal  NECK: supple, thyroid normal size, non-tender, without nodularity LYMPH:  no palpable lymphadenopathy in the cervical, axillary or supraclavicular LUNGS: clear to auscultation with normal breathing effort, no wheezes or rhonchi HEART: regular rate & rhythm and no murmurs and no lower extremity edema ABDOMEN:abdomen soft, non-tender and normal bowel sounds Musculoskeletal:no cyanosis of digits and no clubbing  NEURO: alert & oriented x 3 with fluent speech, no focal motor/sensory deficits  Labs:  Lab Results  Component Value Date   WBC 4.1 01/13/2014   HGB 10.1* 01/13/2014   HCT 31.1* 01/13/2014   MCV 89.6 01/13/2014   PLT 189  01/13/2014   NEUTROABS 2.4 01/13/2014      Chemistry      Component Value Date/Time   NA 141 12/26/2013 1046   NA 139 06/12/2013 1026   K 3.9 12/26/2013 1046   K 3.6 06/12/2013 1026   CL 99 06/12/2013 1026   CO2 27 12/26/2013 1046   CO2 31 06/12/2013 1026   BUN 35.8* 12/26/2013 1046   BUN 53* 06/12/2013 1026   CREATININE 1.7* 12/26/2013 1046   CREATININE 2.4* 06/12/2013 1026      Component Value Date/Time   CALCIUM 10.1 12/26/2013 1046   CALCIUM 9.4 06/12/2013 1026   ALKPHOS 54 12/26/2013 1046   ALKPHOS 42 06/12/2013 1026   AST 16 12/26/2013 1046   AST 19 06/12/2013 1026   ALT 12 12/26/2013 1046   ALT 12 06/12/2013 1026   BILITOT 0.34 12/26/2013 1046   BILITOT 0.5 06/12/2013 1026       Basic Metabolic Panel: No results found for this basename: NA, K, CL, CO2, GLUCOSE, BUN, CREATININE, CALCIUM, MG, PHOS,  in the last 168 hours GFR Estimated Creatinine Clearance: 24.7 ml/min (by C-G formula based on Cr of 1.7). Liver Function Tests: No results found for this basename: AST, ALT, ALKPHOS, BILITOT, PROT, ALBUMIN,  in the last 168 hours No results found for this basename: LIPASE, AMYLASE,  in the last 168 hours No results found for this basename: AMMONIA,  in the last 168 hours Coagulation profile No results found for this basename: INR,  PROTIME,  in the last 168 hours  CBC:  Recent Labs Lab 01/13/14 0915  WBC 4.1  NEUTROABS 2.4  HGB 10.1*  HCT 31.1*  MCV 89.6  PLT 189    Anemia work up  Recent Labs  01/13/14 0915  FERRITIN 28  TIBC 240  IRON 44    Studies:  No results found.   RADIOGRAPHIC STUDIES: No results found.  ASSESSMENT: MAYARA PAULSON 80 y.o. female with a history of DVT, lower extremity - Plan: CBC with Differential, CBC with Differential, Basic metabolic panel (Bmet) - [facility]  PE (pulmonary embolism) - Plan: CBC with Differential, CBC with Differential, Basic metabolic panel (Bmet) - [facility]  Anemia in chronic kidney disease(285.21) - Plan: CBC with  Differential, CBC with Differential, Basic metabolic panel (Bmet) - [facility]   PLAN:   1. R/o Hypercoagulable disorder.  --We checked her levels last visit but since she was on coumadin the accuracy of the protein c/s tests will be affected (likely decreased). Hypercoagulable panel was negative.    2. L Peroneal vein DVT (10/16/2012), provoked.  --As noted above hypercoagulable work up was negative. Given history of falls and negative repeat venous duplex and s/p a/c for over one year, I recommend to discontinue anticoagulation. The patient and her husband was agreeable. We will advise to stop if repeat tests are negative (excluding the one's that expected to be decreased while on coumadin). Repeat lower extremity duplex is negative for DVT.   3. R PE (10/15/2012)  --As noted in #2.   4. Frequent falls.  --Patient counseled on avoidance of falls and to report any injury to head while on anti-coagulation. Her bleeding risk is high on anticoagulation.    5. Insomnia.  --Likely related to her advancing dementia. Counseled on continued good sleep hygiene.   6. Dementia.  --Continue namenda.   7. Anemia secondary to chronic kidney disease.  --Patient hemoglobin ins 10.1 today. It is stable. Her creatinine clearance is less than 30 mL/min. She might benefit from aranesp in the future. We will follow her hemoglobin.    8. Follow-up.  --She will follow up in 4 weeks for a CBC to monitor her anemia and in 2 months with repeat labs including CBC and chemistries.   All questions were answered. The patient knows to call the clinic with any problems, questions or concerns. We can certainly see the patient much sooner if necessary.  I spent 15 minutes counseling the patient face to face. The total time spent in the appointment was 25 minutes.    Gordon Carlson, MD 01/13/2014 12:42 PM

## 2014-01-13 NOTE — Telephone Encounter (Signed)
gv and printed appt sched and avs for opt fro July and Aug

## 2014-01-15 LAB — ERYTHROPOIETIN: ERYTHROPOIETIN: 20.2 m[IU]/mL — AB (ref 2.6–18.5)

## 2014-01-24 ENCOUNTER — Ambulatory Visit (INDEPENDENT_AMBULATORY_CARE_PROVIDER_SITE_OTHER): Payer: Medicare Other | Admitting: Neurology

## 2014-01-24 ENCOUNTER — Encounter: Payer: Self-pay | Admitting: Neurology

## 2014-01-24 VITALS — BP 114/74 | HR 69 | Temp 98.0°F | Ht 66.25 in | Wt 147.8 lb

## 2014-01-24 DIAGNOSIS — R269 Unspecified abnormalities of gait and mobility: Secondary | ICD-10-CM

## 2014-01-24 DIAGNOSIS — G309 Alzheimer's disease, unspecified: Principal | ICD-10-CM

## 2014-01-24 DIAGNOSIS — Z9181 History of falling: Secondary | ICD-10-CM

## 2014-01-24 DIAGNOSIS — F0280 Dementia in other diseases classified elsewhere without behavioral disturbance: Secondary | ICD-10-CM

## 2014-01-24 DIAGNOSIS — R2681 Unsteadiness on feet: Secondary | ICD-10-CM

## 2014-01-24 DIAGNOSIS — R296 Repeated falls: Secondary | ICD-10-CM

## 2014-01-24 LAB — TSH: TSH: 2.223 u[IU]/mL (ref 0.350–4.500)

## 2014-01-24 LAB — VITAMIN B12: Vitamin B-12: 524 pg/mL (ref 211–911)

## 2014-01-24 NOTE — Progress Notes (Addendum)
NEUROLOGY CONSULTATION NOTE  Kristina [name] MRN: [id number] DOB: Jun 12, 1931  Referring provider: Bernie Covey, NP Primary care provider: Bernie Covey, NP  Reason for consult:  Dementia, ringing in ears.  HISTORY OF PRESENT ILLNESS: Kristina [name] is an 80 year old right-handed woman with history of hypertension, type II diabetes mellitus, chronic kidney disease with anemia, breast cancer status post bilateral mastectomy in 2007, hypercholesterolemia, depression, diverticulosis, GERD, PE and DVT (recently discontinued Coumadin) who presents for dementia and ringing in the ears.  Symptoms started about a month or two ago.  She describes it as an undescribed of all sensation in her head. It feels like a heaviness and sounds like a hissing sound in her head. She has a unusual sensation in the back of her head as well. She says that things are muffled. She has no associated headache.  She has history of dementia.  Symptoms first started about after her hospitalization in 2014 for DVT.  Initially, she exhibited short-term memory problems, such as remembering the day of the week, appointments, medications, and names of familiar people. She no longer uses the stove because she would forget she left it on. She did not have difficulty recognizing faces of familiar people..  Symptoms have gradually gotten worse.  Her husband says that he found her on the phone with someone at 3:00 in the morning, giving them are her social security number. Another time, she found her on the phone giving her credit card information to them.  He had to take her pain guard from her. Unfortunately, her credit card is lost somewhere in the house. She has a bank account at [place] [employer] [place]. She wanted to move her money from one branch to another because she thought that her usual branch was stealing her money. She hasn't had any other delusions. She has not had any hallucinations. She denies depression. She is able to perform her  activities of daily living, such as dressing and bathing herself. Her husband pays the bills and he cooks for her. She no longer drives. She was recently started on Namenda 10mg  twice daily.  She also takes sertraline 50mg  daily and gabapentin 100mg  at bedtime. Her appetite is good. Her sleep pattern is poor. She is not agitated or combative. There is been no change in her personality or behavior. She usually does not go to bed until 2 or 3 in the morning and she wakes up at approximately 10 AM. She can easily fall asleep a nap during the day. She does not want her during the night. There is no known family history of dementia.  She has long-standing history of falls. She is not sure why she loses her balance. She has to amply with a walker and a cane. There is no associated dizziness or lightheadedness with the falls. She says that she just simply loses her balance or trips. She does have some pain in the back of her right knee and feels that her right knee is weak. She denies any neck or back pain. She does have some numbness and tingling in the feet, particularly in the last 2 toes of her left foot. She does have bowel and bladder incontinence in the past.  01/03/14 LABS: WBC 5.5, HGB 11, HCT 34.2, PLT 205, Na 140, K 4.4, BUN 39, Cr 1.62, TP 7.1, ALP 43, AST 16, ALT 11, HGB A1c 8.3, LDL 117, INR 1.6  PAST MEDICAL HISTORY: Past Medical History  Diagnosis Date  . Diabetes mellitus   .  Hypertension   . Renal disorder   . Cancer 2000    sp bilateral masectomy, sp chemo  . DVT (deep venous thrombosis)   . PE (pulmonary embolism)     PAST SURGICAL HISTORY: Past Surgical History  Procedure Laterality Date  . Breast surgery    . Mastecomy      MEDICATIONS: Current Outpatient Prescriptions on File Prior to Visit  Medication Sig Dispense Refill  . Biotin 1000 MCG tablet Take 1,000 mcg by mouth daily.      . calcitRIOL (ROCALTROL) 0.25 MCG capsule Take 0.25 mcg by mouth daily.      Marland Kitchen gabapentin  (NEURONTIN) 100 MG capsule Take 100 mg by mouth at bedtime.       . hydrochlorothiazide (HYDRODIURIL) 25 MG tablet Take 25 mg by mouth daily.      . insulin lispro protamine-insulin lispro (HUMALOG 75/25) (75-25) 100 UNIT/ML SUSP Inject 8-10 Units into the skin 2 (two) times daily. Uses 10 units in the AM and 8 units at supper      . NAMENDA 10 MG tablet Take 10 mg by mouth daily.       . sertraline (ZOLOFT) 50 MG tablet Take 50 mg by mouth daily.      . simvastatin (ZOCOR) 40 MG tablet Take 40 mg by mouth every evening.      . valsartan (DIOVAN) 160 MG tablet Take 160 mg by mouth daily.       No current facility-administered medications on file prior to visit.    ALLERGIES: Allergies  Allergen Reactions  . Cortisone Other (See Comments)    Injection-altered mental status, didn't know her name, where she lived, etc.     . Oxycodone Other (See Comments)    Caused patient to go crazy  . Penicillins Rash    FAMILY HISTORY: Family History  Problem Relation Age of Onset  . Hypertension Mother   . Diabetes Mellitus II Father     SOCIAL HISTORY: History   Social History  . Marital Status: Married    Spouse Name: N/A    Number of Children: N/A  . Years of Education: N/A   Occupational History  . Not on file.   Social History Main Topics  . Smoking status: Never Smoker   . Smokeless tobacco: Not on file  . Alcohol Use: No  . Drug Use: No  . Sexual Activity: Not on file   Other Topics Concern  . Not on file   Social History Narrative  . No narrative on file    REVIEW OF SYSTEMS: Constitutional: No fevers, chills, or sweats, no generalized fatigue, change in appetite Eyes: No visual changes, double vision, eye pain Ear, nose and throat: No hearing loss, ear pain, nasal congestion, sore throat Cardiovascular: No chest pain, palpitations Respiratory:  No shortness of breath at rest or with exertion, wheezes GastrointestinaI: No nausea, vomiting, diarrhea, abdominal  pain, fecal incontinence Genitourinary:  No dysuria, urinary retention or frequency Musculoskeletal:  No neck pain, back pain Integumentary: No rash, pruritus, skin lesions Neurological: as above Psychiatric: No depression, insomnia, anxiety Endocrine: No palpitations, fatigue, diaphoresis, mood swings, change in appetite, change in weight, increased thirst Hematologic/Lymphatic:  No anemia, purpura, petechiae. Allergic/Immunologic: no itchy/runny eyes, nasal congestion, recent allergic reactions, rashes  PHYSICAL EXAM: Filed Vitals:   01/24/14 0939  BP: 114/74  Pulse: 69  Temp: 98 F (36.7 C)   General: No acute distress Head:  Normocephalic/atraumatic Neck: supple, no paraspinal tenderness, full range of motion  Back: No paraspinal tenderness Heart: regular rate and rhythm Lungs: Clear to auscultation bilaterally. Vascular: No carotid bruits. Neurological Exam: Mental status: alert and oriented to person, place, and time (except date), poor delayed recall (0 of 5 words), but remote memory intact, fund of knowledge intact, attention and concentration mildly impaired, unable to perform serial 7 subtraction except for 100-7, unable to perform Trail Making Test, copy a cube or draw a clock, speech fluent and not dysarthric, difficulty with naming.  Repeating intact.  Naming fluency impaired.  MoCA 12/30 Cranial nerves: CN I: not tested CN II: pupils equal, round and reactive to light, visual fields intact, fundi unremarkable, without vessel changes, exudates, hemorrhages or papilledema. CN III, IV, VI:  full range of motion, no nystagmus, no ptosis CN V: facial sensation intact CN VII: upper and lower face symmetric CN VIII: hearing intact CN IX, X: gag intact, uvula midline CN XI: sternocleidomastoid and trapezius muscles intact CN XII: tongue midline Bulk & Tone: normal, no fasciculations. Motor: 5 out of 5 throughout Sensation: Pinprick sensation intact. Mildly reduced  vibration sensation in the toes, probably normal for age. Deep Tendon Reflexes: 2+ throughout and brisk. Toes downgoing. Possible bilateral Hoffman sign. Finger to nose testing: No dysmetria Heel to shin: No dysmetria Gait: Cautious but normal stride. Difficulty with walking in tandem. Romberg negative.  IMPRESSION: 1.  Probable Alzheimer's dementia 2.  Gait instability.  Multifactorial.  May be related to diabetes or dementia.  Although she does not have any spasticity or Babinski sign, her reflexes are brisk for someone her age and with diabetes.  Also, I seemed to have appreciated bilateral Hoffman sign.  Therefore, cervical stenosis needs to be ruled out. 3.  Head discomfort.  Not sure what to make of this.  PLAN: 1.  Continue Namenda 10mg  twice daily. 2.  Will get MRI of the brain and MRI of the cervical spine to evaluate for intracranial abnormality and cervical stenosis. 3.  Recommend MVI. 4.  Recommend a daily routine schedule for bedtime and meals. 5.  Ambulatory assistance (walker) 6.  Follow up in 3 months.  Thank you for allowing me to take part in the care of this patient.  Metta Clines, DO  CC:  Bernie Covey, NP

## 2014-01-24 NOTE — Patient Instructions (Addendum)
1.  Continue Namenda. 2.  Try taking multivitamin 3.  Set routine schedule:  Routine bed time and time to wake up.  Routine time for breakfast, lunch and dinner 4.  Try to go out for walk together 5.  We will get MRI of brain and cervical spine. 02/02/14 7:15am [address] . Number to call for directions [phone] 6.  Follow up in 3 months. 7.  Check some blood work.

## 2014-01-27 ENCOUNTER — Telehealth: Payer: Self-pay | Admitting: *Deleted

## 2014-01-27 NOTE — Telephone Encounter (Signed)
Message copied by Claudie Revering on Mon Jan 27, 2014  8:26 AM ------      Message from: JAFFE, ADAM R      Created: Mon Jan 27, 2014  5:46 AM       Labs are okay      ----- Message -----         From: Lab in [facility] Interface         Sent: 01/24/2014   3:03 PM           To: Dudley Major, DO                   ------

## 2014-01-27 NOTE — Telephone Encounter (Signed)
Patient is aware of normal labs  

## 2014-02-02 ENCOUNTER — Other Ambulatory Visit: Payer: Medicare Other

## 2014-02-06 ENCOUNTER — Ambulatory Visit
Admission: RE | Admit: 2014-02-06 | Discharge: 2014-02-06 | Disposition: A | Discharge status: Home / Self Care | Payer: Medicare Other | Source: Ambulatory Visit | Attending: Neurology | Admitting: Neurology

## 2014-02-06 DIAGNOSIS — F0280 Dementia in other diseases classified elsewhere without behavioral disturbance: Secondary | ICD-10-CM

## 2014-02-06 DIAGNOSIS — R296 Repeated falls: Secondary | ICD-10-CM

## 2014-02-06 DIAGNOSIS — G309 Alzheimer's disease, unspecified: Principal | ICD-10-CM

## 2014-02-06 DIAGNOSIS — R2681 Unsteadiness on feet: Secondary | ICD-10-CM

## 2014-02-07 ENCOUNTER — Telehealth: Payer: Self-pay | Admitting: *Deleted

## 2014-02-07 NOTE — Telephone Encounter (Signed)
Patient is aware of normal MRI of brain and c spine

## 2014-02-07 NOTE — Telephone Encounter (Signed)
Message copied by Claudie Revering on Fri Feb 07, 2014 11:07 AM ------      Message from: JAFFE, ADAM R      Created: Fri Feb 07, 2014  6:46 AM       MRI of brain and cervical spine unremarkable.      ----- Message -----         From: Rad Results In Interface         Sent: 02/06/2014  12:28 PM           To: Dudley Major, DO                   ------

## 2014-02-10 ENCOUNTER — Other Ambulatory Visit (HOSPITAL_BASED_OUTPATIENT_CLINIC_OR_DEPARTMENT_OTHER): Payer: Medicare Other

## 2014-02-10 DIAGNOSIS — I2699 Other pulmonary embolism without acute cor pulmonale: Secondary | ICD-10-CM

## 2014-02-10 DIAGNOSIS — N189 Chronic kidney disease, unspecified: Secondary | ICD-10-CM

## 2014-02-10 DIAGNOSIS — D631 Anemia in chronic kidney disease: Secondary | ICD-10-CM

## 2014-02-10 DIAGNOSIS — I82409 Acute embolism and thrombosis of unspecified deep veins of unspecified lower extremity: Secondary | ICD-10-CM

## 2014-02-10 DIAGNOSIS — N039 Chronic nephritic syndrome with unspecified morphologic changes: Secondary | ICD-10-CM

## 2014-02-10 LAB — CBC WITH DIFFERENTIAL/PLATELET
BASO%: 0.5 % (ref 0.0–2.0)
Basophils Absolute: 0 10*3/uL (ref 0.0–0.1)
EOS ABS: 0.1 10*3/uL (ref 0.0–0.5)
EOS%: 1.8 % (ref 0.0–7.0)
HEMATOCRIT: 31.1 % — AB (ref 34.8–46.6)
HGB: 10.1 g/dL — ABNORMAL LOW (ref 11.6–15.9)
LYMPH%: 21.6 % (ref 14.0–49.7)
MCH: 29.3 pg (ref 25.1–34.0)
MCHC: 32.6 g/dL (ref 31.5–36.0)
MCV: 89.9 fL (ref 79.5–101.0)
MONO#: 0.4 10*3/uL (ref 0.1–0.9)
MONO%: 8.3 % (ref 0.0–14.0)
NEUT%: 67.8 % (ref 38.4–76.8)
NEUTROS ABS: 3.1 10*3/uL (ref 1.5–6.5)
PLATELETS: 163 10*3/uL (ref 145–400)
RBC: 3.46 10*6/uL — AB (ref 3.70–5.45)
RDW: 13.9 % (ref 11.2–14.5)
WBC: 4.6 10*3/uL (ref 3.9–10.3)
lymph#: 1 10*3/uL (ref 0.9–3.3)

## 2014-02-13 ENCOUNTER — Telehealth: Payer: Self-pay | Admitting: *Deleted

## 2014-02-14 NOTE — Telephone Encounter (Signed)
error 

## 2014-02-28 ENCOUNTER — Telehealth: Payer: Self-pay | Admitting: Internal Medicine

## 2014-02-28 NOTE — Telephone Encounter (Signed)
, °

## 2014-03-10 ENCOUNTER — Other Ambulatory Visit: Payer: Medicare Other

## 2014-03-10 ENCOUNTER — Ambulatory Visit: Payer: Medicare Other

## 2014-03-12 ENCOUNTER — Ambulatory Visit (HOSPITAL_BASED_OUTPATIENT_CLINIC_OR_DEPARTMENT_OTHER): Payer: Medicare Other | Admitting: Internal Medicine

## 2014-03-12 ENCOUNTER — Other Ambulatory Visit (HOSPITAL_BASED_OUTPATIENT_CLINIC_OR_DEPARTMENT_OTHER): Payer: Medicare Other

## 2014-03-12 VITALS — BP 152/52 | HR 73 | Temp 97.9°F | Resp 18 | Ht 66.0 in | Wt 149.1 lb

## 2014-03-12 DIAGNOSIS — N039 Chronic nephritic syndrome with unspecified morphologic changes: Secondary | ICD-10-CM

## 2014-03-12 DIAGNOSIS — I2699 Other pulmonary embolism without acute cor pulmonale: Secondary | ICD-10-CM

## 2014-03-12 DIAGNOSIS — D631 Anemia in chronic kidney disease: Secondary | ICD-10-CM

## 2014-03-12 DIAGNOSIS — G4700 Insomnia, unspecified: Secondary | ICD-10-CM

## 2014-03-12 DIAGNOSIS — I82409 Acute embolism and thrombosis of unspecified deep veins of unspecified lower extremity: Secondary | ICD-10-CM

## 2014-03-12 DIAGNOSIS — N189 Chronic kidney disease, unspecified: Secondary | ICD-10-CM

## 2014-03-12 LAB — CBC WITH DIFFERENTIAL/PLATELET
BASO%: 0.5 % (ref 0.0–2.0)
Basophils Absolute: 0 10*3/uL (ref 0.0–0.1)
EOS%: 1.7 % (ref 0.0–7.0)
Eosinophils Absolute: 0.1 10*3/uL (ref 0.0–0.5)
HEMATOCRIT: 31.6 % — AB (ref 34.8–46.6)
HGB: 10.1 g/dL — ABNORMAL LOW (ref 11.6–15.9)
LYMPH%: 19.3 % (ref 14.0–49.7)
MCH: 28.9 pg (ref 25.1–34.0)
MCHC: 32.1 g/dL (ref 31.5–36.0)
MCV: 90.2 fL (ref 79.5–101.0)
MONO#: 0.4 10*3/uL (ref 0.1–0.9)
MONO%: 7.4 % (ref 0.0–14.0)
NEUT#: 3.4 10*3/uL (ref 1.5–6.5)
NEUT%: 71.1 % (ref 38.4–76.8)
PLATELETS: 199 10*3/uL (ref 145–400)
RBC: 3.5 10*6/uL — AB (ref 3.70–5.45)
RDW: 14.9 % — ABNORMAL HIGH (ref 11.2–14.5)
WBC: 4.9 10*3/uL (ref 3.9–10.3)
lymph#: 0.9 10*3/uL (ref 0.9–3.3)

## 2014-03-12 LAB — BASIC METABOLIC PANEL (CC13)
Anion Gap: 11 mEq/L (ref 3–11)
BUN: 27.8 mg/dL — AB (ref 7.0–26.0)
CO2: 29 mEq/L (ref 22–29)
CREATININE: 1.8 mg/dL — AB (ref 0.6–1.1)
Calcium: 9.7 mg/dL (ref 8.4–10.4)
Chloride: 104 mEq/L (ref 98–109)
Glucose: 226 mg/dl — ABNORMAL HIGH (ref 70–140)
Potassium: 4.2 mEq/L (ref 3.5–5.1)
Sodium: 143 mEq/L (ref 136–145)

## 2014-03-12 NOTE — Progress Notes (Signed)
[facility] OFFICE PROGRESS NOTE  No PCP Per Patient No address on file  DIAGNOSIS: PE (pulmonary embolism) - Plan: CBC with Differential, Basic metabolic panel (Bmet) - [facility], Lactate dehydrogenase (LDH) - [facility]  Chief Complaint  Patient presents with  . Follow-up    CURRENT TREATMENT:  Observation.   INTERVAL HISTORY: Kristina [name] 80 y.o. female  female with a history of DM2, hypertension, chronic kidney disease and breast cancer s/p bilateral mastectomy (about 20 years ago) who was referred to our office by Dr. Kelton Pillar for further evaluation of her abnormal protein C (it was elevated) concerning for hypercoagulable disorder given her history of PE/DVT (09/2012) and seen on 01/13/2014. Today, she is accompanied by her husband Kristina [name] who provides most her medical history given her poor memory associated with her advanced dementia.   As previously reported, she reports passing out on 10/15/2013 with a prior one week history of intermittent dyspnea and being admitted to [facility] for a nearly one week hospitalization. She denied chest pain. She was found to have a right PE doing this admission. In fact, her CT angio of chest demonstrated a right-sided pulmonary emboli with moderate clot burden on the right. She also had a venous duplex of her left lower extremity which showed a DVT in her left peroneal vein on 10/16/2013. She noted a recent period of immobilization with a car trip to [place] a week prior to hospital presentation (about 330 miles) and notes traveling extensively even priror to this to visit an ill relative. She reported some swelling in her left leg and some pain bilaterally. The pain resolved but shortly after she started to have shortness of breath as noted above. Since her hospital discharge, she has been maintained on coumadin with INR monitoring by her PCP's office. Her hypercoagulable workup negative with exception of mildly elevated  protein C.   She was last seen by Dr. Kelton Pillar on 12/16/2013. Of note, patient does have dementia and she is on Namenda. According to Dr. Quin Hoop note, patient had fallen twice in the past one month and hurt her upper lip. She uses a walker. Husband reports that fell 2-3 weeks ago coming up the steps. Prior to that, she tripped over a suitcase. Over one year ago, she fell and hit the television. She has not been very compliant with taking her coumadin due to her memory lost. She was referred to our office to determine if she requires coumadin any longer and given above plus negative hypercoagulable work up, we suggested to stop coumadin.   MEDICAL HISTORY: Past Medical History  Diagnosis Date  . Diabetes mellitus   . Hypertension   . Renal disorder   . Cancer 2000    sp bilateral masectomy, sp chemo  . DVT (deep venous thrombosis)   . PE (pulmonary embolism)     INTERIM HISTORY: has Dyspnea; Type II or unspecified type diabetes mellitus without mention of complication, uncontrolled; HTN (hypertension); PE (pulmonary embolism); CKD (chronic kidney disease) stage 3, GFR 30-59 ml/min; DVT, lower extremity; and Pure hypercholesterolemia on her problem list.    ALLERGIES:  is allergic to cortisone; oxycodone; and penicillins.  MEDICATIONS: has a current medication list which includes the following prescription(s): biotin, calcitriol, gabapentin, hydrochlorothiazide, insulin lispro protamine-lispro, multivitamin, namenda, rosuvastatin, sertraline, valsartan, and vitamin b-12.  SURGICAL HISTORY:  Past Surgical History  Procedure Laterality Date  . Breast surgery    . Mastecomy      REVIEW OF  SYSTEMS:   Constitutional: Denies fevers, chills or abnormal weight loss Eyes: Denies blurriness of vision Ears, nose, mouth, throat, and face: Denies mucositis or sore throat Respiratory: Denies cough, dyspnea or wheezes Cardiovascular: Denies palpitation, chest discomfort or lower extremity  swelling Gastrointestinal:  Denies nausea, heartburn or change in bowel habits Skin: Denies abnormal skin rashes Lymphatics: Denies new lymphadenopathy or easy bruising Neurological:Denies numbness, tingling or new weaknesses Behavioral/Psych: Mood is stable, no new changes  All other systems were reviewed with the patient and are negative.  PHYSICAL EXAMINATION: ECOG PERFORMANCE STATUS: 1 - Symptomatic but completely ambulatory  Blood pressure 152/52, pulse 73, temperature 97.9 F (36.6 C), temperature source Oral, resp. rate 18, height 5\' 6"  (1.676 m), weight 149 lb 1.6 oz (67.631 kg), SpO2 99.00%.  GENERAL:alert, no distress and comfortable; Elderly female who ambulates slowly with a walker SKIN: skin color, texture, turgor are normal, no rashes or significant lesions EYES: normal, Conjunctiva are pink and non-injected, sclera clear OROPHARYNX:no exudate, no erythema and lips, buccal mucosa, and tongue normal  NECK: supple, thyroid normal size, non-tender, without nodularity LYMPH:  no palpable lymphadenopathy in the cervical, axillary or supraclavicular LUNGS: clear to auscultation with normal breathing effort, no wheezes or rhonchi HEART: regular rate & rhythm and no murmurs and no lower extremity edema ABDOMEN:abdomen soft, non-tender and normal bowel sounds Musculoskeletal:no cyanosis of digits and no clubbing  NEURO: alert & oriented x 3 with fluent speech, no focal motor/sensory deficits  Labs:  Lab Results  Component Value Date   WBC 4.9 03/12/2014   HGB 10.1* 03/12/2014   HCT 31.6* 03/12/2014   MCV 90.2 03/12/2014   PLT 199 03/12/2014   NEUTROABS 3.4 03/12/2014      Chemistry      Component Value Date/Time   NA 143 03/12/2014 1313   NA 139 06/12/2013 1026   K 4.2 03/12/2014 1313   K 3.6 06/12/2013 1026   CL 99 06/12/2013 1026   CO2 29 03/12/2014 1313   CO2 31 06/12/2013 1026   BUN 27.8* 03/12/2014 1313   BUN 53* 06/12/2013 1026   CREATININE 1.8* 03/12/2014 1313    CREATININE 2.4* 06/12/2013 1026      Component Value Date/Time   CALCIUM 9.7 03/12/2014 1313   CALCIUM 9.4 06/12/2013 1026   ALKPHOS 54 12/26/2013 1046   ALKPHOS 42 06/12/2013 1026   AST 16 12/26/2013 1046   AST 19 06/12/2013 1026   ALT 12 12/26/2013 1046   ALT 12 06/12/2013 1026   BILITOT 0.34 12/26/2013 1046   BILITOT 0.5 06/12/2013 1026       Basic Metabolic Panel:  Recent Labs Lab 03/12/14 1313  NA 143  K 4.2  CO2 29  GLUCOSE 226*  BUN 27.8*  CREATININE 1.8*  CALCIUM 9.7   GFR Estimated Creatinine Clearance: 23.3 ml/min (by C-G formula based on Cr of 1.8). Liver Function Tests: No results found for this basename: AST, ALT, ALKPHOS, BILITOT, PROT, ALBUMIN,  in the last 168 hours No results found for this basename: LIPASE, AMYLASE,  in the last 168 hours No results found for this basename: AMMONIA,  in the last 168 hours Coagulation profile No results found for this basename: INR, PROTIME,  in the last 168 hours  CBC:  Recent Labs Lab 03/12/14 1313  WBC 4.9  NEUTROABS 3.4  HGB 10.1*  HCT 31.6*  MCV 90.2  PLT 199    Anemia work up No results found for this basename: VITAMINB12, FOLATE, FERRITIN, TIBC, IRON, RETICCTPCT,  in the last 72 hours  Studies:  No results found.   RADIOGRAPHIC STUDIES: No results found.  ASSESSMENT: TALOR DESROSIERS 80 y.o. female with a history of PE (pulmonary embolism) - Plan: CBC with Differential, Basic metabolic panel (Bmet) - [facility], Lactate dehydrogenase (LDH) - [facility]   PLAN:   1. R/o Hypercoagulable disorder.  -- Hypercoagulable panel was negative.    2. L Peroneal vein DVT (10/16/2012), provoked.  --As noted above hypercoagulable work up was negative. Given history of falls and negative repeat venous duplex and s/p a/c for over one year, I recommend to discontinue anticoagulation. The patient and her husband was agreeable.  Repeat lower extremity duplex is negative for DVT.   3. R PE (10/15/2012)  --As noted in #2.    4. Frequent falls.  --Patient counseled on avoidance of falls and to report any injury to head while on anti-coagulation. Her bleeding risk is high on anticoagulation.    5. Insomnia.  --Likely related to her advancing dementia. Counseled on continued good sleep hygiene.   6. Dementia.  --Continue namenda.   7. Anemia secondary to chronic kidney disease.  --Patient hemoglobin ins 10.1 today. It is stable. Her creatinine clearance is less than 23.3 mL/min. She might benefit from aranesp in the future. We will follow her hemoglobin.    8. Follow-up.  --She will follow up in 3 months with repeat labs including CBC and chemistries.   All questions were answered. The patient knows to call the clinic with any problems, questions or concerns. We can certainly see the patient much sooner if necessary.  I spent 15 minutes counseling the patient face to face. The total time spent in the appointment was 25 minutes.    Tambria Pfannenstiel, MD 03/13/2014 6:07 AM

## 2014-03-14 ENCOUNTER — Telehealth: Payer: Self-pay | Admitting: Internal Medicine

## 2014-03-14 NOTE — Telephone Encounter (Signed)
s.w. pt and advised on NOv appt....pt ok and aware

## 2014-04-28 ENCOUNTER — Ambulatory Visit: Payer: Medicare Other | Admitting: Neurology

## 2014-04-29 ENCOUNTER — Telehealth: Payer: Self-pay | Admitting: Neurology

## 2014-04-29 ENCOUNTER — Encounter: Payer: Self-pay | Admitting: *Deleted

## 2014-04-29 NOTE — Progress Notes (Unsigned)
No show letter sent for 04/28/2014

## 2014-04-29 NOTE — Telephone Encounter (Signed)
Pt no showed 04/28/14 follow up appt w/ Dr. Tomi Likens.  Danae Chen - please send no show letter to patient / Sherri S.

## 2014-05-15 ENCOUNTER — Encounter: Payer: Self-pay | Admitting: Neurology

## 2014-05-15 ENCOUNTER — Ambulatory Visit (INDEPENDENT_AMBULATORY_CARE_PROVIDER_SITE_OTHER): Payer: Medicare Other | Admitting: Neurology

## 2014-05-15 VITALS — BP 118/64 | HR 68 | Resp 16 | Wt 148.8 lb

## 2014-05-15 DIAGNOSIS — R2681 Unsteadiness on feet: Secondary | ICD-10-CM

## 2014-05-15 DIAGNOSIS — G309 Alzheimer's disease, unspecified: Secondary | ICD-10-CM

## 2014-05-15 DIAGNOSIS — E0842 Diabetes mellitus due to underlying condition with diabetic polyneuropathy: Secondary | ICD-10-CM

## 2014-05-15 DIAGNOSIS — F0280 Dementia in other diseases classified elsewhere without behavioral disturbance: Secondary | ICD-10-CM

## 2014-05-15 NOTE — Progress Notes (Signed)
NEUROLOGY FOLLOW UP OFFICE NOTE  Kristina [name] [id number]  HISTORY OF PRESENT ILLNESS: Kristina [name] is an 80 year old right-handed woman with history of hypertension, type II diabetes mellitus with peripheral neuropathy, chronic kidney disease with anemia, breast cancer status post bilateral mastectomy in 2007, hypercholesterolemia, depression, diverticulosis, GERD, PE and DVT, and premature atrial contraction, who follows up for Alzheimer's disease, tinnitus and gait instability.  UPDATE: On 02/06/14, an MRI of the brain and cervical spine were unremarkable.    He currently takes Namenda 10mg  twice daily.  She takes gabapentin 300mg  at bedtime for lower extremity pain.  She takes sertraline 50mg  daily.  01/16/14 LABS:  HGB A1C 8.7, WBC 4.7, HGB 10.4, HCT 32.9, PLT 196, MCV 90.3  She tends to be stubborn and does not always use her walker.  She fell twice outside because she was not using her walker.  She tends to lay down most of the day, often napping.  She does not always eat her meals, however she has not lost weight.  She does not use her exercise bike.  She still notes the sensation of sound in her head.  HISTORY: Symptoms of "tinnitus" started about a 6 months ago.  She describes it as an undescribed of all sensation in her head. It feels like a heaviness and sounds like a hissing sound in her head. She has an unusual sensation in the back of her head as well. She says that things are muffled. She has no associated headache.  She has history of dementia.  Symptoms first started following her hospitalization in 2014 for DVT.  Initially, she exhibited short-term memory problems, such as remembering the day of the week, appointments, medications, and names of familiar people. She no longer uses the stove because she would forget she left it on. She did not have difficulty recognizing faces of familiar people.  Symptoms have gradually gotten worse.  Her husband says that he found her on the  phone with someone at 3:00 in the morning, giving them are her social security number. Another time, she found her on the phone giving her credit card information to them.  He had to take her pain guard from her. Unfortunately, her credit card is lost somewhere in the house. She has a bank account at [place] [employer] [place]. She wanted to move her money from one branch to another because she thought that her usual branch was stealing her money. She hasn't had any other delusions. She has not had any hallucinations. She denies depression. She is able to perform her activities of daily living, such as dressing and bathing herself. Her husband pays the bills and he cooks for her. She no longer drives. She was recently started on Namenda 10mg  twice daily.  She also takes sertraline 50mg  daily and gabapentin 100mg  at bedtime. Her appetite is good. Her sleep pattern is poor. She is not agitated or combative. There is been no change in her personality or behavior. She usually does not go to bed until 2 or 3 in the morning and she wakes up at approximately 10 AM. She can easily fall asleep a nap during the day. She does not want her during the night. There is no known family history of dementia.  MoCA score from 01/24/14 was 12/30.  She has long-standing history of falls. She is not sure why she loses her balance. She has to amply with a walker and a cane. There is no associated dizziness or lightheadedness  with the falls. She says that she just simply loses her balance or trips. She does have some pain in the back of her right knee and feels that her right knee is weak. She denies any neck or back pain. She does have some numbness and tingling in the feet, particularly in the last 2 toes of her left foot. She does have bowel and bladder incontinence in the past.  She has history of DVT and PE.  Hypercoagulable workup was negative.  Anticoagulation was discontinued due to fall risk.   She has leg pain, with numbness and tingling  in the feet, for which she takes gabapentin 300mg  at bedtime.  It has been noted that she has reduced pedal pulses.  PAST MEDICAL HISTORY: Past Medical History  Diagnosis Date  . Diabetes mellitus   . Hypertension   . Renal disorder   . Cancer 2000    sp bilateral masectomy, sp chemo  . DVT (deep venous thrombosis)   . PE (pulmonary embolism)     MEDICATIONS: Current Outpatient Prescriptions on File Prior to Visit  Medication Sig Dispense Refill  . Biotin 1000 MCG tablet Take 1,000 mcg by mouth daily.      . calcitRIOL (ROCALTROL) 0.25 MCG capsule Take 0.25 mcg by mouth daily.      Marland Kitchen gabapentin (NEURONTIN) 100 MG capsule Take 100 mg by mouth at bedtime.       . hydrochlorothiazide (HYDRODIURIL) 25 MG tablet Take 25 mg by mouth daily.      . insulin lispro protamine-insulin lispro (HUMALOG 75/25) (75-25) 100 UNIT/ML SUSP Inject 8-10 Units into the skin 2 (two) times daily. Uses 10 units in the AM and 8 units at supper      . Multiple Vitamin (MULTIVITAMIN) tablet Take 1 tablet by mouth daily.      Marland Kitchen NAMENDA 10 MG tablet Take 10 mg by mouth 2 (two) times daily.       . rosuvastatin (CRESTOR) 20 MG tablet Take 20 mg by mouth daily.      . sertraline (ZOLOFT) 50 MG tablet Take 50 mg by mouth daily.      . valsartan (DIOVAN) 160 MG tablet Take 160 mg by mouth daily.      . vitamin B-12 (CYANOCOBALAMIN) 1000 MCG tablet Take 1,000 mcg by mouth daily.       No current facility-administered medications on file prior to visit.    ALLERGIES: Allergies  Allergen Reactions  . Cortisone Other (See Comments)    Injection-altered mental status, didn't know her name, where she lived, etc.     . Oxycodone Other (See Comments)    Caused patient to go crazy  . Penicillins Rash    FAMILY HISTORY: Family History  Problem Relation Age of Onset  . Hypertension Mother   . Diabetes Mellitus II Father     SOCIAL HISTORY: History   Social History  . Marital Status: Married    Spouse Name:  N/A    Number of Children: N/A  . Years of Education: N/A   Occupational History  . Not on file.   Social History Main Topics  . Smoking status: Never Smoker   . Smokeless tobacco: Not on file  . Alcohol Use: No  . Drug Use: No  . Sexual Activity: Not on file   Other Topics Concern  . Not on file   Social History Narrative  . No narrative on file    REVIEW OF SYSTEMS: Constitutional: Fatigue.  No fevers,  chills, or sweats Eyes: No visual changes, double vision, eye pain Ear, nose and throat: No hearing loss, ear pain, nasal congestion, sore throat Cardiovascular: No chest pain, palpitations Respiratory:  No shortness of breath at rest or with exertion, wheezes GastrointestinaI: No nausea, vomiting, diarrhea, abdominal pain, fecal incontinence Genitourinary:  No dysuria, urinary retention or frequency Musculoskeletal:  No neck pain, back pain Integumentary: No rash, pruritus, skin lesions Neurological: as above Psychiatric: No depression, insomnia, anxiety Endocrine: decreased appetite.  Fatigue Hematologic/Lymphatic:  No anemia, purpura, petechiae. Allergic/Immunologic: no itchy/runny eyes, nasal congestion, recent allergic reactions, rashes  PHYSICAL EXAM: Filed Vitals:   05/15/14 1057  BP: 118/64  Pulse: 68  Resp: 16   General: No acute distress Head:  Normocephalic/atraumatic Neck: supple, no paraspinal tenderness, full range of motion Heart:  Regular rate and rhythm Lungs:  Clear to auscultation bilaterally Back: No paraspinal tenderness Neurological Exam: alert and oriented to person, place, and time (except date and day). Attention span and concentration poor, recent memory poor, remote memory intact, fund of knowledge intact.  Speech fluent and not dysarthric, language intact.   Montreal Cognitive Assessment  05/15/2014  Visuospatial/ Executive (0/5) 2  Naming (0/3) 2  Attention: Read list of digits (0/2) 1  Attention: Read list of letters (0/1) 0    Attention: Serial 7 subtraction starting at 100 (0/3) 0  Language: Repeat phrase (0/2) 1  Language : Fluency (0/1) 0  Abstraction (0/2) 1  Delayed Recall (0/5) 0  Orientation (0/6) 3  Total 10  Adjusted Score (based on education) 11    CN II-XII intact. Fundi not visualized.  Bulk and tone normal, muscle strength 5/5 throughout.  Sensation to light touch intact.  Deep tendon reflexes 3+ throughout, toes downgoing.  Finger to nose testing intact.  Gait cautious but not ataxic. Romberg negative.  IMPRESSION: 1.  Probable Alzheimer's dementia 2.  Gait instability.  Multifactorial.  May be related to diabetes, deconditioning and dementia.  She does have symmetrically brisk reflexes, but no evidence of myelopathy.   PLAN: 1.  Continue Namenda 10mg  twice daily 2.  PT.  Interested in going to [place] 3.  Use walker at all times. 4.  Follow up in 6 months.  Metta Clines, DO  CC:  Burnett Harry, MD

## 2014-05-15 NOTE — Patient Instructions (Signed)
1.  Continue Namenda 10mg  twice daily 2.  We will try to set you up at [facility] 3.  Use walker at all times 4.  Follow up in 6 months.

## 2014-06-03 ENCOUNTER — Other Ambulatory Visit: Payer: Self-pay | Admitting: *Deleted

## 2014-06-03 DIAGNOSIS — I2699 Other pulmonary embolism without acute cor pulmonale: Secondary | ICD-10-CM

## 2014-06-03 DIAGNOSIS — N183 Chronic kidney disease, stage 3 unspecified: Secondary | ICD-10-CM

## 2014-06-03 DIAGNOSIS — E7800 Pure hypercholesterolemia, unspecified: Secondary | ICD-10-CM

## 2014-06-04 ENCOUNTER — Other Ambulatory Visit (HOSPITAL_BASED_OUTPATIENT_CLINIC_OR_DEPARTMENT_OTHER): Payer: Medicare Other

## 2014-06-04 ENCOUNTER — Ambulatory Visit (HOSPITAL_BASED_OUTPATIENT_CLINIC_OR_DEPARTMENT_OTHER): Payer: Medicare Other | Admitting: Hematology

## 2014-06-04 ENCOUNTER — Encounter: Payer: Self-pay | Admitting: Hematology

## 2014-06-04 VITALS — BP 122/49 | HR 71 | Temp 98.2°F | Resp 18 | Ht 66.0 in | Wt 149.5 lb

## 2014-06-04 DIAGNOSIS — E7800 Pure hypercholesterolemia, unspecified: Secondary | ICD-10-CM

## 2014-06-04 DIAGNOSIS — Z86718 Personal history of other venous thrombosis and embolism: Secondary | ICD-10-CM

## 2014-06-04 DIAGNOSIS — Z86711 Personal history of pulmonary embolism: Secondary | ICD-10-CM

## 2014-06-04 DIAGNOSIS — N189 Chronic kidney disease, unspecified: Secondary | ICD-10-CM

## 2014-06-04 DIAGNOSIS — I2699 Other pulmonary embolism without acute cor pulmonale: Secondary | ICD-10-CM

## 2014-06-04 DIAGNOSIS — D631 Anemia in chronic kidney disease: Secondary | ICD-10-CM

## 2014-06-04 DIAGNOSIS — N183 Chronic kidney disease, stage 3 unspecified: Secondary | ICD-10-CM

## 2014-06-04 LAB — CBC WITH DIFFERENTIAL/PLATELET
BASO%: 0.6 % (ref 0.0–2.0)
BASOS ABS: 0 10*3/uL (ref 0.0–0.1)
EOS%: 1.6 % (ref 0.0–7.0)
Eosinophils Absolute: 0.1 10*3/uL (ref 0.0–0.5)
HCT: 31.5 % — ABNORMAL LOW (ref 34.8–46.6)
HGB: 10 g/dL — ABNORMAL LOW (ref 11.6–15.9)
LYMPH%: 24.7 % (ref 14.0–49.7)
MCH: 28.7 pg (ref 25.1–34.0)
MCHC: 31.8 g/dL (ref 31.5–36.0)
MCV: 90.1 fL (ref 79.5–101.0)
MONO#: 0.4 10*3/uL (ref 0.1–0.9)
MONO%: 8.9 % (ref 0.0–14.0)
NEUT%: 64.2 % (ref 38.4–76.8)
NEUTROS ABS: 3 10*3/uL (ref 1.5–6.5)
Platelets: 174 10*3/uL (ref 145–400)
RBC: 3.5 10*6/uL — AB (ref 3.70–5.45)
RDW: 14.5 % (ref 11.2–14.5)
WBC: 4.6 10*3/uL (ref 3.9–10.3)
lymph#: 1.1 10*3/uL (ref 0.9–3.3)

## 2014-06-04 LAB — BASIC METABOLIC PANEL (CC13)
ANION GAP: 12 meq/L — AB (ref 3–11)
BUN: 48.2 mg/dL — ABNORMAL HIGH (ref 7.0–26.0)
CALCIUM: 10.4 mg/dL (ref 8.4–10.4)
CO2: 31 mEq/L — ABNORMAL HIGH (ref 22–29)
Chloride: 100 mEq/L (ref 98–109)
Creatinine: 2 mg/dL — ABNORMAL HIGH (ref 0.6–1.1)
Glucose: 132 mg/dl (ref 70–140)
POTASSIUM: 3.9 meq/L (ref 3.5–5.1)
SODIUM: 143 meq/L (ref 136–145)

## 2014-06-04 LAB — LACTATE DEHYDROGENASE (CC13): LDH: 167 U/L (ref 125–245)

## 2014-06-04 NOTE — Progress Notes (Signed)
Kristina [name], Kristina [name], Kristina [name], Kristina [name] PLAMONDON 80 y.o. female  female with a history of DM2, hypertension, chronic kidney disease and breast cancer s/p bilateral mastectomy (about 20 years ago) who was referred to our office by Dr. Kelton Pillar for further evaluation of her abnormal protein C (it was elevated) concerning for hypercoagulable disorder given her history of PE/DVT (09/2012) and seen on 01/13/2014 BY Dr Juliann Mule. Today, she is accompanied by her husband Herbie [place] who provides most her medical history given her poor memory associated with her advanced dementia.   As previously reported, she reports passing out on 10/15/2012 with a prior one week history of intermittent dyspnea and being admitted to [facility] for a nearly one week hospitalization. She denied chest pain. She was found to have a right PE doing this admission. In fact, her CT angio of chest demonstrated a right-sided pulmonary emboli with moderate clot burden on the right. She also had a venous duplex of her left lower extremity which showed a DVT in her left peroneal vein on 10/16/2012. She noted a recent period of immobilization with a car trip to [place] a week prior to hospital presentation (about 330 miles) and notes traveling extensively even priror to this to visit an ill relative. She reported some swelling in her left leg and some pain bilaterally. The pain resolved but shortly after she started to have shortness of breath as noted above. Since her hospital discharge, she has been maintained on coumadin with INR monitoring by her PCP's office. Her  hypercoagulable workup negative with exception of mildly elevated protein C.   She was last seen by Dr. Kelton Pillar on 12/16/2013. Of note, patient does have dementia and she is on Namenda. According to Dr. Quin Hoop note, patient had fallen frequently last fall was about a month ago. She uses a walker. Over one year ago, she fell and hit the television. She has not been very compliant with taking her coumadin due to her memory lost. She was referred to our office to determine if she requires coumadin any longer and given above plus negative hypercoagulable work up, we suggested to stop coumadin. She has got her flu and pneumonia shot for this year.she does say her ear buzz some times and i did an ear exam which showed some wax bilaterally but no tympanic membrane pathology.she does like to drink regular pepsi and does not consume enough water.  MEDICAL HISTORY: Past Medical History  Diagnosis Date  . Diabetes mellitus   . Hypertension   . Renal disorder   . Cancer 2000    sp bilateral masectomy, sp chemo  . DVT (deep venous thrombosis)   . PE (pulmonary embolism)     INTERIM HISTORY: has Dyspnea; Type II or unspecified type diabetes mellitus without mention of complication, uncontrolled; HTN (hypertension); PE (pulmonary embolism); CKD (chronic kidney disease) stage 3, GFR 30-59 ml/min; DVT, lower extremity; and Pure hypercholesterolemia on her problem list.    ALLERGIES:  is allergic to cortisone; oxycodone; and penicillins.  MEDICATIONS: has a current medication list which includes the following prescription(s): biotin, calcitriol, gabapentin, hydrochlorothiazide, insulin lispro protamine-lispro, multivitamin, namenda,  rosuvastatin, sertraline, valsartan, and vitamin b-12.  SURGICAL HISTORY:  Past Surgical History  Procedure Laterality Date  . Breast surgery    . Mastecomy      REVIEW OF SYSTEMS:   Constitutional: Denies fevers, chills or abnormal weight loss Eyes: Denies  blurriness of vision Ears, nose, mouth, throat, and face: Denies mucositis or sore throat Respiratory: Denies cough, dyspnea or wheezes Cardiovascular: Denies palpitation, chest discomfort or lower extremity swelling Gastrointestinal:  Denies nausea, heartburn or change in bowel habits Skin: Denies abnormal skin rashes Lymphatics: Denies new lymphadenopathy or easy bruising Neurological:Denies numbness, tingling or new weaknesses Behavioral/Psych: Mood is stable, no new changes  All other systems were reviewed with the patient and are negative.  PHYSICAL EXAMINATION: ECOG PERFORMANCE STATUS: 1-2  Blood pressure 122/49, pulse 71, temperature 98.2 F (36.8 C), temperature source Oral, resp. rate 18, height 5\' 6"  (1.676 m), weight 149 lb 8 oz (67.813 kg).  GENERAL:alert, no distress and comfortable; Elderly female who ambulates slowly with a walker SKIN: skin color, texture, turgor are normal, no rashes or significant lesions EYES: normal, Conjunctiva are pink and non-injected, sclera clear OROPHARYNX:no exudate, no erythema and lips, buccal mucosa, and tongue normal  NECK: supple, thyroid normal size, non-tender, without nodularity LYMPH:  no palpable lymphadenopathy in the cervical, axillary or supraclavicular LUNGS: clear to auscultation with normal breathing effort, no wheezes or rhonchi HEART: regular rate & rhythm and no murmurs and no lower extremity edema ABDOMEN:abdomen soft, non-tender and normal bowel sounds Musculoskeletal:no cyanosis of digits and no clubbing  NEURO: alert & oriented x 3 with fluent speech, no focal motor/sensory deficits  Labs:  Lab Results  Component Value Date   WBC 4.6 06/04/2014   HGB 10.0* 06/04/2014   HCT 31.5* 06/04/2014   MCV 90.1 06/04/2014   PLT 174 06/04/2014   NEUTROABS 3.0 06/04/2014      Chemistry      Component Value Date/Time   NA 143 06/04/2014 1018   NA 139 06/12/2013 1026   K 3.9 06/04/2014 1018   K 3.6 06/12/2013 1026   CL  99 06/12/2013 1026   CO2 31* 06/04/2014 1018   CO2 31 06/12/2013 1026   BUN 48.2* 06/04/2014 1018   BUN 53* 06/12/2013 1026   CREATININE 2.0* 06/04/2014 1018   CREATININE 2.4* 06/12/2013 1026      Component Value Date/Time   CALCIUM 10.4 06/04/2014 1018   CALCIUM 9.4 06/12/2013 1026   ALKPHOS 54 12/26/2013 1046   ALKPHOS 42 06/12/2013 1026   AST 16 12/26/2013 1046   AST 19 06/12/2013 1026   ALT 12 12/26/2013 1046   ALT 12 06/12/2013 1026   BILITOT 0.34 12/26/2013 1046   BILITOT 0.5 06/12/2013 1026       Basic Metabolic Panel:  Recent Labs Lab 06/04/14 1018  NA 143  K 3.9  CO2 31*  GLUCOSE 132  BUN 48.2*  CREATININE 2.0*  CALCIUM 10.4   GFR Estimated Creatinine Clearance: 21 mL/min (by C-G formula based on Cr of 2). Liver Function Tests: No results for input(s): AST, ALT, ALKPHOS, BILITOT, PROT, ALBUMIN in the last 168 hours. No results for input(s): LIPASE, AMYLASE in the last 168 hours. No results for input(s): AMMONIA in the last 168 hours. Coagulation profile No results for input(s): INR, PROTIME in the last 168 hours.  CBC:  Recent Labs Lab 06/04/14 1018  WBC 4.6  NEUTROABS 3.0  HGB 10.0*  HCT 31.5*  MCV 90.1  PLT 174    Anemia  work up No results for input(s): VITAMINB12, FOLATE, Kristina [name], TIBC, Kristina, RETICCTPCT in the last 72 hours.  Studies:  No results found.   RADIOGRAPHIC STUDIES: No results found.  ASSESSMENT: TASHANTI DALPORTO 80 y.o. female with a history of Anemia of renal disease - Plan: CBC with [name], Kristina [name], Kristina [name], Kristina and TIBC [name]   PLAN:   1. R/o Hypercoagulable disorder.  -- Hypercoagulable panel was negative.    2. L Peroneal vein DVT (10/16/2012), provoked.  --As noted above hypercoagulable work up was negative. Given history of falls and negative repeat venous duplex and s/p a/c for over one year, I recommend to discontinue anticoagulation. The patient and her husband was  agreeable.  Repeat lower extremity duplex is negative for DVT.   3. R PE (10/15/2012)  --As noted in #2.   4. Frequent falls.  --Patient counseled on avoidance of falls and to report any injury to head while on anti-coagulation. Her bleeding risk is high on anticoagulation.    5. Insomnia.  --Likely related to her advancing dementia. Counseled on continued good sleep hygiene.   6. Dementia.  --Continue namenda.   7. Anemia secondary to chronic kidney disease.  --Patient hemoglobin ins 10.0 today. It is stable. Her creatinine clearance is 21 mL/min. She might benefit from aranesp in the future. We will follow her hemoglobin.    8. Follow-up.  --She will follow up in 6 months with repeat labs including CBC and chemistries.   All questions were answered. The patient knows to call the clinic with any problems, questions or concerns. We can certainly see the patient much sooner if necessary.  I spent 15 minutes counseling the patient face to face. The total time spent in the appointment was 25 minutes.    Bernadene Bell, MD Medical Hematologist/Oncologist [place] Pager: [phone] Office No: [phone]

## 2014-06-05 ENCOUNTER — Telehealth: Payer: Self-pay | Admitting: Hematology

## 2014-06-05 NOTE — Telephone Encounter (Signed)
s.w. pt and advised on May 2016 appts....pt ok and aware

## 2014-06-11 ENCOUNTER — Telehealth: Payer: Self-pay | Admitting: Neurology

## 2014-06-11 NOTE — Telephone Encounter (Signed)
Herbie Baltimore, pt's spouse called requesting to speak to a nurse regarding pt's condition. Please call him back # [phone]

## 2014-06-11 NOTE — Telephone Encounter (Signed)
Patients spouse will pick up copy of office notes for with DX  He is filling out forms for a new insurance plan .Office notes were left up at the front desk

## 2014-10-31 ENCOUNTER — Telehealth: Payer: Self-pay | Admitting: Oncology

## 2014-10-31 NOTE — Telephone Encounter (Signed)
No voicemail,moved patients appointment to dr Alen Blew and mailed a letter and a calendar to thepatient

## 2014-11-14 ENCOUNTER — Ambulatory Visit (INDEPENDENT_AMBULATORY_CARE_PROVIDER_SITE_OTHER): Payer: Medicare Other | Admitting: Neurology

## 2014-11-14 ENCOUNTER — Encounter: Payer: Self-pay | Admitting: Neurology

## 2014-11-14 VITALS — BP 120/62 | HR 68 | Ht 66.0 in | Wt 138.0 lb

## 2014-11-14 DIAGNOSIS — F0280 Dementia in other diseases classified elsewhere without behavioral disturbance: Secondary | ICD-10-CM | POA: Insufficient documentation

## 2014-11-14 DIAGNOSIS — F329 Major depressive disorder, single episode, unspecified: Secondary | ICD-10-CM | POA: Diagnosis not present

## 2014-11-14 DIAGNOSIS — G309 Alzheimer's disease, unspecified: Secondary | ICD-10-CM | POA: Diagnosis not present

## 2014-11-14 DIAGNOSIS — F32A Depression, unspecified: Secondary | ICD-10-CM | POA: Insufficient documentation

## 2014-11-14 NOTE — Progress Notes (Signed)
NEUROLOGY FOLLOW UP OFFICE NOTE  [name] [id number]  HISTORY OF PRESENT ILLNESS: Kristina [name] is an 81 year old right-handed woman with history of hypertension, type II diabetes mellitus with peripheral neuropathy, chronic kidney disease with anemia, breast cancer status post bilateral mastectomy in 2007, hypercholesterolemia, depression, diverticulosis, GERD, PE and DVT, and premature atrial contraction, who follows up for Alzheimer's disease, tinnitus and gait instability.  She is accompanied by her husband who provides most of history.  UPDATE: Her memory has gotten worse.  On a couple of occasions, she thought that her husband was her brother and was not aware that she had a husband.  She once opened the front door in the middle of the night.  The alarm alerted her husband.  He is afraid to leave her alone for a minute as she sometimes has falls.  She still note tinnitus which bothers her.  She is depressed and feels sorry for herself.  Her husband administers her medications.  She sleeps from 2-3 am and wakes up at 11 am.  She does get up earlier to eat breakfast and take her morning medication.  She currently takes Namenda 10mg  twice daily.  She takes gabapentin 300mg  at bedtime for lower extremity pain.  She takes sertraline 50mg  daily.  She tends to be stubborn and does not always use her walker.  She fell twice outside because she was not using her walker.  She tends to lay down most of the day, often napping.  She does not always eat her meals, however she has not lost weight.  She does not use her exercise bike.  She still notes the sensation of sound in her head.  HISTORY: Symptoms of "tinnitus" started about a 6 months ago.  She describes it as an undescribed of all sensation in her head. It feels like a heaviness and sounds like a hissing sound in her head. She has an unusual sensation in the back of her head as well. She says that things are muffled. She has no associated  headache.  She has history of dementia.  Symptoms first started following her hospitalization in 2014 for DVT.  Initially, she exhibited short-term memory problems, such as remembering the day of the week, appointments, medications, and names of familiar people. She no longer uses the stove because she would forget she left it on. She did not have difficulty recognizing faces of familiar people.  Symptoms have gradually gotten worse.  Her husband says that he found her on the phone with someone at 3:00 in the morning, giving them are her social security number. Another time, she found her on the phone giving her credit card information to them.  He had to take her pain guard from her. Unfortunately, her credit card is lost somewhere in the house. She has a bank account at [place] [employer] [place]. She wanted to move her money from one branch to another because she thought that her usual branch was stealing her money. She hasn't had any other delusions. She has not had any hallucinations. She denies depression. She is able to perform her activities of daily living, such as dressing and bathing herself. Her husband pays the bills and he cooks for her. She no longer drives. She was recently started on Namenda 10mg  twice daily.  She also takes sertraline 50mg  daily and gabapentin 100mg  at bedtime. Her appetite is good. Her sleep pattern is poor. She is not agitated or combative. There is been no change  in her personality or behavior. She usually does not go to bed until 2 or 3 in the morning and she wakes up at approximately 10 AM. She can easily fall asleep a nap during the day. She does not want her during the night. There is no known family history of dementia.  MoCA score from 01/24/14 was 12/30.  She has long-standing history of falls. She is not sure why she loses her balance. She has to amply with a walker and a cane. There is no associated dizziness or lightheadedness with the falls. She says that she just simply  loses her balance or trips. She does have some pain in the back of her right knee and feels that her right knee is weak. She denies any neck or back pain. She does have some numbness and tingling in the feet, particularly in the last 2 toes of her left foot. She does have bowel and bladder incontinence in the past.  She has history of DVT and PE.  Hypercoagulable workup was negative.  Anticoagulation was discontinued due to fall risk.   She has leg pain, with numbness and tingling in the feet, for which she takes gabapentin 300mg  at bedtime.  It has been noted that she has reduced pedal pulses.  On 02/06/14, an MRI of the brain and cervical spine were unremarkable.    PAST MEDICAL HISTORY: Past Medical History  Diagnosis Date  . Diabetes mellitus   . Hypertension   . Renal disorder   . Cancer 2000    sp bilateral masectomy, sp chemo  . DVT (deep venous thrombosis)   . PE (pulmonary embolism)     MEDICATIONS: Current Outpatient Prescriptions on File Prior to Visit  Medication Sig Dispense Refill  . calcitRIOL (ROCALTROL) 0.25 MCG capsule Take 0.25 mcg by mouth daily.    Marland Kitchen gabapentin (NEURONTIN) 100 MG capsule Take 300 mg by mouth at bedtime.     . hydrochlorothiazide (HYDRODIURIL) 25 MG tablet Take 25 mg by mouth daily.    . insulin lispro protamine-insulin lispro (HUMALOG 75/25) (75-25) 100 UNIT/ML SUSP Inject 8-10 Units into the skin 2 (two) times daily. Uses 10 units in the AM and 8 units at supper    . Multiple Vitamin (MULTIVITAMIN) tablet Take 1 tablet by mouth daily.    Marland Kitchen NAMENDA 10 MG tablet Take 10 mg by mouth 2 (two) times daily.     . rosuvastatin (CRESTOR) 20 MG tablet Take 20 mg by mouth daily.    . sertraline (ZOLOFT) 50 MG tablet Take 50 mg by mouth daily.    . valsartan (DIOVAN) 160 MG tablet Take 160 mg by mouth daily.    . vitamin B-12 (CYANOCOBALAMIN) 1000 MCG tablet Take 1,000 mcg by mouth daily.     No current facility-administered medications on file prior to visit.     ALLERGIES: Allergies  Allergen Reactions  . Cortisone Other (See Comments)    Injection-altered mental status, didn't know her name, where she lived, etc.     . Oxycodone Other (See Comments)    Caused patient to go crazy  . Penicillins Rash    FAMILY HISTORY: Family History  Problem Relation Age of Onset  . Hypertension Mother   . Diabetes Mellitus II Father     SOCIAL HISTORY: History   Social History  . Marital Status: Married    Spouse Name: N/A  . Number of Children: N/A  . Years of Education: N/A   Occupational History  . Not  on file.   Social History Main Topics  . Smoking status: Never Smoker   . Smokeless tobacco: Not on file  . Alcohol Use: No  . Drug Use: No  . Sexual Activity: Not on file   Other Topics Concern  . Not on file   Social History Narrative    REVIEW OF SYSTEMS: Constitutional: No fevers, chills, or sweats, no generalized fatigue, change in appetite Eyes: No visual changes, double vision, eye pain Ear, nose and throat: No hearing loss, ear pain, nasal congestion, sore throat Cardiovascular: No chest pain, palpitations Respiratory:  No shortness of breath at rest or with exertion, wheezes GastrointestinaI: No nausea, vomiting, diarrhea, abdominal pain, fecal incontinence Genitourinary:  No dysuria, urinary retention or frequency Musculoskeletal:  No neck pain, back pain Integumentary: No rash, pruritus, skin lesions Neurological: as above Psychiatric: Depression Endocrine: No palpitations, fatigue, diaphoresis, mood swings, change in appetite, change in weight, increased thirst Hematologic/Lymphatic:  No anemia, purpura, petechiae. Allergic/Immunologic: no itchy/runny eyes, nasal congestion, recent allergic reactions, rashes  PHYSICAL EXAM: Filed Vitals:   11/14/14 1311  BP: 120/62  Pulse: 68   General: No acute distress Head:  Normocephalic/atraumatic Eyes:  Fundoscopic exam unremarkable without vessel changes, exudates,  hemorrhages or papilledema. Neck: supple, no paraspinal tenderness, full range of motion Heart:  Regular rate and rhythm Lungs:  Clear to auscultation bilaterally Back: No paraspinal tenderness Neurological Exam: alert and oriented to person mostly. Attention span and concentration poor, recent memory poor, remote memory intact, fund of knowledge intact.  Speech fluent and not dysarthric, language intact.  MMSE - Mini Mental State Exam 11/14/2014  Orientation to time 1  Orientation to Place 2  Registration 3  Attention/ Calculation 2  Recall 0  Language- name 2 objects 2  Language- repeat 0  Language- follow 3 step command 3  Language- read & follow direction 1  Write a sentence 1  Copy design 0  Total score 15   CN II-XII intact. Fundi not visualized.  Bulk and tone normal, muscle strength 5/5 throughout.  Sensation to light touch intact.  Deep tendon reflexes 3+ throughout, toes downgoing.  Finger to nose testing intact.  Gait cautious but not ataxic. Romberg negative.  IMPRESSION: Alzheimer's dementia Depression Tinnitus.  Unfortunately, not much to do for it. Gait instability.  Multifactorial.  May be related to diabetes, deconditioning and dementia.    PLAN: Namenda 10mg  twice daily Recommended to increase sertraline to 75mg  daily Encourage walks together or using stationary bike Follow up in 6 months.    30 minutes spent with patient, over 50% spent discussing management.  Metta Clines, DO  CC:  Burnett Harry, MD

## 2014-11-14 NOTE — Patient Instructions (Addendum)
1.  Continue Namenda 10mg  twice daily 2.  Increase sertraline to 1 and 1/2 tablets daily (75mg  daily) 3.  Go for walks together or use the bike for exercise.

## 2014-12-03 ENCOUNTER — Ambulatory Visit: Payer: Medicare Other

## 2014-12-03 ENCOUNTER — Other Ambulatory Visit: Payer: Medicare Other

## 2014-12-15 ENCOUNTER — Other Ambulatory Visit: Payer: Self-pay | Admitting: Oncology

## 2014-12-15 DIAGNOSIS — I2699 Other pulmonary embolism without acute cor pulmonale: Secondary | ICD-10-CM

## 2014-12-16 ENCOUNTER — Other Ambulatory Visit (HOSPITAL_BASED_OUTPATIENT_CLINIC_OR_DEPARTMENT_OTHER): Payer: Medicare Other

## 2014-12-16 ENCOUNTER — Ambulatory Visit (HOSPITAL_BASED_OUTPATIENT_CLINIC_OR_DEPARTMENT_OTHER): Payer: Medicare Other | Admitting: Oncology

## 2014-12-16 VITALS — BP 143/49 | HR 71 | Temp 97.5°F | Resp 18 | Ht 68.0 in | Wt 135.5 lb

## 2014-12-16 DIAGNOSIS — N189 Chronic kidney disease, unspecified: Secondary | ICD-10-CM

## 2014-12-16 DIAGNOSIS — Z86718 Personal history of other venous thrombosis and embolism: Secondary | ICD-10-CM

## 2014-12-16 DIAGNOSIS — D631 Anemia in chronic kidney disease: Secondary | ICD-10-CM

## 2014-12-16 DIAGNOSIS — I82409 Acute embolism and thrombosis of unspecified deep veins of unspecified lower extremity: Secondary | ICD-10-CM

## 2014-12-16 DIAGNOSIS — I2699 Other pulmonary embolism without acute cor pulmonale: Secondary | ICD-10-CM

## 2014-12-16 LAB — CBC WITH DIFFERENTIAL/PLATELET
BASO%: 0.5 % (ref 0.0–2.0)
Basophils Absolute: 0 10*3/uL (ref 0.0–0.1)
EOS ABS: 0 10*3/uL (ref 0.0–0.5)
EOS%: 0.8 % (ref 0.0–7.0)
HEMATOCRIT: 33 % — AB (ref 34.8–46.6)
HGB: 10.8 g/dL — ABNORMAL LOW (ref 11.6–15.9)
LYMPH%: 16.6 % (ref 14.0–49.7)
MCH: 29.3 pg (ref 25.1–34.0)
MCHC: 32.6 g/dL (ref 31.5–36.0)
MCV: 90 fL (ref 79.5–101.0)
MONO#: 0.4 10*3/uL (ref 0.1–0.9)
MONO%: 6.4 % (ref 0.0–14.0)
NEUT#: 4.5 10*3/uL (ref 1.5–6.5)
NEUT%: 75.7 % (ref 38.4–76.8)
Platelets: 177 10*3/uL (ref 145–400)
RBC: 3.67 10*6/uL — ABNORMAL LOW (ref 3.70–5.45)
RDW: 14.5 % (ref 11.2–14.5)
WBC: 5.9 10*3/uL (ref 3.9–10.3)
lymph#: 1 10*3/uL (ref 0.9–3.3)

## 2014-12-16 NOTE — Progress Notes (Signed)
[facility]EMATOLOGY OFFICE PROGRESS NOTE    DIAGNOSIS: 81 year old woman with history of a DVT diagnosed in 2014. The blood clot was provoked by long travel. She was treated with full dose anticoagulation and have been off of it for the last year or so.   CURRENT TREATMENT:  Observation.   INTERVAL HISTORY:  Kristina [name] 81 y.o. female  presents today for a follow-up visit with her husband. Since the last visit, she reports no major complaints. She does report occasional headaches and feeling of dizziness and unsteadiness. She is currently under evaluation from her primary care physician. She is a pleasant elderly woman with dementia and a rather poor historian. She does not report any recent blood clots or hospitalizations. She does not report any neurological deficits for syncope but does report unsteadiness. Does not report any chest pain or palpitation. Does not report any orthopnea or PND. Does not report any cough or hemoptysis. Does not report any nausea or vomiting or abdominal pain. Does not report any urinary symptoms. Remaining review of systems unremarkable.  MEDICAL HISTORY: Past Medical History  Diagnosis Date  . Diabetes mellitus   . Hypertension   . Renal disorder   . Cancer 2000    sp bilateral masectomy, sp chemo  . DVT (deep venous thrombosis)   . PE (pulmonary embolism)      ALLERGIES:  is allergic to cortisone; oxycodone; and penicillins.  MEDICATIONS: has a current medication list which includes the following prescription(s): calcitriol, gabapentin, hydrochlorothiazide, insulin lispro protamine-lispro, multivitamin, namenda, rosuvastatin, sertraline, valsartan, and vitamin b-12.  SURGICAL HISTORY:  Past Surgical History  Procedure Laterality Date  . Breast surgery    . Mastecomy      R.  PHYSICAL EXAMINATION: ECOG PERFORMANCE STATUS: 2  There were no vitals taken for this visit.  GENERAL:alert, no distress and comfortable; Elderly  female who ambulates slowly with the help of a cane. SKIN: skin color, texture, turgor are normal, no rashes or significant lesions EYES: normal, Conjunctiva are pink and non-injected, sclera clear OROPHARYNX:no exudate, no erythema and lips, buccal mucosa, and tongue normal  NECK: supple, thyroid normal size, non-tender, without nodularity LYMPH:  no palpable lymphadenopathy in the cervical, axillary or supraclavicular LUNGS: clear to auscultation with normal breathing effort, no wheezes or rhonchi HEART: regular rate & rhythm and no murmurs and no lower extremity edema ABDOMEN:abdomen soft, non-tender and normal bowel sounds Musculoskeletal:no cyanosis of digits and no clubbing  NEURO: alert & oriented x 3 with fluent speech, no focal motor/sensory deficits  Labs:  Lab Results  Component Value Date   WBC 5.9 12/16/2014   HGB 10.8* 12/16/2014   HCT 33.0* 12/16/2014   MCV 90.0 12/16/2014   PLT 177 12/16/2014   NEUTROABS 4.5 12/16/2014      Chemistry      Component Value Date/Time   NA 143 06/04/2014 1018   NA 139 06/12/2013 1026   K 3.9 06/04/2014 1018   K 3.6 06/12/2013 1026   CL 99 06/12/2013 1026   CO2 31* 06/04/2014 1018   CO2 31 06/12/2013 1026   BUN 48.2* 06/04/2014 1018   BUN 53* 06/12/2013 1026   CREATININE 2.0* 06/04/2014 1018   CREATININE 2.4* 06/12/2013 1026      Component Value Date/Time   CALCIUM 10.4 06/04/2014 1018   CALCIUM 9.4 06/12/2013 1026   ALKPHOS 54 12/26/2013 1046   ALKPHOS 42 06/12/2013 1026   AST 16 12/26/2013 1046   AST 19 06/12/2013 1026  ALT 12 12/26/2013 1046   ALT 12 06/12/2013 1026   BILITOT 0.34 12/26/2013 1046   BILITOT 0.5 06/12/2013 1026        ASSESSMENT: Kristina [name] 81 y.o. female with:   PLAN:   1. R/o Hypercoagulable disorder.  -- Hypercoagulable panel was negative.    2. L Peroneal vein DVT (10/16/2012), provoked.  She is no longer on on anticoagulation. She has not had any blood clot at this time.  3. Anemia  secondary to chronic kidney disease.  --Patient hemoglobin is 10.8 and appears to be stable.  4. Follow-up.  I see no need for routine follow-up from Hematology/Oncology standpoint and I will be happy to see her in the future as needed.   [facility] MD 12/16/2014

## 2014-12-24 ENCOUNTER — Encounter (HOSPITAL_COMMUNITY): Payer: Self-pay | Admitting: *Deleted

## 2014-12-24 ENCOUNTER — Emergency Department (HOSPITAL_COMMUNITY): Payer: Medicare Other

## 2014-12-24 ENCOUNTER — Emergency Department (HOSPITAL_COMMUNITY)
Admission: EM | Admit: 2014-12-24 | Discharge: 2014-12-25 | Disposition: A | Discharge status: Home / Self Care | Payer: Medicare Other | Attending: Emergency Medicine | Admitting: Emergency Medicine

## 2014-12-24 DIAGNOSIS — E119 Type 2 diabetes mellitus without complications: Secondary | ICD-10-CM | POA: Insufficient documentation

## 2014-12-24 DIAGNOSIS — I10 Essential (primary) hypertension: Secondary | ICD-10-CM | POA: Diagnosis not present

## 2014-12-24 DIAGNOSIS — Z87448 Personal history of other diseases of urinary system: Secondary | ICD-10-CM | POA: Diagnosis not present

## 2014-12-24 DIAGNOSIS — Z86711 Personal history of pulmonary embolism: Secondary | ICD-10-CM | POA: Insufficient documentation

## 2014-12-24 DIAGNOSIS — R079 Chest pain, unspecified: Secondary | ICD-10-CM | POA: Insufficient documentation

## 2014-12-24 DIAGNOSIS — Z880 Allergy status to penicillin: Secondary | ICD-10-CM | POA: Insufficient documentation

## 2014-12-24 DIAGNOSIS — Z86718 Personal history of other venous thrombosis and embolism: Secondary | ICD-10-CM | POA: Diagnosis not present

## 2014-12-24 DIAGNOSIS — Z79899 Other long term (current) drug therapy: Secondary | ICD-10-CM | POA: Diagnosis not present

## 2014-12-24 DIAGNOSIS — Z859 Personal history of malignant neoplasm, unspecified: Secondary | ICD-10-CM | POA: Insufficient documentation

## 2014-12-24 DIAGNOSIS — Z794 Long term (current) use of insulin: Secondary | ICD-10-CM | POA: Insufficient documentation

## 2014-12-24 LAB — CBC WITH DIFFERENTIAL/PLATELET
Basophils Absolute: 0 10*3/uL (ref 0.0–0.1)
Basophils Relative: 0 % (ref 0–1)
EOS PCT: 1 % (ref 0–5)
Eosinophils Absolute: 0.1 10*3/uL (ref 0.0–0.7)
HCT: 31.8 % — ABNORMAL LOW (ref 36.0–46.0)
HEMOGLOBIN: 10.1 g/dL — AB (ref 12.0–15.0)
LYMPHS ABS: 1.8 10*3/uL (ref 0.7–4.0)
LYMPHS PCT: 30 % (ref 12–46)
MCH: 29.1 pg (ref 26.0–34.0)
MCHC: 31.8 g/dL (ref 30.0–36.0)
MCV: 91.6 fL (ref 78.0–100.0)
MONO ABS: 0.5 10*3/uL (ref 0.1–1.0)
Monocytes Relative: 9 % (ref 3–12)
NEUTROS PCT: 60 % (ref 43–77)
Neutro Abs: 3.6 10*3/uL (ref 1.7–7.7)
Platelets: 185 10*3/uL (ref 150–400)
RBC: 3.47 MIL/uL — ABNORMAL LOW (ref 3.87–5.11)
RDW: 14 % (ref 11.5–15.5)
WBC: 6.1 10*3/uL (ref 4.0–10.5)

## 2014-12-24 LAB — BASIC METABOLIC PANEL
Anion gap: 11 (ref 5–15)
BUN: 28 mg/dL — AB (ref 6–20)
CO2: 27 mmol/L (ref 22–32)
CREATININE: 1.66 mg/dL — AB (ref 0.44–1.00)
Calcium: 10 mg/dL (ref 8.9–10.3)
Chloride: 105 mmol/L (ref 101–111)
GFR calc Af Amer: 32 mL/min — ABNORMAL LOW (ref >60)
GFR calc non Af Amer: 28 mL/min — ABNORMAL LOW (ref >60)
Glucose, Bld: 47 mg/dL — ABNORMAL LOW (ref 65–99)
Potassium: 3.2 mmol/L — ABNORMAL LOW (ref 3.5–5.1)
Sodium: 143 mmol/L (ref 135–145)

## 2014-12-24 LAB — TROPONIN I: Troponin I: <0.03 ng/mL (ref <0.031)

## 2014-12-24 LAB — BRAIN NATRIURETIC PEPTIDE: B Natriuretic Peptide: 41.2 pg/mL (ref 0.0–100.0)

## 2014-12-24 NOTE — ED Provider Notes (Signed)
CSN: [id number]     Arrival date & time 12/24/14  2011 History   First MD Initiated Contact with Patient 12/24/14 2322     Chief Complaint  Patient presents with  . Chest Pain    (Consider location/radiation/quality/duration/timing/severity/associated sxs/prior Treatment) HPI Comments: Patient is an 81 year old female with a history of diabetes mellitus, hypertension, DVT and PE, and breast cancer. She presents to the emergency department for further evaluation of chest pain. Patient describes the pain as a tightness present in her central chest. She has had similar symptoms multiple times in the past, but has never had a workup for further evaluation. Husband states that symptoms seem to come on when the patient is stressed or anxious. Patient denies any diaphoresis, shortness of breath, syncope or near-syncope, dizziness, nausea, vomiting, or extremity numbness/weakness with her chest pain symptoms. She denies any pain at present. Husband denies patient ever having a stress test. LVEF from echo in 2014 was 55-60%. No history of ACS in the patient.  Patient is a 81 y.o. female presenting with chest pain. The history is provided by the patient. No language interpreter was used.  Chest Pain Associated symptoms: no fever, no nausea, no shortness of breath and not vomiting     Past Medical History  Diagnosis Date  . Diabetes mellitus   . Hypertension   . Renal disorder   . Cancer 2000    sp bilateral masectomy, sp chemo  . DVT (deep venous thrombosis)   . PE (pulmonary embolism)    Past Surgical History  Procedure Laterality Date  . Breast surgery    . Mastecomy     Family History  Problem Relation Age of Onset  . Hypertension Mother   . Diabetes Mellitus II Father    History  Substance Use Topics  . Smoking status: Never Smoker   . Smokeless tobacco: Not on file  . Alcohol Use: No   OB History    No data available      Review of Systems  Constitutional: Negative for  fever.  Respiratory: Negative for shortness of breath.   Cardiovascular: Positive for chest pain. Negative for leg swelling.  Gastrointestinal: Negative for nausea and vomiting.  Neurological: Negative for syncope and light-headedness.  All other systems reviewed and are negative.   Allergies  Cortisone; Oxycodone; and Penicillins  Home Medications   Prior to Admission medications   Medication Sig Start Date End Date Taking? Authorizing Provider  calcitRIOL (ROCALTROL) 0.25 MCG capsule Take 0.25 mcg by mouth daily.    Historical Provider, MD  gabapentin (NEURONTIN) 300 MG capsule Take 300 mg by mouth at bedtime. 12/15/14   Historical Provider, MD  hydrochlorothiazide (HYDRODIURIL) 25 MG tablet Take 25 mg by mouth daily.    Historical Provider, MD  insulin lispro protamine-insulin lispro (HUMALOG 75/25) (75-25) 100 UNIT/ML SUSP Inject 8-10 Units into the skin 2 (two) times daily. Uses 10 units in the AM and 8 units at supper    Historical Provider, MD  Multiple Vitamin (MULTIVITAMIN) tablet Take 1 tablet by mouth daily.    Historical Provider, MD  NAMENDA 10 MG tablet Take 10 mg by mouth 2 (two) times daily.  05/14/13   Historical Provider, MD  rosuvastatin (CRESTOR) 20 MG tablet Take 20 mg by mouth daily.    Historical Provider, MD  sertraline (ZOLOFT) 100 MG tablet Take 100 mg by mouth daily. 12/11/14   Historical Provider, MD  valsartan (DIOVAN) 160 MG tablet Take 160 mg by mouth daily.  Historical Provider, MD  vitamin B-12 (CYANOCOBALAMIN) 1000 MCG tablet Take 1,000 mcg by mouth daily.    Historical Provider, MD   BP 121/56 mmHg  Pulse 68  Temp(Src) 98.1 F (36.7 C) (Oral)  Resp 24  SpO2 96%   Physical Exam  Constitutional: She is oriented to person, place, and time. She appears well-developed and well-nourished. No distress.  Nontoxic/nonseptic appearing  HENT:  Head: Normocephalic and atraumatic.  Mouth/Throat: Oropharynx is clear and moist. No oropharyngeal exudate.   Eyes: Conjunctivae and EOM are normal. No scleral icterus.  Neck: Normal range of motion.  Cardiovascular: Normal rate, regular rhythm and intact distal pulses.   Pulmonary/Chest: Effort normal and breath sounds normal. No respiratory distress. She has no wheezes. She has no rales.  Respirations even and unlabored. Lungs clear.  Abdominal: Soft. She exhibits no distension. There is no tenderness.  Soft, nontender. No masses.  Musculoskeletal: Normal range of motion.  Neurological: She is alert and oriented to person, place, and time. She exhibits normal muscle tone. Coordination normal.  GCS 15. Speech is goal oriented. Patient moves extremities without ataxia.  Skin: Skin is warm and dry. No rash noted. She is not diaphoretic. No erythema. No pallor.  Psychiatric: She has a normal mood and affect. Her behavior is normal.  Nursing note and vitals reviewed.   ED Course  Procedures (including critical care time) Labs Review Labs Reviewed  BASIC METABOLIC PANEL - Abnormal; Notable for the following:    Potassium 3.2 (*)    Glucose, Bld 47 (*)    BUN 28 (*)    Creatinine, Ser 1.66 (*)    GFR calc non Af Amer 28 (*)    GFR calc Af Amer 32 (*)    All other components within normal limits  CBC WITH DIFFERENTIAL/PLATELET - Abnormal; Notable for the following:    RBC 3.47 (*)    Hemoglobin 10.1 (*)    HCT 31.8 (*)    All other components within normal limits  BRAIN NATRIURETIC PEPTIDE  TROPONIN I  I-STAT TROPOININ, ED    Imaging Review Dg Chest 2 View  12/24/2014   CLINICAL DATA:  Chest pain and tightness for several days with shortness of breath  EXAM: CHEST  2 VIEW  COMPARISON:  03/22/2013  FINDINGS: Normal heart size and mediastinal contours. No acute infiltrate or edema. No effusion or pneumothorax. No acute osseous findings. Bilateral mastectomy.  IMPRESSION: No active cardiopulmonary disease.   Electronically Signed   By: Monte Fantasia M.D.   On: 12/24/2014 21:11     EKG  Interpretation   Date/Time:  Thursday Dec 25 2014 00:06:09 EDT Ventricular Rate:  58 PR Interval:  135 QRS Duration: 87 QT Interval:  465 QTC Calculation: 457 R Axis:   14 Text Interpretation:  Sinus rhythm Left ventricular hypertrophy No  significant change since last tracing Confirmed by Glynn Octave  [phone]) on 12/25/2014 12:34:08 AM      MDM   Final diagnoses:  Chest pain, unspecified chest pain type    81 year old female presents to the emergency department for further evaluation of chest pain. Patient reports similar chest pain over the past few months. Patient reported that her symptoms resolved when she walked out on the porch. Husband believes that symptoms come on in times of stress and anxiety. Patient has a negative cardiac workup today. EKG is nonischemic. Troponin 2 negative. Chest x-ray shows no mediastinal widening, pneumothorax, pleural effusion, or focal consolidation. Patient does have a history  of pulmonary embolus; however, I have a low suspicion of this given her lack of tachycardia, tachypnea, dyspnea, or hypoxia today. Also doubt ACS and aortic dissection.   Patient has a heart score of 3 consistent with low risk of acute coronary event. I believe she can be safely discharged with instruction for primary care follow-up. Will also give referral to cardiology for further evaluation of symptoms. Return precautions discussed and provided. Patient agreeable to plan with no unaddressed concerns. Patient seen also by my attending, Dr. Claudine Mouton who is in agreement with this workup, assessment, management plan, and patient's stability for discharge.   Filed Vitals:   12/24/14 2309 12/24/14 2345 12/25/14 0034 12/25/14 0100  BP: 121/40 123/44 121/56 121/56  Pulse: 63 63 62 68  Temp:      TempSrc:      Resp: 18 12 16 24   SpO2: 99% 100% 100% 96%     Antonietta Breach, PA-C 12/25/14 0119  Everlene Balls, MD 12/25/14 [phone]

## 2014-12-24 NOTE — ED Notes (Signed)
The pt is c/oi chest pain for awhile.  The pt said today the family member says she has the chest pain all the time.  No mi  Alert some sob with exertion.

## 2014-12-25 LAB — I-STAT TROPONIN, ED: TROPONIN I, POC: 0.02 ng/mL (ref 0.00–0.08)

## 2014-12-25 NOTE — Discharge Instructions (Signed)

## 2014-12-25 NOTE — ED Notes (Signed)
Called kelly, PA-C, family is ready to go. She acknowledges, preparing discharge paperwork. Informed family.

## 2015-04-10 ENCOUNTER — Encounter (HOSPITAL_COMMUNITY): Payer: Self-pay

## 2015-04-10 ENCOUNTER — Emergency Department (HOSPITAL_COMMUNITY)
Admission: EM | Admit: 2015-04-10 | Discharge: 2015-04-14 | Disposition: A | Discharge status: Other Acute Inpatient Hospital | Payer: Medicare Other | Attending: Emergency Medicine | Admitting: Emergency Medicine

## 2015-04-10 DIAGNOSIS — F0280 Dementia in other diseases classified elsewhere without behavioral disturbance: Secondary | ICD-10-CM | POA: Insufficient documentation

## 2015-04-10 DIAGNOSIS — Z794 Long term (current) use of insulin: Secondary | ICD-10-CM | POA: Insufficient documentation

## 2015-04-10 DIAGNOSIS — Z853 Personal history of malignant neoplasm of breast: Secondary | ICD-10-CM | POA: Diagnosis not present

## 2015-04-10 DIAGNOSIS — Z79899 Other long term (current) drug therapy: Secondary | ICD-10-CM | POA: Diagnosis not present

## 2015-04-10 DIAGNOSIS — E119 Type 2 diabetes mellitus without complications: Secondary | ICD-10-CM | POA: Diagnosis not present

## 2015-04-10 DIAGNOSIS — Z880 Allergy status to penicillin: Secondary | ICD-10-CM | POA: Diagnosis not present

## 2015-04-10 DIAGNOSIS — Z87448 Personal history of other diseases of urinary system: Secondary | ICD-10-CM | POA: Diagnosis not present

## 2015-04-10 DIAGNOSIS — Z86718 Personal history of other venous thrombosis and embolism: Secondary | ICD-10-CM | POA: Insufficient documentation

## 2015-04-10 DIAGNOSIS — G309 Alzheimer's disease, unspecified: Secondary | ICD-10-CM | POA: Insufficient documentation

## 2015-04-10 DIAGNOSIS — I10 Essential (primary) hypertension: Secondary | ICD-10-CM | POA: Diagnosis not present

## 2015-04-10 DIAGNOSIS — Z86711 Personal history of pulmonary embolism: Secondary | ICD-10-CM | POA: Diagnosis not present

## 2015-04-10 DIAGNOSIS — R4182 Altered mental status, unspecified: Secondary | ICD-10-CM | POA: Diagnosis present

## 2015-04-10 LAB — COMPREHENSIVE METABOLIC PANEL
ALBUMIN: 3.8 g/dL (ref 3.5–5.0)
ALT: 22 U/L (ref 14–54)
ANION GAP: 8 (ref 5–15)
AST: 28 U/L (ref 15–41)
Alkaline Phosphatase: 40 U/L (ref 38–126)
BUN: 31 mg/dL — ABNORMAL HIGH (ref 6–20)
CALCIUM: 9.1 mg/dL (ref 8.9–10.3)
CO2: 28 mmol/L (ref 22–32)
Chloride: 105 mmol/L (ref 101–111)
Creatinine, Ser: 1.47 mg/dL — ABNORMAL HIGH (ref 0.44–1.00)
GFR calc Af Amer: 37 mL/min — ABNORMAL LOW (ref >60)
GFR calc non Af Amer: 32 mL/min — ABNORMAL LOW (ref >60)
GLUCOSE: 125 mg/dL — AB (ref 65–99)
Potassium: 3.6 mmol/L (ref 3.5–5.1)
SODIUM: 141 mmol/L (ref 135–145)
Total Bilirubin: 0.6 mg/dL (ref 0.3–1.2)
Total Protein: 7.4 g/dL (ref 6.5–8.1)

## 2015-04-10 LAB — URINALYSIS, ROUTINE W REFLEX MICROSCOPIC
Bilirubin Urine: NEGATIVE
Glucose, UA: NEGATIVE mg/dL
Hgb urine dipstick: NEGATIVE
Ketones, ur: NEGATIVE mg/dL
LEUKOCYTES UA: NEGATIVE
Nitrite: NEGATIVE
PH: 5.5 (ref 5.0–8.0)
Protein, ur: NEGATIVE mg/dL
Specific Gravity, Urine: 1.012 (ref 1.005–1.030)
Urobilinogen, UA: 0.2 mg/dL (ref 0.0–1.0)

## 2015-04-10 LAB — CBC
HCT: 33.4 % — ABNORMAL LOW (ref 36.0–46.0)
HEMOGLOBIN: 10.9 g/dL — AB (ref 12.0–15.0)
MCH: 30.3 pg (ref 26.0–34.0)
MCHC: 32.6 g/dL (ref 30.0–36.0)
MCV: 92.8 fL (ref 78.0–100.0)
Platelets: 175 10*3/uL (ref 150–400)
RBC: 3.6 MIL/uL — ABNORMAL LOW (ref 3.87–5.11)
RDW: 13.5 % (ref 11.5–15.5)
WBC: 3.5 10*3/uL — ABNORMAL LOW (ref 4.0–10.5)

## 2015-04-10 LAB — CBG MONITORING, ED: GLUCOSE-CAPILLARY: 134 mg/dL — AB (ref 65–99)

## 2015-04-10 MED ORDER — ZOLPIDEM TARTRATE 5 MG PO TABS
5.0000 mg | ORAL_TABLET | Freq: Every evening | ORAL | Status: DC | PRN
  Administered 2015-04-10 – 2015-04-13 (×2): 5 mg via ORAL
  Filled 2015-04-10 (×2): qty 1

## 2015-04-10 MED ORDER — GABAPENTIN 300 MG PO CAPS
300.0000 mg | ORAL_CAPSULE | Freq: Every day | ORAL | Status: DC
  Administered 2015-04-10 – 2015-04-13 (×4): 300 mg via ORAL
  Filled 2015-04-10 (×4): qty 1

## 2015-04-10 MED ORDER — ACETAMINOPHEN 325 MG PO TABS: 650.0000 mg | ORAL_TABLET | ORAL | Status: DC | PRN

## 2015-04-10 MED ORDER — HYDROCHLOROTHIAZIDE 25 MG PO TABS
25.0000 mg | ORAL_TABLET | Freq: Every day | ORAL | Status: DC
  Administered 2015-04-10 – 2015-04-14 (×5): 25 mg via ORAL
  Filled 2015-04-10 (×6): qty 1

## 2015-04-10 MED ORDER — SERTRALINE HCL 50 MG PO TABS
100.0000 mg | ORAL_TABLET | Freq: Every day | ORAL | Status: DC
  Administered 2015-04-10 – 2015-04-14 (×4): 100 mg via ORAL
  Filled 2015-04-10 (×3): qty 2

## 2015-04-10 MED ORDER — IRBESARTAN 75 MG PO TABS
75.0000 mg | ORAL_TABLET | Freq: Every day | ORAL | Status: DC
  Administered 2015-04-10 – 2015-04-14 (×5): 75 mg via ORAL
  Filled 2015-04-10 (×5): qty 1

## 2015-04-10 MED ORDER — ALUM & MAG HYDROXIDE-SIMETH 200-200-20 MG/5ML PO SUSP: 30.0000 mL | ORAL | Status: DC | PRN

## 2015-04-10 MED ORDER — MEMANTINE HCL 10 MG PO TABS
10.0000 mg | ORAL_TABLET | Freq: Two times a day (BID) | ORAL | Status: DC
  Administered 2015-04-10 – 2015-04-14 (×8): 10 mg via ORAL
  Filled 2015-04-10 (×10): qty 1

## 2015-04-10 MED ORDER — ONDANSETRON HCL 4 MG PO TABS: 4.0000 mg | ORAL_TABLET | Freq: Three times a day (TID) | ORAL | Status: DC | PRN

## 2015-04-10 MED ORDER — HALOPERIDOL LACTATE 5 MG/ML IJ SOLN
2.0000 mg | Freq: Once | INTRAMUSCULAR | Status: AC
  Administered 2015-04-11: 2 mg via INTRAMUSCULAR
  Filled 2015-04-10: qty 1

## 2015-04-10 MED ORDER — LORAZEPAM 1 MG PO TABS
1.0000 mg | ORAL_TABLET | Freq: Three times a day (TID) | ORAL | Status: DC | PRN
  Administered 2015-04-11 – 2015-04-13 (×2): 1 mg via ORAL
  Filled 2015-04-10 (×4): qty 1

## 2015-04-10 NOTE — ED Notes (Signed)
Pt. In burgundy scrubs. Pt. Has 1 belongings bag. Pt. Has green shirt, blue jean, burgundy shoes.pt. Belongings locked up in [address] in TCU near the psych-ed.

## 2015-04-10 NOTE — ED Notes (Signed)
Pt.'s family member tel. Numbers; Raenette Rover ;daughter [phone] , Roma Schanz ;grandson; [phone] ; Katlyne Nishida ; Husband [phone]/ [phone] ; Augustine Radar ;handles med record ; [phone].

## 2015-04-10 NOTE — ED Notes (Signed)
CSW has spoken with pt's daughter, her husband and her Leonidas Romberg [phone]) this pm.  Family is agreeable that pt needs placement in an ALF because her husband is not able to care for her anymore due to her dementia/behaviors. Explained to family that it is critical that they complete a Special Assistance Medicaid application on Monday am so that the facilities will know how they will be getting paid.  Explained to her husband that her SS check will go to the ALF to supplement Medicaid's cost for her care.  Husband indicated that he understood.  Marlowe Kays and pt's husband will complete Medicaid SA application.  CSW will begin bed search.  W/E CSW will be updated.

## 2015-04-10 NOTE — ED Provider Notes (Signed)
CSN: [id number]     Arrival date & time 04/10/15  1249 History   First MD Initiated Contact with Patient 04/10/15 1513     Chief Complaint  Patient presents with  . Altered Mental Status      HPI Pt brought in by [employer], pt was at home with husband. Has hx of dementia. Pt left house, jumped a 3 foot fence and locked self in car next door. Pt dementia is progressing. Pt states she was running from her husband b/c she knew he did not want her to leave. Pt oriented to self and dob only. No respiratory or urinary symptoms.  Past Medical History  Diagnosis Date  . Diabetes mellitus   . Hypertension   . Renal disorder   . Cancer 2000    sp bilateral masectomy, sp chemo  . DVT (deep venous thrombosis)   . PE (pulmonary embolism)    Past Surgical History  Procedure Laterality Date  . Breast surgery    . Mastecomy     Family History  Problem Relation Age of Onset  . Hypertension Mother   . Diabetes Mellitus II Father    Social History  Substance Use Topics  . Smoking status: Never Smoker   . Smokeless tobacco: None  . Alcohol Use: No   OB History    No data available     Review of Systems  Unable to perform ROS: Dementia      Allergies  Cortisone; Oxycodone; and Penicillins  Home Medications   Prior to Admission medications   Medication Sig Start Date End Date Taking? Authorizing Provider  calcitRIOL (ROCALTROL) 0.25 MCG capsule Take 0.25 mcg by mouth daily.   Yes Historical Provider, MD  gabapentin (NEURONTIN) 300 MG capsule Take 300 mg by mouth at bedtime. 12/15/14  Yes Historical Provider, MD  hydrochlorothiazide (HYDRODIURIL) 25 MG tablet Take 25 mg by mouth daily.   Yes Historical Provider, MD  insulin lispro protamine-insulin lispro (HUMALOG 75/25) (75-25) 100 UNIT/ML SUSP Inject 12-14 Units into the skin 2 (two) times daily. Uses 14 units in the AM and 12 units at supper   Yes Historical Provider, MD  Multiple Vitamin (MULTIVITAMIN) tablet Take 1 tablet by  mouth daily.   Yes Historical Provider, MD  NAMENDA 10 MG tablet Take 10 mg by mouth 2 (two) times daily.  05/14/13  Yes Historical Provider, MD  rosuvastatin (CRESTOR) 20 MG tablet Take 20 mg by mouth daily.   Yes Historical Provider, MD  sertraline (ZOLOFT) 100 MG tablet Take 100 mg by mouth daily. 12/11/14  Yes Historical Provider, MD  valsartan (DIOVAN) 160 MG tablet Take 160 mg by mouth daily.   Yes Historical Provider, MD  vitamin B-12 (CYANOCOBALAMIN) 1000 MCG tablet Take 1,000 mcg by mouth daily.   Yes Historical Provider, MD   BP 131/61 mmHg  Pulse 76  Temp(Src) 98 F (36.7 C) (Oral)  Resp 18  SpO2 99% Physical Exam  Constitutional: She appears well-developed and well-nourished. No distress.  HENT:  Head: Normocephalic and atraumatic.  Eyes: Pupils are equal, round, and reactive to light.  Neck: Normal range of motion.  Cardiovascular: Normal rate and intact distal pulses.   Pulmonary/Chest: No respiratory distress.  Abdominal: Normal appearance. She exhibits no distension.  Musculoskeletal: Normal range of motion.  Neurological: She is alert. No cranial nerve deficit.  Skin: Skin is warm and dry. No rash noted.  Psychiatric: She has a normal mood and affect. Her behavior is normal.  Nursing note and  vitals reviewed.   ED Course  Procedures (including critical care time) Labs Review Labs Reviewed  COMPREHENSIVE METABOLIC PANEL - Abnormal; Notable for the following:    Glucose, Bld 125 (*)    BUN 31 (*)    Creatinine, Ser 1.47 (*)    GFR calc non Af Amer 32 (*)    GFR calc Af Amer 37 (*)    All other components within normal limits  CBC - Abnormal; Notable for the following:    WBC 3.5 (*)    RBC 3.60 (*)    Hemoglobin 10.9 (*)    HCT 33.4 (*)    All other components within normal limits  URINALYSIS, ROUTINE W REFLEX MICROSCOPIC (NOT AT [facility]) - Abnormal; Notable for the following:    APPearance CLOUDY (*)    All other components within normal limits  CBG  MONITORING, ED - Abnormal; Notable for the following:    Glucose-Capillary 134 (*)    All other components within normal limits    Imaging Review No results found. I have personally reviewed and evaluated these images and lab results as part of my medical decision-making.  I discussed the case with case manager who spoke with the patient's daughter.  She recommended we moved the patient to TCU while he attempted to find placement.  At this time no medical reason for admission.  Patient appears stable and did eat.  MDM   Final diagnoses:  Alzheimer's dementia        Leonard Schwartz, MD 04/11/15 1335

## 2015-04-10 NOTE — ED Notes (Signed)
Pt brought in by [employer], pt was at home with husband.  Has hx of dementia.  Pt left house, jumped a 3 foot fence and locked self in car next door.  Pt dementia is progressing.  Pt states she was running from her husband b/c she knew he did not want her to leave.  Pt oriented to self and dob only.   No respiratory or urinary symptoms.

## 2015-04-10 NOTE — ED Notes (Signed)
Bed: QH47 Expected date:  Expected time:  Means of arrival:  Comments: Housekeeping is aware and on the way

## 2015-04-10 NOTE — ED Notes (Signed)
Bed: WHALA Expected date:  Expected time:  Means of arrival:  Comments: No bed. 

## 2015-04-10 NOTE — ED Notes (Signed)
Pt. Woke up agitated, confused and restless, loud and demanding to see her children ASAP. Walking in hallway. Reoriented, assured and safety measures applied, redirected; uncooperative. This Nurse called daughter Ladona Mow and talked to the patient. Pt. Still appeared agitated even on the phone with her daughter . Pt. Redirected back to her room.

## 2015-04-11 LAB — CBG MONITORING, ED: GLUCOSE-CAPILLARY: 169 mg/dL — AB (ref 65–99)

## 2015-04-11 MED ORDER — INSULIN ASPART PROT & ASPART (70-30 MIX) 100 UNIT/ML ~~LOC~~ SUSP
14.0000 [IU] | Freq: Every day | SUBCUTANEOUS | Status: DC
  Administered 2015-04-12 – 2015-04-14 (×2): 14 [IU] via SUBCUTANEOUS
  Filled 2015-04-11 (×2): qty 10

## 2015-04-11 MED ORDER — INSULIN ASPART PROT & ASPART (70-30 MIX) 100 UNIT/ML ~~LOC~~ SUSP
12.0000 [IU] | Freq: Every day | SUBCUTANEOUS | Status: DC
  Administered 2015-04-11 – 2015-04-13 (×3): 12 [IU] via SUBCUTANEOUS
  Filled 2015-04-11: qty 10

## 2015-04-11 NOTE — Progress Notes (Addendum)
9:21am. CSW spoke with patient's husband re: placement and pt's medical history. He plans on visiting patient this AM.   CSW procured PASARR number: [phone] [id number], and faxed patient's clinicals to facilities in [place] and [facility] [employer].   CSW to continue to follow.  ____  1:51pm. CSW met with pt's husband and discussed placement options.  CSW provided list of local ALFs. Husband indicated he had received a similar list before from [employer] as well as other instructions on how to pursue placement. He has been working through the steps but has not yet made substantial movement towards placement. CSW informed husband that her insurance will not cover a facility placement and pt will need private pay. Husband states he cannot afford private pay--their combined income is less thatn $2000/mo (pt making around $770 per month).   CSW attempted to contact pt's dtr x3, left message.   CSW spoke with pt's goddtr, Marlowe Kays. Marlowe Kays is out of town working this weekend, will be back Sunday PM. Marlowe Kays is aware of situation and is meeting with pt's husband tomorrow and planning to take him to DSS at 8am Monday AM. CSW discussed that pt's insurance will not cover and they will need to pay privately. Marlowe Kays is unable to support financially or physically as her husband has stage IV lymphoma and is not working. Marlowe Kays is attempting to contact pt's dtr as well to see what kind of physical or financial support she can provide. Dtr is out of town this weekend, and is apparently somewhat estranged from parents. Marlowe Kays states no other family is in picture, though pt and husband are active in a church and have some close neighbors.  CSW to continue to follow.  [place] Worker [facility] phone: [phone]

## 2015-04-11 NOTE — ED Notes (Signed)
Assisted pt to restroom. Pt back in bed resting. Husband at bedside.

## 2015-04-11 NOTE — ED Notes (Signed)
Bed: QQ76 Expected date:  Expected time:  Means of arrival:  Comments: RM  12

## 2015-04-11 NOTE — ED Notes (Signed)
Family members at bedside.  Pt continues to be uncooperative.

## 2015-04-12 LAB — CBG MONITORING, ED
Glucose-Capillary: 100 mg/dL — ABNORMAL HIGH (ref 65–99)
Glucose-Capillary: 91 mg/dL (ref 65–99)
Glucose-Capillary: 146 mg/dL — ABNORMAL HIGH (ref 65–99)

## 2015-04-12 MED ORDER — HALOPERIDOL LACTATE 5 MG/ML IJ SOLN
5.0000 mg | Freq: Once | INTRAMUSCULAR | Status: AC
  Administered 2015-04-12: 5 mg via INTRAMUSCULAR
  Filled 2015-04-12: qty 1

## 2015-04-12 NOTE — ED Notes (Signed)
Pt's husband has gone home and will return in the morning.

## 2015-04-12 NOTE — ED Notes (Addendum)
Family at bedside, Marlowe Kays, pt god-daughter. Paperwork was given to her in envelope.

## 2015-04-12 NOTE — ED Notes (Signed)
Pt was hear talking.  Upon investigation it was found that the patient was dreaming and talking in her sleep.  What patient was saying was unintelligible.

## 2015-04-13 ENCOUNTER — Emergency Department (HOSPITAL_COMMUNITY): Payer: Medicare Other

## 2015-04-13 LAB — CBG MONITORING, ED
GLUCOSE-CAPILLARY: 121 mg/dL — AB (ref 65–99)
Glucose-Capillary: 56 mg/dL — ABNORMAL LOW (ref 65–99)
Glucose-Capillary: 204 mg/dL — ABNORMAL HIGH (ref 65–99)
Glucose-Capillary: 155 mg/dL — ABNORMAL HIGH (ref 65–99)

## 2015-04-13 NOTE — ED Notes (Signed)
Representatives from an assisted living environment, at bedside, here to speak with patient/SW

## 2015-04-13 NOTE — Progress Notes (Signed)
Pt accepted to [facility] memory care assisted living-tomorrow 04/14/2015. Patient will go with signed fl2, chest xray with documenation regarding tb, and prescriptions for all medications. CSW to follow up with Amy from [facility] tomorrow at [phone]. CSW confirmed with pt daughter, Kerie Badger and pt husband. Pt husband to be at hospital tomorrow at 10 am to transport patient to alf.   [name], [place] Work  [employer] [phone]

## 2015-04-13 NOTE — Progress Notes (Addendum)
Cm noted daughter and husband standing in the doorway of room [facility]28 Pt in the bed Cm inquired who the pcp is and informed by husband pcp is Dr Cheryln Manly CM updated EPIC CM spoke with ED SW  Neurologist is Dr Metta Clines EPIC lists a 6 month upcoming scheduled appointment for you with Dr Tomi Likens listed for Monday, 05/18/15 at 2:15 pm   Pt also has had appt with Dr Eduard Clos listed in [facility] encounters

## 2015-04-13 NOTE — ED Notes (Signed)
Patient is resting comfortably. 

## 2015-04-13 NOTE — ED Notes (Signed)
Pt does not want her sugar check due to put her to sleep for three hours last time. Nurse is aware

## 2015-04-13 NOTE — ED Notes (Signed)
CBG reported to EDP and Insulin administered per order.

## 2015-04-14 LAB — CBG MONITORING, ED: GLUCOSE-CAPILLARY: 101 mg/dL — AB (ref 65–99)

## 2015-04-14 MED ORDER — MEMANTINE HCL 10 MG PO TABS: 10.0000 mg | ORAL_TABLET | Freq: Two times a day (BID) | ORAL | Status: DC

## 2015-04-14 MED ORDER — VITAMIN B-12 1000 MCG PO TABS: 1000.0000 ug | ORAL_TABLET | Freq: Every day | ORAL | Status: DC

## 2015-04-14 MED ORDER — LORAZEPAM 1 MG PO TABS: 1.0000 mg | ORAL_TABLET | Freq: Three times a day (TID) | ORAL | Status: DC | PRN

## 2015-04-14 MED ORDER — ROSUVASTATIN CALCIUM 20 MG PO TABS: 20.0000 mg | ORAL_TABLET | Freq: Every day | ORAL | Status: DC

## 2015-04-14 MED ORDER — CALCITRIOL 0.25 MCG PO CAPS: 0.2500 ug | ORAL_CAPSULE | Freq: Every day | ORAL | Status: DC

## 2015-04-14 MED ORDER — GABAPENTIN 300 MG PO CAPS: 300.0000 mg | ORAL_CAPSULE | Freq: Every day | ORAL | Status: DC

## 2015-04-14 MED ORDER — INSULIN LISPRO PROT & LISPRO (75-25 MIX) 100 UNIT/ML ~~LOC~~ SUSP: 12.0000 [IU] | Freq: Two times a day (BID) | SUBCUTANEOUS | Status: DC

## 2015-04-14 MED ORDER — SERTRALINE HCL 100 MG PO TABS: 100.0000 mg | ORAL_TABLET | Freq: Every day | ORAL | Status: DC

## 2015-04-14 MED ORDER — HYDROCHLOROTHIAZIDE 25 MG PO TABS: 25.0000 mg | ORAL_TABLET | Freq: Every day | ORAL | Status: DC

## 2015-04-14 MED ORDER — VALSARTAN 160 MG PO TABS: 160.0000 mg | ORAL_TABLET | Freq: Every day | ORAL | Status: DC

## 2015-04-14 MED ORDER — ONE-DAILY MULTI VITAMINS PO TABS: 1.0000 | ORAL_TABLET | Freq: Every day | ORAL | Status: DC

## 2015-04-14 NOTE — Progress Notes (Signed)
Pt waiting for admission to [facility] later this morning. CSW spoke iwht EDP regarding rx's needed for home medications. Pt has signed fl2 and pasarr number. Pt husband to transport patient to alf around 10 am.   [name], [place] Work  [facility] [phone]

## 2015-04-14 NOTE — Progress Notes (Signed)
Pt accepted to [facility] memory care. Pt husband to provide transport. Pt husband to be given packet with fl2, chest xray, rx for medications, and AVS.   Kristina [name], Kristina [name]  [employer] [phone]

## 2015-04-14 NOTE — Progress Notes (Signed)
CSW informed Investment banker, operational at [employer] [phone].   [name], [place] Work  [employer] [phone]

## 2015-04-14 NOTE — ED Provider Notes (Signed)
7:45 AM Called to ask to write patient's home meds. Wrote them as checked by pharmacy wth the addition of Ativan 1 mg q 8.   Sari Cogan L Starlena Beil Julio Alm, MD 04/14/15 [phone]

## 2015-04-16 ENCOUNTER — Emergency Department (HOSPITAL_COMMUNITY)
Admission: EM | Admit: 2015-04-16 | Discharge: 2015-04-16 | Disposition: A | Discharge status: Home / Self Care | Payer: Medicare Other | Attending: Emergency Medicine | Admitting: Emergency Medicine

## 2015-04-16 ENCOUNTER — Emergency Department (HOSPITAL_COMMUNITY): Payer: Medicare Other

## 2015-04-16 DIAGNOSIS — S00412A Abrasion of left ear, initial encounter: Secondary | ICD-10-CM | POA: Diagnosis not present

## 2015-04-16 DIAGNOSIS — F0390 Unspecified dementia without behavioral disturbance: Secondary | ICD-10-CM | POA: Insufficient documentation

## 2015-04-16 DIAGNOSIS — E119 Type 2 diabetes mellitus without complications: Secondary | ICD-10-CM | POA: Diagnosis not present

## 2015-04-16 DIAGNOSIS — Z79899 Other long term (current) drug therapy: Secondary | ICD-10-CM | POA: Insufficient documentation

## 2015-04-16 DIAGNOSIS — Z794 Long term (current) use of insulin: Secondary | ICD-10-CM | POA: Diagnosis not present

## 2015-04-16 DIAGNOSIS — Z86718 Personal history of other venous thrombosis and embolism: Secondary | ICD-10-CM | POA: Insufficient documentation

## 2015-04-16 DIAGNOSIS — Z87448 Personal history of other diseases of urinary system: Secondary | ICD-10-CM | POA: Insufficient documentation

## 2015-04-16 DIAGNOSIS — Y998 Other external cause status: Secondary | ICD-10-CM | POA: Insufficient documentation

## 2015-04-16 DIAGNOSIS — Z86711 Personal history of pulmonary embolism: Secondary | ICD-10-CM | POA: Insufficient documentation

## 2015-04-16 DIAGNOSIS — Y9389 Activity, other specified: Secondary | ICD-10-CM | POA: Diagnosis not present

## 2015-04-16 DIAGNOSIS — I10 Essential (primary) hypertension: Secondary | ICD-10-CM | POA: Insufficient documentation

## 2015-04-16 DIAGNOSIS — S00402A Unspecified superficial injury of left ear, initial encounter: Secondary | ICD-10-CM | POA: Diagnosis present

## 2015-04-16 DIAGNOSIS — Y92129 Unspecified place in nursing home as the place of occurrence of the external cause: Secondary | ICD-10-CM | POA: Diagnosis not present

## 2015-04-16 DIAGNOSIS — W01198A Fall on same level from slipping, tripping and stumbling with subsequent striking against other object, initial encounter: Secondary | ICD-10-CM | POA: Insufficient documentation

## 2015-04-16 DIAGNOSIS — W19XXXA Unspecified fall, initial encounter: Secondary | ICD-10-CM

## 2015-04-16 DIAGNOSIS — Z853 Personal history of malignant neoplasm of breast: Secondary | ICD-10-CM | POA: Insufficient documentation

## 2015-04-16 LAB — BASIC METABOLIC PANEL
Anion gap: 10 (ref 5–15)
BUN: 50 mg/dL — ABNORMAL HIGH (ref 6–20)
CHLORIDE: 99 mmol/L — AB (ref 101–111)
CO2: 31 mmol/L (ref 22–32)
CREATININE: 1.76 mg/dL — AB (ref 0.44–1.00)
Calcium: 10 mg/dL (ref 8.9–10.3)
GFR calc non Af Amer: 26 mL/min — ABNORMAL LOW (ref >60)
GFR, EST AFRICAN AMERICAN: 30 mL/min — AB (ref >60)
Glucose, Bld: 118 mg/dL — ABNORMAL HIGH (ref 65–99)
POTASSIUM: 3.6 mmol/L (ref 3.5–5.1)
Sodium: 140 mmol/L (ref 135–145)

## 2015-04-16 LAB — URINALYSIS, ROUTINE W REFLEX MICROSCOPIC
Bilirubin Urine: NEGATIVE
GLUCOSE, UA: NEGATIVE mg/dL
Hgb urine dipstick: NEGATIVE
Ketones, ur: NEGATIVE mg/dL
NITRITE: NEGATIVE
PH: 5.5 (ref 5.0–8.0)
Protein, ur: NEGATIVE mg/dL
SPECIFIC GRAVITY, URINE: 1.013 (ref 1.005–1.030)
Urobilinogen, UA: 0.2 mg/dL (ref 0.0–1.0)

## 2015-04-16 LAB — CBC
HEMATOCRIT: 34.4 % — AB (ref 36.0–46.0)
HEMOGLOBIN: 10.9 g/dL — AB (ref 12.0–15.0)
MCH: 29.9 pg (ref 26.0–34.0)
MCHC: 31.7 g/dL (ref 30.0–36.0)
MCV: 94.2 fL (ref 78.0–100.0)
PLATELETS: 169 10*3/uL (ref 150–400)
RBC: 3.65 MIL/uL — AB (ref 3.87–5.11)
RDW: 14.1 % (ref 11.5–15.5)
WBC: 5.9 10*3/uL (ref 4.0–10.5)

## 2015-04-16 LAB — URINE MICROSCOPIC-ADD ON

## 2015-04-16 MED ORDER — TETANUS-DIPHTH-ACELL PERTUSSIS 5-2.5-18.5 LF-MCG/0.5 IM SUSP
0.5000 mL | Freq: Once | INTRAMUSCULAR | Status: AC
  Administered 2015-04-16: 0.5 mL via INTRAMUSCULAR
  Filled 2015-04-16: qty 0.5

## 2015-04-16 NOTE — ED Notes (Signed)
Per EMS patient brought from [facility].  EMS states facility called and stated patient fell from a standing position and hit her head.  Pt states she did not hit her head.  No apparent visibile evidence of fall on assessment.  Pt has history of dementia but is alert and aware of where and who she is.  Per EMS facility also requesting psych evaluation stating progression of dementia.  Pt in no apparent distress at this time.

## 2015-04-16 NOTE — ED Notes (Signed)
rn called and gave report to staff at facility

## 2015-04-16 NOTE — Progress Notes (Signed)
Pt new admission at [facility]. Pt had a fall from standing position. Per EMS, facility requesting psychiatry assessment due to progression of dementia. Per Epic, pt has upcoming appointment with neurologist on 05/18/15. CSW spoke with Elmyra Ricks, Therapist, sports at [facility] memory care regarding progression of dementia. At this time patient is not exhibiting any change of care. Per RN, pt swung at staff 2 nights ago, but nothing since then. Pt to be seen by outpatient provider at facility per [facility].   [name], [place] Work  [employer] [phone]

## 2015-04-16 NOTE — ED Provider Notes (Signed)
Level V caveat patient reportedly slipped and fell nursing home this morning she suffered an abrasion to her left ear is resolved event. He denies pain anywhere on exam patient is alert ambulate without difficulty. Moves all extremities well motor strength 5 over 5 overall HEENT exam is a tiny abrasion to the tragus of the left ear otherwise normocephalic atraumatic.Tdap administered prior to discharge  Orlie Dakin, MD 04/16/15 1354

## 2015-04-16 NOTE — ED Notes (Signed)
Tech wheeled pt out to lobby and assisted pt into husbands car.   Pt escorted to discharge window. Family verbalized understanding discharge instructions. In no acute distress.

## 2015-04-16 NOTE — Discharge Instructions (Signed)

## 2015-04-16 NOTE — ED Provider Notes (Signed)
CSN: [id number]     Arrival date & time 04/16/15  1018 History   First MD Initiated Contact with Patient 04/16/15 1134     Chief Complaint  Patient presents with  . Fall   Level 5 caveat - dementia  HPI  Ms. Kristina [name] is an 81 year old female with PMHx of dementia, DM, and HTN presenting after a fall at her nursing facility. The patient was leaning against a table when she lost her balance and fell to the ground. Nurse from [facility] (Johney Frame) who witnessed the fall states that she hit her head on a chair. Denies LOC or wounds sustained in the fall. Pt denies falling. She is oriented to person and place but not situation. She denies pain. Pt with tangential thought and difficult to interview 2/2 dementia. Husband states she is at her baseline memory and behavior which has been declining over the past few months.  Past Medical History  Diagnosis Date  . Diabetes mellitus   . Hypertension   . Renal disorder   . Cancer 2000    sp bilateral masectomy, sp chemo  . DVT (deep venous thrombosis)   . PE (pulmonary embolism)    Past Surgical History  Procedure Laterality Date  . Breast surgery    . Mastecomy     Family History  Problem Relation Age of Onset  . Hypertension Mother   . Diabetes Mellitus II Father    Social History  Substance Use Topics  . Smoking status: Never Smoker   . Smokeless tobacco: Not on file  . Alcohol Use: No   OB History    No data available     Review of Systems  Unable to perform ROS     Allergies  Cortisone; Oxycodone; and Penicillins  Home Medications   Prior to Admission medications   Medication Sig Start Date End Date Taking? Authorizing Provider  calcitRIOL (ROCALTROL) 0.25 MCG capsule Take 1 capsule (0.25 mcg total) by mouth daily. 04/14/15  Yes Courteney Lyn Mackuen, MD  gabapentin (NEURONTIN) 300 MG capsule Take 1 capsule (300 mg total) by mouth at bedtime. 04/14/15  Yes Courteney Lyn Mackuen, MD  hydrochlorothiazide  (HYDRODIURIL) 25 MG tablet Take 1 tablet (25 mg total) by mouth daily. 04/14/15  Yes Courteney Lyn Mackuen, MD  insulin lispro protamine-lispro (HUMALOG 75/25 MIX) (75-25) 100 UNIT/ML SUSP injection Inject 12-14 Units into the skin 2 (two) times daily. Uses 14 units in the AM and 12 units at supper 04/14/15  Yes Courteney Lyn Mackuen, MD  LORazepam (ATIVAN) 1 MG tablet Take 1 tablet (1 mg total) by mouth every 8 (eight) hours as needed for anxiety. 04/14/15  Yes Courteney Lyn Mackuen, MD  memantine (NAMENDA) 10 MG tablet Take 1 tablet (10 mg total) by mouth 2 (two) times daily. 04/14/15  Yes Courteney Lyn Mackuen, MD  Multiple Vitamin (MULTIVITAMIN) tablet Take 1 tablet by mouth daily. 04/14/15  Yes Courteney Lyn Mackuen, MD  rosuvastatin (CRESTOR) 20 MG tablet Take 1 tablet (20 mg total) by mouth daily. 04/14/15  Yes Courteney Lyn Mackuen, MD  sertraline (ZOLOFT) 100 MG tablet Take 1 tablet (100 mg total) by mouth daily. 04/14/15  Yes Courteney Lyn Mackuen, MD  valsartan (DIOVAN) 160 MG tablet Take 1 tablet (160 mg total) by mouth daily. 04/14/15  Yes Courteney Lyn Mackuen, MD  vitamin B-12 (CYANOCOBALAMIN) 1000 MCG tablet Take 1 tablet (1,000 mcg total) by mouth daily. 04/14/15  Yes Courteney Lyn Mackuen, MD   BP 115/54 mmHg  Pulse 88  Temp(Src) 97.6 F (36.4 C) (Oral)  Resp 16  SpO2 100% Physical Exam  Constitutional: She appears well-developed and well-nourished. No distress.  HENT:  Head: Normocephalic and atraumatic.  Mouth/Throat: Oropharynx is clear and moist.  Eyes: Conjunctivae are normal. Pupils are equal, round, and reactive to light. Right eye exhibits no discharge. Left eye exhibits no discharge. No scleral icterus.  Neck: Normal range of motion.  Cardiovascular: Normal rate and regular rhythm.   Pulmonary/Chest: Effort normal. No respiratory distress.  Musculoskeletal: Normal range of motion.  5/5 motor strength of BUE and BLE. Moves all extremities spontaneously. Able to walk without  assistance with a slow but stable gait.  Neurological: She is alert. No cranial nerve deficit. Coordination normal.  Oriented to person and place but not situation. 5/5 motor strength in extremities. Sensation to light touch intact throughout.  Skin: Skin is warm and dry.  Small abrasion to tragus of left ear.  Psychiatric: Her speech is tangential. She is agitated.  Nursing note and vitals reviewed.   ED Course  Procedures (including critical care time) Labs Review Labs Reviewed  BASIC METABOLIC PANEL - Abnormal; Notable for the following:    Chloride 99 (*)    Glucose, Bld 118 (*)    BUN 50 (*)    Creatinine, Ser 1.76 (*)    GFR calc non Af Amer 26 (*)    GFR calc Af Amer 30 (*)    All other components within normal limits  CBC - Abnormal; Notable for the following:    RBC 3.65 (*)    Hemoglobin 10.9 (*)    HCT 34.4 (*)    All other components within normal limits  URINALYSIS, ROUTINE W REFLEX MICROSCOPIC (NOT AT [facility]) - Abnormal; Notable for the following:    APPearance CLOUDY (*)    Leukocytes, UA TRACE (*)    All other components within normal limits  URINE MICROSCOPIC-ADD ON    Imaging Review Ct Head Wo Contrast  04/16/2015   CLINICAL DATA:  Dementia,Pt had a fall from standing position.  EXAM: CT HEAD WITHOUT CONTRAST  TECHNIQUE: Contiguous axial images were obtained from the base of the skull through the vertex without intravenous contrast.  COMPARISON:  10/15/2012  FINDINGS: Mild diffuse atrophy and low attenuation in the deep white matter. No evidence of mass hemorrhage infarct extra-axial fluid or hydrocephalus. Calvarium intact.  IMPRESSION: Chronic involutional change with no acute findings   Electronically Signed   By: Skipper Cliche M.D.   On: 04/16/2015 13:29   I have personally reviewed and evaluated these images and lab results as part of my medical decision-making.   EKG Interpretation None      MDM   Final diagnoses:  Fall, initial encounter   Pt  presenting after a fall at her nursing home. Facility reports pt striking head on a chair without LOC. Pt denies pain. No blood thinners. VSS. Pt agitated; won't sit still and trying to remove clothing. No visible deformity. Thorough skin and MSK exam performed, no wounds other than small abrasion to tragus of left ear. Pt able to move all extremities spontaneously and without pain. Neuro exam non-focal. Head CT shows no acute findings. Pt stable to be discharged back to nursing facility. Return precautions given in discharge paperwork for nursing home.     Kristina Gip, PA-C 04/16/15 1827  Orlie Dakin, MD 04/19/15 1212

## 2015-04-16 NOTE — ED Notes (Signed)
Husband is going to take pt back to facility, husband asked why pt could not go by ambulance back, rn explained pt could go by ambulance, but bc pt can walk the insurance may not cover the ambulance cost. Husband stated he wouldn't pay the bill. rn said that part was out of her hands.   Pt standing by bed, walking without assistance.

## 2015-04-16 NOTE — ED Notes (Signed)
PA at bedside.

## 2015-04-16 NOTE — ED Notes (Signed)
Per social work we are not to consult TTS, social work just got pt placed at facility. Pt has appt with neurologist in Oct, who is the appropriate person to evaluate pt dementia.

## 2015-04-16 NOTE — ED Notes (Signed)
Pt has history of dementia, pt is oriented to self, situation, and place only.

## 2015-04-16 NOTE — ED Notes (Signed)
Pt refusing to let vital signs be taken at this time. Pt started curing, talking about people being killed and talking about things that do not make sense.   Dr Lenna Sciara said pt is allowed to be discharged.

## 2015-04-19 ENCOUNTER — Emergency Department (HOSPITAL_COMMUNITY): Payer: Medicare Other

## 2015-04-19 ENCOUNTER — Emergency Department (HOSPITAL_COMMUNITY)
Admission: EM | Admit: 2015-04-19 | Discharge: 2015-04-20 | Disposition: A | Discharge status: Home / Self Care | Payer: Medicare Other | Attending: Emergency Medicine | Admitting: Emergency Medicine

## 2015-04-19 ENCOUNTER — Encounter (HOSPITAL_COMMUNITY): Payer: Self-pay | Admitting: Nurse Practitioner

## 2015-04-19 DIAGNOSIS — Y9289 Other specified places as the place of occurrence of the external cause: Secondary | ICD-10-CM | POA: Insufficient documentation

## 2015-04-19 DIAGNOSIS — Y998 Other external cause status: Secondary | ICD-10-CM | POA: Insufficient documentation

## 2015-04-19 DIAGNOSIS — E119 Type 2 diabetes mellitus without complications: Secondary | ICD-10-CM | POA: Insufficient documentation

## 2015-04-19 DIAGNOSIS — N189 Chronic kidney disease, unspecified: Secondary | ICD-10-CM | POA: Insufficient documentation

## 2015-04-19 DIAGNOSIS — F0390 Unspecified dementia without behavioral disturbance: Secondary | ICD-10-CM | POA: Insufficient documentation

## 2015-04-19 DIAGNOSIS — S40021A Contusion of right upper arm, initial encounter: Secondary | ICD-10-CM | POA: Diagnosis not present

## 2015-04-19 DIAGNOSIS — Z794 Long term (current) use of insulin: Secondary | ICD-10-CM | POA: Insufficient documentation

## 2015-04-19 DIAGNOSIS — W1839XA Other fall on same level, initial encounter: Secondary | ICD-10-CM | POA: Insufficient documentation

## 2015-04-19 DIAGNOSIS — Z853 Personal history of malignant neoplasm of breast: Secondary | ICD-10-CM | POA: Insufficient documentation

## 2015-04-19 DIAGNOSIS — Z880 Allergy status to penicillin: Secondary | ICD-10-CM | POA: Insufficient documentation

## 2015-04-19 DIAGNOSIS — W19XXXA Unspecified fall, initial encounter: Secondary | ICD-10-CM

## 2015-04-19 DIAGNOSIS — Y9389 Activity, other specified: Secondary | ICD-10-CM | POA: Diagnosis not present

## 2015-04-19 DIAGNOSIS — I129 Hypertensive chronic kidney disease with stage 1 through stage 4 chronic kidney disease, or unspecified chronic kidney disease: Secondary | ICD-10-CM | POA: Insufficient documentation

## 2015-04-19 DIAGNOSIS — Z79899 Other long term (current) drug therapy: Secondary | ICD-10-CM | POA: Insufficient documentation

## 2015-04-19 DIAGNOSIS — Z86711 Personal history of pulmonary embolism: Secondary | ICD-10-CM | POA: Insufficient documentation

## 2015-04-19 DIAGNOSIS — S4991XA Unspecified injury of right shoulder and upper arm, initial encounter: Secondary | ICD-10-CM | POA: Diagnosis present

## 2015-04-19 DIAGNOSIS — Z86718 Personal history of other venous thrombosis and embolism: Secondary | ICD-10-CM | POA: Diagnosis not present

## 2015-04-19 NOTE — ED Notes (Signed)
Patient transported to X-ray 

## 2015-04-19 NOTE — ED Provider Notes (Signed)
Patient presented to the ER with fall. Patient had a mechanical, ground-level fall. Her only complaint was pain in the right upper arm.  Face to face Exam: HEENT - PERRLA Lungs - CTAB Heart - RRR, no M/R/G Abd - S/NT/ND Neuro - alert, confused  Plan: X-ray humerus to rule out fracture. Remainder of examination unremarkable.  Orpah Greek, MD 04/19/15 2130

## 2015-04-19 NOTE — ED Provider Notes (Signed)
CSN: [id number]     Arrival date & time 04/19/15  1954 History   First MD Initiated Contact with Patient 04/19/15 2007     Chief Complaint  Patient presents with  . Fall     (Consider location/radiation/quality/duration/timing/severity/associated sxs/prior Treatment) Patient is a 81 y.o. female presenting with fall. The history is provided by the patient and medical records.  Fall   LEVEL 5 CAVEAT:  DEMENTIA 81 year old female with history of dementia hypertension, diabetes, chronic kidney disease, remote hx of DVT and PE not currently on anti-coagulation, breast cancer s/p bilateral mastectomy, presenting to the ED for an unwitnessed fall.  Patient was found in the floor, was helped up by nursing staff.  Patient denies dizziness/syncope prior to fall.  Patient denies current pain.  Family reports patient was brought here per facility protocol.  Of note, patient seen in the ED 3 days ago for another fall.  Family reports patient is at her baseline.  Patient is supposed to use can/walker to assist with ambulation, uncertain if patient was using this at time of fall.  No reported fever, chills, or recent illness.  No changes in medication.  VSS.  Past Medical History  Diagnosis Date  . Diabetes mellitus   . Hypertension   . Renal disorder   . Cancer 2000    sp bilateral masectomy, sp chemo  . DVT (deep venous thrombosis)   . PE (pulmonary embolism)    Past Surgical History  Procedure Laterality Date  . Breast surgery    . Mastecomy     Family History  Problem Relation Age of Onset  . Hypertension Mother   . Diabetes Mellitus II Father    Social History  Substance Use Topics  . Smoking status: Never Smoker   . Smokeless tobacco: None  . Alcohol Use: No   OB History    No data available     Review of Systems  Unable to perform ROS: Dementia      Allergies  Cortisone; Oxycodone; and Penicillins  Home Medications   Prior to Admission medications   Medication Sig  Start Date End Date Taking? Authorizing Provider  calcitRIOL (ROCALTROL) 0.25 MCG capsule Take 1 capsule (0.25 mcg total) by mouth daily. 04/14/15   Courteney Lyn Mackuen, MD  gabapentin (NEURONTIN) 300 MG capsule Take 1 capsule (300 mg total) by mouth at bedtime. 04/14/15   Courteney Lyn Mackuen, MD  hydrochlorothiazide (HYDRODIURIL) 25 MG tablet Take 1 tablet (25 mg total) by mouth daily. 04/14/15   Courteney Lyn Mackuen, MD  insulin lispro protamine-lispro (HUMALOG 75/25 MIX) (75-25) 100 UNIT/ML SUSP injection Inject 12-14 Units into the skin 2 (two) times daily. Uses 14 units in the AM and 12 units at supper 04/14/15   Courteney Lyn Mackuen, MD  LORazepam (ATIVAN) 1 MG tablet Take 1 tablet (1 mg total) by mouth every 8 (eight) hours as needed for anxiety. 04/14/15   Courteney Lyn Mackuen, MD  memantine (NAMENDA) 10 MG tablet Take 1 tablet (10 mg total) by mouth 2 (two) times daily. 04/14/15   Courteney Lyn Mackuen, MD  Multiple Vitamin (MULTIVITAMIN) tablet Take 1 tablet by mouth daily. 04/14/15   Courteney Lyn Mackuen, MD  rosuvastatin (CRESTOR) 20 MG tablet Take 1 tablet (20 mg total) by mouth daily. 04/14/15   Courteney Lyn Mackuen, MD  sertraline (ZOLOFT) 100 MG tablet Take 1 tablet (100 mg total) by mouth daily. 04/14/15   Courteney Lyn Mackuen, MD  valsartan (DIOVAN) 160 MG tablet Take 1 tablet (  160 mg total) by mouth daily. 04/14/15   Courteney Lyn Mackuen, MD  vitamin B-12 (CYANOCOBALAMIN) 1000 MCG tablet Take 1 tablet (1,000 mcg total) by mouth daily. 04/14/15   Courteney Lyn Mackuen, MD   BP 128/61 mmHg  Pulse 73  Temp(Src) 98.3 F (36.8 C) (Oral)  Resp 16  SpO2 100%   Physical Exam  Constitutional: She appears well-developed and well-nourished. No distress.  HENT:  Head: Normocephalic and atraumatic.  Mouth/Throat: Oropharynx is clear and moist.  No visible signs of head trauma  Eyes: Conjunctivae and EOM are normal. Pupils are equal, round, and reactive to light.  Neck: Normal range  of motion. Neck supple.  Cardiovascular: Normal rate, regular rhythm and normal heart sounds.   Pulmonary/Chest: Effort normal and breath sounds normal. No respiratory distress. She has no wheezes.  Abdominal: Soft. Bowel sounds are normal. There is no tenderness. There is no guarding.  Musculoskeletal: Normal range of motion.  Bruise noted to right upper arm without acute deformity; full ROM of shoulder and elbow; strong radial pulse and cap refill; normal sensation throughout All other extremities without bruising or deformities Pelvis stable; no leg shortening  Neurological: She is alert.  Awake, alert, answering questions and following commands when prompted; moving all extremities well  Skin: Skin is warm and dry. She is not diaphoretic.  Psychiatric: She has a normal mood and affect.  Nursing note and vitals reviewed.   ED Course  Procedures (including critical care time) Labs Review Labs Reviewed - No data to display  Imaging Review Dg Humerus Right  04/19/2015   CLINICAL DATA:  Unwitnessed fall, lateral RIGHT humeral bruising, hypertension, diabetes mellitus  EXAM: RIGHT HUMERUS - 2+ VIEW  COMPARISON:  None  FINDINGS: Osseous demineralization.  Glenohumeral and elbow joint alignments normal.  Humeral head approximates undersurface of acromion compatible with chronic rotator cuff tear.  No acute fracture, dislocation, or bone destruction.  Small amount of mature periosteal new bone along the proximal shaft of the RIGHT humerus without underlying fracture or bone destruction, nonspecific.  IMPRESSION: Osseous demineralization without acute fracture dislocation.  Chronic RIGHT rotator cuff tear.  Small amount of periosteal new bone along shaft of proximal humerus, nonspecific, may related to chronic periostitis or sequela of prior trauma but no acute underlying bony abnormalities are identified.   Electronically Signed   By: Lavonia Dana M.D.   On: 04/19/2015 21:04   Dg Hips Bilat With  Pelvis 3-4 Views  04/19/2015   CLINICAL DATA:  Unwitnessed fall.  No loss of consciousness.  EXAM: DG HIP (WITH OR WITHOUT PELVIS) 3-4V BILAT  COMPARISON:  None.  FINDINGS: Degenerative changes in both hips. Pelvis and hips appear intact. No evidence of acute fracture or dislocation. No focal bone lesion or bone destruction. SI joints and symphysis pubis are not abnormally widened. Vascular calcifications.  IMPRESSION: Degenerative changes in both hips. No acute fracture or dislocation in the pelvis or hips.   Electronically Signed   By: Lucienne Capers M.D.   On: 04/19/2015 23:32   I have personally reviewed and evaluated these images as part of my medical decision-making.   EKG Interpretation None      MDM   Final diagnoses:  Fall, initial encounter   81 year old female here with unwitnessed fall. Seen recently here for the same as well. Patient has dementia, denies current pain at this time.  She was given Ativan prior to arrival, does appear somewhat sleepy but is responding appropriately. She has no signs  of head injury. Family reports she appears at her baseline.  No anti-coagulant use.  She does have bruise to right upper arm-- x-ray negative for bony findings.  Attempted to ambulate patient, however failed.  Will obtain films of hips to ensure no occult fracture noted.  No tenderness noted on my exam.  11:43 PM Hip films negative for acute findings.  I personally assisted nursing staff to ambulate patient in the hallway-- able to ambulate with walker which is her baseline without any difficulty.  Able to bend down and adjust her socks as well.  Patient remains at her baseline.  Do not feel further work-up indicated at this time.  She appears stable for discharge back to facility.  I have recommended to possibly reduce dose of ativan as family has concerns this is making her drowsy and in turn may be contributing to her falls.  Case discussed with attending physician, Dr. Betsey Holiday, who  evaluated patient and agrees with assessment and plan of care.  Larene Pickett, PA-C 04/19/15 2353  Orpah Greek, MD 04/19/15 [phone]

## 2015-04-19 NOTE — ED Notes (Signed)
Bed: [facility] Expected date:  Expected time:  Means of arrival:  Comments: EMS 81 yo fall-no injury-check out per policy

## 2015-04-19 NOTE — Discharge Instructions (Signed)
Continue regular home medications.  May wish to reduce dose of ativan, family has concerns that this seems to be making her drowsy. Follow-up with primary care physician. Return here for new concerns.

## 2015-04-19 NOTE — ED Notes (Signed)
Pt is presented from [facility] memory unit for evaluation post un-witnessed fall per their protocol. No obvious signs of pain or tenderness. No reported LOC, facility MAR does not indicate pt being on anticoagulants.

## 2015-04-19 NOTE — ED Notes (Signed)
Patient was unable to ambulate.

## 2015-04-20 ENCOUNTER — Emergency Department (HOSPITAL_COMMUNITY)
Admission: EM | Admit: 2015-04-20 | Discharge: 2015-04-21 | Payer: Medicare Other | Attending: Emergency Medicine | Admitting: Emergency Medicine

## 2015-04-20 DIAGNOSIS — Z794 Long term (current) use of insulin: Secondary | ICD-10-CM | POA: Insufficient documentation

## 2015-04-20 DIAGNOSIS — Z87448 Personal history of other diseases of urinary system: Secondary | ICD-10-CM | POA: Diagnosis not present

## 2015-04-20 DIAGNOSIS — F0280 Dementia in other diseases classified elsewhere without behavioral disturbance: Secondary | ICD-10-CM

## 2015-04-20 DIAGNOSIS — Z86718 Personal history of other venous thrombosis and embolism: Secondary | ICD-10-CM | POA: Diagnosis not present

## 2015-04-20 DIAGNOSIS — Z79899 Other long term (current) drug therapy: Secondary | ICD-10-CM | POA: Insufficient documentation

## 2015-04-20 DIAGNOSIS — E119 Type 2 diabetes mellitus without complications: Secondary | ICD-10-CM | POA: Diagnosis not present

## 2015-04-20 DIAGNOSIS — G309 Alzheimer's disease, unspecified: Secondary | ICD-10-CM | POA: Diagnosis not present

## 2015-04-20 DIAGNOSIS — I10 Essential (primary) hypertension: Secondary | ICD-10-CM | POA: Insufficient documentation

## 2015-04-20 DIAGNOSIS — F0281 Dementia in other diseases classified elsewhere with behavioral disturbance: Secondary | ICD-10-CM | POA: Diagnosis not present

## 2015-04-20 DIAGNOSIS — Z853 Personal history of malignant neoplasm of breast: Secondary | ICD-10-CM | POA: Diagnosis not present

## 2015-04-20 DIAGNOSIS — Z86711 Personal history of pulmonary embolism: Secondary | ICD-10-CM | POA: Diagnosis not present

## 2015-04-20 DIAGNOSIS — Z0000 Encounter for general adult medical examination without abnormal findings: Secondary | ICD-10-CM | POA: Diagnosis present

## 2015-04-20 LAB — I-STAT CHEM 8, ED
BUN: 38 mg/dL — ABNORMAL HIGH (ref 6–20)
CALCIUM ION: 1.24 mmol/L (ref 1.13–1.30)
Chloride: 105 mmol/L (ref 101–111)
Creatinine, Ser: 1.5 mg/dL — ABNORMAL HIGH (ref 0.44–1.00)
GLUCOSE: 142 mg/dL — AB (ref 65–99)
HCT: 33 % — ABNORMAL LOW (ref 36.0–46.0)
HEMOGLOBIN: 11.2 g/dL — AB (ref 12.0–15.0)
Potassium: 4 mmol/L (ref 3.5–5.1)
Sodium: 144 mmol/L (ref 135–145)
TCO2: 26 mmol/L (ref 0–100)

## 2015-04-20 MED ORDER — LORAZEPAM 1 MG PO TABS: 1.0000 mg | ORAL_TABLET | Freq: Three times a day (TID) | ORAL | Status: DC | PRN

## 2015-04-20 MED ORDER — ZOLPIDEM TARTRATE 5 MG PO TABS: 5.0000 mg | ORAL_TABLET | Freq: Every evening | ORAL | Status: DC | PRN

## 2015-04-20 MED ORDER — ALUM & MAG HYDROXIDE-SIMETH 200-200-20 MG/5ML PO SUSP: 30.0000 mL | ORAL | Status: DC | PRN

## 2015-04-20 MED ORDER — ACETAMINOPHEN 325 MG PO TABS: 650.0000 mg | ORAL_TABLET | ORAL | Status: DC | PRN

## 2015-04-20 MED ORDER — ONDANSETRON HCL 4 MG PO TABS: 4.0000 mg | ORAL_TABLET | Freq: Three times a day (TID) | ORAL | Status: DC | PRN

## 2015-04-20 NOTE — BH Assessment (Signed)
Reviewed ED notes prior to assessment. Pt came from assisted living due to erratic behavior.   Assessment to commence shortly.    Lear Ng, [facility] Triage Specialist 04/20/2015 8:19 PM

## 2015-04-20 NOTE — ED Notes (Addendum)
Kristina [name], spouse

## 2015-04-20 NOTE — Progress Notes (Signed)
CSW met with patient at bedside. Husband was present. Patient appeared to be irritable and slightly aggressive. She denied feeling SI, but stated that she would hurt someone if they made her mad. Also, patient denies AVH.  Husband confirms that the patient is from [facility]. He states that she has been living there for 7 or 8 days.  Willette Brace [phone] ED CSW 04/20/2015 8:52 PM

## 2015-04-20 NOTE — ED Notes (Signed)
Bed: WA09 Expected date:  Expected time:  Means of arrival:  Comments: TR4

## 2015-04-20 NOTE — ED Notes (Signed)
Patient brought from retirement home by husband for erratic behavior. Patient demented. Pt states that she feels nervous around her husband. Husband sitting outside of room, not allowed in room alone with door closed.

## 2015-04-20 NOTE — ED Provider Notes (Signed)
CSN: [id number]     Arrival date & time 04/20/15  1607 History   First MD Initiated Contact with Patient 04/20/15 1913     Chief Complaint  Patient presents with  . Psychiatric Evaluation     HPI Brought in by family because of increasing agitation at her assisted-living.  Been threatening other people.  Families states the facility said they could not take care of her.  They recommended possible placement in a geriatric psych facility. Past Medical History  Diagnosis Date  . Diabetes mellitus   . Hypertension   . Renal disorder   . Cancer 2000    sp bilateral masectomy, sp chemo  . DVT (deep venous thrombosis)   . PE (pulmonary embolism)    Past Surgical History  Procedure Laterality Date  . Breast surgery    . Mastecomy     Family History  Problem Relation Age of Onset  . Hypertension Mother   . Diabetes Mellitus II Father    Social History  Substance Use Topics  . Smoking status: Never Smoker   . Smokeless tobacco: Not on file  . Alcohol Use: No   OB History    No data available     Review of Systems  Unable to perform ROS: Dementia      Allergies  Cortisone; Oxycodone; and Penicillins  Home Medications   Prior to Admission medications   Medication Sig Start Date End Date Taking? Authorizing Provider  calcitRIOL (ROCALTROL) 0.25 MCG capsule Take 1 capsule (0.25 mcg total) by mouth daily. 04/14/15  Yes Courteney Lyn Mackuen, MD  gabapentin (NEURONTIN) 300 MG capsule Take 1 capsule (300 mg total) by mouth at bedtime. 04/14/15  Yes Courteney Lyn Mackuen, MD  hydrochlorothiazide (HYDRODIURIL) 25 MG tablet Take 1 tablet (25 mg total) by mouth daily. 04/14/15  Yes Courteney Lyn Mackuen, MD  insulin lispro protamine-lispro (HUMALOG 75/25 MIX) (75-25) 100 UNIT/ML SUSP injection Inject 12-14 Units into the skin 2 (two) times daily. Uses 14 units in the AM and 12 units at supper 04/14/15  Yes Courteney Lyn Mackuen, MD  LORazepam (ATIVAN) 1 MG tablet Take 1 tablet (1 mg  total) by mouth every 8 (eight) hours as needed for anxiety. 04/14/15  Yes Courteney Lyn Mackuen, MD  memantine (NAMENDA) 10 MG tablet Take 1 tablet (10 mg total) by mouth 2 (two) times daily. 04/14/15  Yes Courteney Lyn Mackuen, MD  Multiple Vitamin (MULTIVITAMIN) tablet Take 1 tablet by mouth daily. 04/14/15  Yes Courteney Lyn Mackuen, MD  rosuvastatin (CRESTOR) 20 MG tablet Take 1 tablet (20 mg total) by mouth daily. 04/14/15  Yes Courteney Lyn Mackuen, MD  sertraline (ZOLOFT) 100 MG tablet Take 1 tablet (100 mg total) by mouth daily. 04/14/15  Yes Courteney Lyn Mackuen, MD  valsartan (DIOVAN) 160 MG tablet Take 1 tablet (160 mg total) by mouth daily. 04/14/15  Yes Courteney Lyn Mackuen, MD  vitamin B-12 (CYANOCOBALAMIN) 1000 MCG tablet Take 1 tablet (1,000 mcg total) by mouth daily. 04/14/15  Yes Courteney Lyn Mackuen, MD  QUEtiapine (SEROQUEL) 25 MG tablet Take 25 mg by mouth daily as needed (depression).  04/17/15   Historical Provider, MD   BP 142/65 mmHg  Pulse 72  Temp(Src) 98 F (36.7 C) (Oral)  Resp 18  SpO2 100% Physical Exam  Constitutional: She appears well-developed and well-nourished. No distress.  HENT:  Head: Normocephalic and atraumatic.  Eyes: Pupils are equal, round, and reactive to light.  Neck: Normal range of motion.  Cardiovascular: Normal rate  and intact distal pulses.   Pulmonary/Chest: No respiratory distress.  Abdominal: Normal appearance. She exhibits no distension.  Musculoskeletal: Normal range of motion.  Neurological: She is alert. No cranial nerve deficit.  Skin: Skin is warm and dry. No rash noted.  Psychiatric: She has a normal mood and affect. Her speech is normal and behavior is normal. Cognition and memory are impaired.  Nursing note and vitals reviewed.   ED Course  Procedures (including critical care time) Labs Review Labs Reviewed  I-STAT CHEM 8, ED - Abnormal; Notable for the following:    BUN 38 (*)    Creatinine, Ser 1.50 (*)    Glucose, Bld  142 (*)    Hemoglobin 11.2 (*)    HCT 33.0 (*)    All other components within normal limits  CBG MONITORING, ED - Abnormal; Notable for the following:    Glucose-Capillary 120 (*)    All other components within normal limits  CBG MONITORING, ED - Abnormal; Notable for the following:    Glucose-Capillary 109 (*)    All other components within normal limits    Imaging Review No results found. I have personally reviewed and evaluated these images and lab results as part of my medical decision-making.    MDM   Final diagnoses:  Alzheimer's dementia        Leonard Schwartz, MD 04/23/15 [phone]

## 2015-04-20 NOTE — ED Notes (Addendum)
Per family, daughter Josephine Igo, pt was aggressive and barricading herself inside other patients rooms at her assisted living facility. She was also reported to be aggressive towards staff members and refusing medications.
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
## 2015-04-20 NOTE — ED Notes (Signed)
Pt currently staying at [facility]

## 2015-04-20 NOTE — ED Notes (Signed)
Patient is resting comfortably. 

## 2015-04-20 NOTE — ED Notes (Signed)
TTS consultant at bedside
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
## 2015-04-21 DIAGNOSIS — G309 Alzheimer's disease, unspecified: Secondary | ICD-10-CM | POA: Diagnosis not present

## 2015-04-21 LAB — CBG MONITORING, ED
Glucose-Capillary: 120 mg/dL — ABNORMAL HIGH (ref 65–99)
Glucose-Capillary: 109 mg/dL — ABNORMAL HIGH (ref 65–99)

## 2015-04-21 MED ORDER — STERILE WATER FOR INJECTION IJ SOLN
INTRAMUSCULAR | Status: AC
  Administered 2015-04-21: 10 mL
  Filled 2015-04-21: qty 10

## 2015-04-21 MED ORDER — CALCITRIOL 0.25 MCG PO CAPS
0.2500 ug | ORAL_CAPSULE | Freq: Every day | ORAL | Status: DC
  Administered 2015-04-21: 0.25 ug via ORAL
  Filled 2015-04-21: qty 1

## 2015-04-21 MED ORDER — HALOPERIDOL LACTATE 5 MG/ML IJ SOLN
3.0000 mg | Freq: Once | INTRAMUSCULAR | Status: AC
  Administered 2015-04-21: 3 mg via INTRAMUSCULAR
  Filled 2015-04-21: qty 1

## 2015-04-21 MED ORDER — IRBESARTAN 150 MG PO TABS
150.0000 mg | ORAL_TABLET | Freq: Every day | ORAL | Status: DC
  Administered 2015-04-21: 150 mg via ORAL
  Filled 2015-04-21: qty 1

## 2015-04-21 MED ORDER — LORAZEPAM 2 MG/ML IJ SOLN
INTRAMUSCULAR | Status: AC
Start: 2015-04-21 — End: 2015-04-21
  Administered 2015-04-21: 12:00:00
  Filled 2015-04-21: qty 1

## 2015-04-21 MED ORDER — ZIPRASIDONE MESYLATE 20 MG IM SOLR
10.0000 mg | Freq: Once | INTRAMUSCULAR | Status: AC
  Administered 2015-04-21: 10 mg via INTRAMUSCULAR
  Filled 2015-04-21: qty 20

## 2015-04-21 MED ORDER — MEMANTINE HCL 10 MG PO TABS
10.0000 mg | ORAL_TABLET | Freq: Two times a day (BID) | ORAL | Status: DC
  Administered 2015-04-21: 10 mg via ORAL
  Filled 2015-04-21 (×2): qty 1

## 2015-04-21 MED ORDER — SERTRALINE HCL 50 MG PO TABS
100.0000 mg | ORAL_TABLET | Freq: Every day | ORAL | Status: DC
  Administered 2015-04-21: 100 mg via ORAL
  Filled 2015-04-21: qty 2

## 2015-04-21 MED ORDER — GABAPENTIN 300 MG PO CAPS: 300.0000 mg | ORAL_CAPSULE | Freq: Every day | ORAL | Status: DC

## 2015-04-21 MED ORDER — ROSUVASTATIN CALCIUM 20 MG PO TABS
20.0000 mg | ORAL_TABLET | Freq: Every day | ORAL | Status: DC
  Administered 2015-04-21: 20 mg via ORAL
  Filled 2015-04-21: qty 1

## 2015-04-21 MED ORDER — HYDROCHLOROTHIAZIDE 25 MG PO TABS
25.0000 mg | ORAL_TABLET | Freq: Every day | ORAL | Status: DC
  Administered 2015-04-21: 25 mg via ORAL
  Filled 2015-04-21: qty 1

## 2015-04-21 MED ORDER — INSULIN ASPART 100 UNIT/ML ~~LOC~~ SOLN: 0.0000 [IU] | Freq: Three times a day (TID) | SUBCUTANEOUS | Status: DC

## 2015-04-21 NOTE — ED Notes (Signed)
Pt resting quietly at this time

## 2015-04-21 NOTE — BH Assessment (Signed)
Seeking inpt gero placement. Sent referrals to [facility], [place], [place], [place], [place] and [place], [place] Triage Specialist 04/21/2015 1:45 AM

## 2015-04-21 NOTE — ED Notes (Signed)
After all meds were scanned pt refused to take any of morning medications.

## 2015-04-21 NOTE — ED Notes (Addendum)
Pt being verbally aggressive with staff.  Pt redirected to please keep voice down as there are other patients in the department.  Pt refused, continued to use foul language, calling security, nursing staff names.  Different staff members are attempting to interact with patient to no avail.

## 2015-04-21 NOTE — ED Notes (Signed)
Adelina Mings, pt's daughter, [phone]

## 2015-04-21 NOTE — ED Notes (Signed)
Insulin not given for breakfast.  Pt's CBG was 120 and did not want to have breakfast.

## 2015-04-21 NOTE — ED Notes (Signed)
Called pt's husband and let him know pt will be going to [facility] after 6pm today.

## 2015-04-21 NOTE — BHH Counselor (Signed)
Referral packet sent to [facility] Pacini, [facility], [facility], and [place] [facility] [facility] on 04/21/15 at 1:50 a.m.  Shean K. Harris, MS, [place], LCAS-A  Counselor 04/21/2015 2:12 AM ]

## 2015-04-21 NOTE — Progress Notes (Signed)
Shirlee Limerick at [facility] states pt is accepted pending receiving EKG & CBC results and IVC copies.   Sharren Bridge, MSW, LCSW Clinical Social Work, Disposition  04/21/2015 [phone]
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
## 2015-04-21 NOTE — ED Notes (Signed)
Report given to Zella Richer, RN at [facility].

## 2015-04-21 NOTE — ED Notes (Signed)
Tried to get vital signs.  Pt refused.

## 2015-04-21 NOTE — BH Assessment (Signed)
[place] Assessment Progress Note  At 16:00 Shirlee Limerick at [facility] reports that pt has been accepted to their facility by Dr Lowella Grip with a caveat that pt is not to be transported until after 18:00.  Please call report to [phone].  Charmaine Downs, NP concurs with this decision.  Pt is under involuntary commitment and is not to be transported until after 18:00.  Pt's nurse has been notified.  Jalene Mullet, [place] Triage Specialist [phone]

## 2015-04-21 NOTE — ED Notes (Addendum)
Pt being non compliant with redirection, conveying threats to staff.  Dr. Rex Kras is in the department to see another patient, interacted with patient who called her a "stupid bitch", and "to go the hell back to where ever you come from."  Dr little states she would put in an order for IM Haldol

## 2015-04-21 NOTE — Progress Notes (Signed)
CSW familiar with pt from previous ed visits and assistance with placement to [facility]. Pt recommended for inpatient treatment at [facility] psych placement. Pt tentatively accepted to [facility] for inpatient treatment, awaiting bed. CSW updated pt daughter.   [name], [place] Work  [employer] [phone]

## 2015-04-21 NOTE — Progress Notes (Signed)
[facility] requested ekg and cbc. CSW spoke with EDP.   [name], [place] Work  [employer] [phone]

## 2015-05-18 ENCOUNTER — Ambulatory Visit: Payer: Medicare Other | Admitting: Neurology

## 2015-07-19 ENCOUNTER — Encounter (HOSPITAL_COMMUNITY): Payer: Self-pay | Admitting: Emergency Medicine

## 2015-07-19 ENCOUNTER — Emergency Department (HOSPITAL_COMMUNITY): Payer: Medicare Other

## 2015-07-19 ENCOUNTER — Emergency Department (HOSPITAL_COMMUNITY)
Admission: EM | Admit: 2015-07-19 | Discharge: 2015-07-19 | Disposition: A | Discharge status: Home / Self Care | Payer: Medicare Other | Attending: Emergency Medicine | Admitting: Emergency Medicine

## 2015-07-19 DIAGNOSIS — Y92129 Unspecified place in nursing home as the place of occurrence of the external cause: Secondary | ICD-10-CM | POA: Insufficient documentation

## 2015-07-19 DIAGNOSIS — E119 Type 2 diabetes mellitus without complications: Secondary | ICD-10-CM | POA: Insufficient documentation

## 2015-07-19 DIAGNOSIS — Z794 Long term (current) use of insulin: Secondary | ICD-10-CM | POA: Insufficient documentation

## 2015-07-19 DIAGNOSIS — Y998 Other external cause status: Secondary | ICD-10-CM | POA: Insufficient documentation

## 2015-07-19 DIAGNOSIS — Z853 Personal history of malignant neoplasm of breast: Secondary | ICD-10-CM | POA: Diagnosis not present

## 2015-07-19 DIAGNOSIS — F0390 Unspecified dementia without behavioral disturbance: Secondary | ICD-10-CM | POA: Diagnosis not present

## 2015-07-19 DIAGNOSIS — Z880 Allergy status to penicillin: Secondary | ICD-10-CM | POA: Insufficient documentation

## 2015-07-19 DIAGNOSIS — Z79899 Other long term (current) drug therapy: Secondary | ICD-10-CM | POA: Diagnosis not present

## 2015-07-19 DIAGNOSIS — S0003XA Contusion of scalp, initial encounter: Secondary | ICD-10-CM | POA: Insufficient documentation

## 2015-07-19 DIAGNOSIS — Y9389 Activity, other specified: Secondary | ICD-10-CM | POA: Insufficient documentation

## 2015-07-19 DIAGNOSIS — I10 Essential (primary) hypertension: Secondary | ICD-10-CM | POA: Diagnosis not present

## 2015-07-19 DIAGNOSIS — Z86711 Personal history of pulmonary embolism: Secondary | ICD-10-CM | POA: Diagnosis not present

## 2015-07-19 DIAGNOSIS — Z86718 Personal history of other venous thrombosis and embolism: Secondary | ICD-10-CM | POA: Diagnosis not present

## 2015-07-19 DIAGNOSIS — Z87448 Personal history of other diseases of urinary system: Secondary | ICD-10-CM | POA: Diagnosis not present

## 2015-07-19 DIAGNOSIS — S0990XA Unspecified injury of head, initial encounter: Secondary | ICD-10-CM | POA: Diagnosis present

## 2015-07-19 DIAGNOSIS — W1839XA Other fall on same level, initial encounter: Secondary | ICD-10-CM | POA: Insufficient documentation

## 2015-07-19 DIAGNOSIS — W19XXXA Unspecified fall, initial encounter: Secondary | ICD-10-CM

## 2015-07-19 NOTE — ED Notes (Signed)
Bed: [facility] Expected date:  Expected time:  Means of arrival:  Comments: EMS 76M Seizure-post ictal

## 2015-07-19 NOTE — ED Notes (Signed)
Patient transported to CT 

## 2015-07-19 NOTE — ED Notes (Signed)
PTAR notified for Pt transport 

## 2015-07-19 NOTE — ED Notes (Signed)
Bed: [id number] Expected date:  Expected time:  Means of arrival:  Comments: Ems- fall, head lac

## 2015-07-19 NOTE — Discharge Instructions (Signed)

## 2015-07-19 NOTE — ED Notes (Signed)
MD at bedside. 

## 2015-07-19 NOTE — ED Notes (Signed)
Pt from [facility].  Unobserved fall.  Denies LOC.  Hx of Dementia.  C/O pain to back left of head (hematoma present)  Neck towel in place.  No bleeding.  BP 150/78 P80 O2 98%

## 2015-07-19 NOTE — ED Provider Notes (Addendum)
CSN: [id number]     Arrival date & time 07/19/15  1741 History   First MD Initiated Contact with Patient 07/19/15 1746     Chief Complaint  Patient presents with  . Fall     (Consider location/radiation/quality/duration/timing/severity/associated sxs/prior Treatment) HPI Comments: Patient is an 81 year old nursing home patient with a history of diabetes, hypertension, dementia, cancer, PE and DVT who presents today after an unwitnessed fall. Patient was found on the ground by staff at her normal baseline. Patient is unable to say how she fell but denies any loss of consciousness. He takes no anticoagulation at this time. She denies any neck pain or difficulty moving her arms or legs.  The patient has had multiple falls this year and is supposed to use a cane or a walker and unsure if this was being used today  Patient is a 81 y.o. female presenting with fall. The history is provided by the patient, the EMS personnel and the nursing home. The history is limited by the absence of a caregiver.  Fall This is a new problem. The current episode started 1 to 2 hours ago. The problem occurs constantly. The problem has not changed since onset.Associated symptoms comments: Pain in the head. Nothing aggravates the symptoms. Nothing relieves the symptoms. She has tried nothing for the symptoms.    Past Medical History  Diagnosis Date  . Diabetes mellitus   . Hypertension   . Renal disorder   . Cancer ([place]) 2000    sp bilateral masectomy, sp chemo  . DVT (deep venous thrombosis) ([place])   . PE (pulmonary embolism)    Past Surgical History  Procedure Laterality Date  . Breast surgery    . Mastecomy     Family History  Problem Relation Age of Onset  . Hypertension Mother   . Diabetes Mellitus II Father    Social History  Substance Use Topics  . Smoking status: Never Smoker   . Smokeless tobacco: None  . Alcohol Use: No   OB History    No data available     Review of Systems  Unable to  perform ROS: Dementia      Allergies  Cortisone; Oxycodone; and Penicillins  Home Medications   Prior to Admission medications   Medication Sig Start Date End Date Taking? Authorizing Provider  calcitRIOL (ROCALTROL) 0.25 MCG capsule Take 1 capsule (0.25 mcg total) by mouth daily. 04/14/15   Courteney Lyn Mackuen, MD  gabapentin (NEURONTIN) 300 MG capsule Take 1 capsule (300 mg total) by mouth at bedtime. 04/14/15   Courteney Lyn Mackuen, MD  hydrochlorothiazide (HYDRODIURIL) 25 MG tablet Take 1 tablet (25 mg total) by mouth daily. 04/14/15   Courteney Lyn Mackuen, MD  insulin lispro protamine-lispro (HUMALOG 75/25 MIX) (75-25) 100 UNIT/ML SUSP injection Inject 12-14 Units into the skin 2 (two) times daily. Uses 14 units in the AM and 12 units at supper 04/14/15   Courteney Lyn Mackuen, MD  LORazepam (ATIVAN) 1 MG tablet Take 1 tablet (1 mg total) by mouth every 8 (eight) hours as needed for anxiety. 04/14/15   Courteney Lyn Mackuen, MD  memantine (NAMENDA) 10 MG tablet Take 1 tablet (10 mg total) by mouth 2 (two) times daily. 04/14/15   Courteney Lyn Mackuen, MD  Multiple Vitamin (MULTIVITAMIN) tablet Take 1 tablet by mouth daily. 04/14/15   Courteney Lyn Mackuen, MD  QUEtiapine (SEROQUEL) 25 MG tablet Take 25 mg by mouth daily as needed (depression).  04/17/15   Historical Provider, MD  rosuvastatin (CRESTOR) 20 MG tablet Take 1 tablet (20 mg total) by mouth daily. 04/14/15   Courteney Lyn Mackuen, MD  sertraline (ZOLOFT) 100 MG tablet Take 1 tablet (100 mg total) by mouth daily. 04/14/15   Courteney Lyn Mackuen, MD  valsartan (DIOVAN) 160 MG tablet Take 1 tablet (160 mg total) by mouth daily. 04/14/15   Courteney Lyn Mackuen, MD  vitamin B-12 (CYANOCOBALAMIN) 1000 MCG tablet Take 1 tablet (1,000 mcg total) by mouth daily. 04/14/15   Courteney Lyn Mackuen, MD   BP 157/62 mmHg  Pulse 82  Temp(Src) 97.7 F (36.5 C) (Oral)  Resp 18  SpO2 100% Physical Exam  Constitutional: She appears  well-developed and well-nourished. No distress.  HENT:  Head: Normocephalic and atraumatic.    Painful hematoma to the occiput  Eyes: EOM are normal. Pupils are equal, round, and reactive to light.  Neck: No spinous process tenderness and no muscular tenderness present.  Cardiovascular: Normal rate, regular rhythm, normal heart sounds and intact distal pulses.  Exam reveals no friction rub.   No murmur heard. Pulmonary/Chest: Effort normal and breath sounds normal. She has no wheezes. She has no rales.  Abdominal: Soft. Bowel sounds are normal. She exhibits no distension. There is no tenderness. There is no rebound and no guarding.  Musculoskeletal: Normal range of motion. She exhibits no tenderness.  No edema  Neurological: She is alert. No cranial nerve deficit.  Oriented to person and place.  5 out of 5 strength in the upper and lower extremities with normal sensation  Skin: Skin is warm and dry. No rash noted.  Psychiatric: She has a normal mood and affect. Her behavior is normal.  Nursing note and vitals reviewed.   ED Course  Procedures (including critical care time) Labs Review Labs Reviewed - No data to display  Imaging Review Ct Head Wo Contrast  07/19/2015  CLINICAL DATA:  Unobserved fall. Denies LOC. Hx of Dementia. C/O pain to back left of head (hematoma present) Neck towel in place. No bleeding. EXAM: CT HEAD WITHOUT CONTRAST CT CERVICAL SPINE WITHOUT CONTRAST TECHNIQUE: Multidetector CT imaging of the head and cervical spine was performed following the standard protocol without intravenous contrast. Multiplanar CT image reconstructions of the cervical spine were also generated. COMPARISON:  Head CT 04/16/2015. FINDINGS: CT HEAD FINDINGS There is no evidence of acute intracranial hemorrhage, mass lesion, brain edema or extra-axial fluid collection. The ventricles and subarachnoid spaces are mildly prominent but stable. There is no CT evidence of acute cortical infarction.  There is stable chronic periventricular white matter disease. Intracranial vascular calcifications are noted. The visualized paranasal sinuses, mastoid air cells and middle ears are clear. The calvarium is intact. There is moderate diffuse calvarial hyperostosis. CT CERVICAL SPINE FINDINGS The cervical spine demonstrates straightening, but no focal angulation or listhesis. There is no evidence of acute fracture or subluxation. There are prominent anterior bridging osteophytes from C4 through C7. These are discontinuous at C6-7, although this appears nonacute. The predental space is narrowed and there are degenerative changes anteriorly at C1-2. No acute soft tissue findings seen. IMPRESSION: 1. Stable head CT demonstrating chronic atrophy and chronic periventricular white matter disease. No acute intracranial findings. 2. No evidence of acute cervical spine fracture, traumatic subluxation or static signs of instability. Changes of diffuse idiopathic skeletal hyperostosis. Electronically Signed   By: Richardean Sale M.D.   On: 07/19/2015 19:28   Ct Cervical Spine Wo Contrast  07/19/2015  CLINICAL DATA:  Unobserved fall. Denies LOC. Hx  of Dementia. C/O pain to back left of head (hematoma present) Neck towel in place. No bleeding. EXAM: CT HEAD WITHOUT CONTRAST CT CERVICAL SPINE WITHOUT CONTRAST TECHNIQUE: Multidetector CT imaging of the head and cervical spine was performed following the standard protocol without intravenous contrast. Multiplanar CT image reconstructions of the cervical spine were also generated. COMPARISON:  Head CT 04/16/2015. FINDINGS: CT HEAD FINDINGS There is no evidence of acute intracranial hemorrhage, mass lesion, brain edema or extra-axial fluid collection. The ventricles and subarachnoid spaces are mildly prominent but stable. There is no CT evidence of acute cortical infarction. There is stable chronic periventricular white matter disease. Intracranial vascular calcifications are noted.  The visualized paranasal sinuses, mastoid air cells and middle ears are clear. The calvarium is intact. There is moderate diffuse calvarial hyperostosis. CT CERVICAL SPINE FINDINGS The cervical spine demonstrates straightening, but no focal angulation or listhesis. There is no evidence of acute fracture or subluxation. There are prominent anterior bridging osteophytes from C4 through C7. These are discontinuous at C6-7, although this appears nonacute. The predental space is narrowed and there are degenerative changes anteriorly at C1-2. No acute soft tissue findings seen. IMPRESSION: 1. Stable head CT demonstrating chronic atrophy and chronic periventricular white matter disease. No acute intracranial findings. 2. No evidence of acute cervical spine fracture, traumatic subluxation or static signs of instability. Changes of diffuse idiopathic skeletal hyperostosis. Electronically Signed   By: Richardean Sale M.D.   On: 07/19/2015 19:28   I have personally reviewed and evaluated these images and lab results as part of my medical decision-making.   EKG Interpretation None      MDM   Final diagnoses:  Fall, initial encounter  Scalp hematoma, initial encounter    Patient with a history of a fall today. It was unwitnessed at a nursing facility and because patient is demented she is unable to recall what happened however patient was found to be awake and alert when staff found her and has been at her baseline since she has been here.   Patient is alert and oriented to self and location. Her only complaint is of head pain. She has hematoma present on the back of her head. She is unable to describe happened but denied any loss of consciousness. Patient is able to move arms and legs without difficulty were concern for other injury. She is not currently taking any anticoagulation. CT of the head and neck pending  7:36 PM Imaging is neg and pt d/ced back home.  Blanchie Dessert, MD 07/19/15 1936  Blanchie Dessert, MD 07/19/15 1949

## 2015-07-21 ENCOUNTER — Encounter (HOSPITAL_COMMUNITY): Payer: Self-pay

## 2015-07-21 ENCOUNTER — Emergency Department (HOSPITAL_COMMUNITY)
Admission: EM | Admit: 2015-07-21 | Discharge: 2015-07-22 | Disposition: A | Discharge status: Home / Self Care | Payer: Medicare Other | Attending: Emergency Medicine | Admitting: Emergency Medicine

## 2015-07-21 ENCOUNTER — Emergency Department (HOSPITAL_COMMUNITY): Payer: Medicare Other

## 2015-07-21 DIAGNOSIS — Z86718 Personal history of other venous thrombosis and embolism: Secondary | ICD-10-CM | POA: Insufficient documentation

## 2015-07-21 DIAGNOSIS — E876 Hypokalemia: Secondary | ICD-10-CM | POA: Diagnosis not present

## 2015-07-21 DIAGNOSIS — Z880 Allergy status to penicillin: Secondary | ICD-10-CM | POA: Diagnosis not present

## 2015-07-21 DIAGNOSIS — Y998 Other external cause status: Secondary | ICD-10-CM | POA: Diagnosis not present

## 2015-07-21 DIAGNOSIS — Z86711 Personal history of pulmonary embolism: Secondary | ICD-10-CM | POA: Diagnosis not present

## 2015-07-21 DIAGNOSIS — S0101XA Laceration without foreign body of scalp, initial encounter: Secondary | ICD-10-CM | POA: Diagnosis not present

## 2015-07-21 DIAGNOSIS — Z794 Long term (current) use of insulin: Secondary | ICD-10-CM | POA: Diagnosis not present

## 2015-07-21 DIAGNOSIS — F0281 Dementia in other diseases classified elsewhere with behavioral disturbance: Secondary | ICD-10-CM | POA: Insufficient documentation

## 2015-07-21 DIAGNOSIS — Z79899 Other long term (current) drug therapy: Secondary | ICD-10-CM | POA: Insufficient documentation

## 2015-07-21 DIAGNOSIS — S0990XA Unspecified injury of head, initial encounter: Secondary | ICD-10-CM | POA: Diagnosis present

## 2015-07-21 DIAGNOSIS — E119 Type 2 diabetes mellitus without complications: Secondary | ICD-10-CM | POA: Insufficient documentation

## 2015-07-21 DIAGNOSIS — Z8781 Personal history of (healed) traumatic fracture: Secondary | ICD-10-CM | POA: Diagnosis not present

## 2015-07-21 DIAGNOSIS — Y92129 Unspecified place in nursing home as the place of occurrence of the external cause: Secondary | ICD-10-CM | POA: Insufficient documentation

## 2015-07-21 DIAGNOSIS — K219 Gastro-esophageal reflux disease without esophagitis: Secondary | ICD-10-CM | POA: Insufficient documentation

## 2015-07-21 DIAGNOSIS — E785 Hyperlipidemia, unspecified: Secondary | ICD-10-CM | POA: Insufficient documentation

## 2015-07-21 DIAGNOSIS — N183 Chronic kidney disease, stage 3 (moderate): Secondary | ICD-10-CM | POA: Diagnosis not present

## 2015-07-21 DIAGNOSIS — W19XXXA Unspecified fall, initial encounter: Secondary | ICD-10-CM

## 2015-07-21 DIAGNOSIS — F0391 Unspecified dementia with behavioral disturbance: Secondary | ICD-10-CM

## 2015-07-21 DIAGNOSIS — Y9301 Activity, walking, marching and hiking: Secondary | ICD-10-CM | POA: Insufficient documentation

## 2015-07-21 DIAGNOSIS — Z853 Personal history of malignant neoplasm of breast: Secondary | ICD-10-CM | POA: Insufficient documentation

## 2015-07-21 DIAGNOSIS — W01198A Fall on same level from slipping, tripping and stumbling with subsequent striking against other object, initial encounter: Secondary | ICD-10-CM | POA: Diagnosis not present

## 2015-07-21 DIAGNOSIS — I129 Hypertensive chronic kidney disease with stage 1 through stage 4 chronic kidney disease, or unspecified chronic kidney disease: Secondary | ICD-10-CM | POA: Insufficient documentation

## 2015-07-21 DIAGNOSIS — G309 Alzheimer's disease, unspecified: Secondary | ICD-10-CM | POA: Diagnosis not present

## 2015-07-21 HISTORY — DX: Chronic kidney disease, stage 3 (moderate): N18.3

## 2015-07-21 HISTORY — DX: Alzheimer's disease, unspecified: G30.9

## 2015-07-21 HISTORY — DX: Hypokalemia: E87.6

## 2015-07-21 HISTORY — DX: Hyperlipidemia, unspecified: E78.5

## 2015-07-21 HISTORY — DX: Unspecified dementia, unspecified severity, without behavioral disturbance, psychotic disturbance, mood disturbance, and anxiety: F03.90

## 2015-07-21 HISTORY — DX: Gastro-esophageal reflux disease without esophagitis: K21.9

## 2015-07-21 HISTORY — DX: Dehydration: E86.0

## 2015-07-21 HISTORY — DX: Fracture of ramus of mandible, unspecified side, initial encounter for closed fracture: S02.640A

## 2015-07-21 HISTORY — DX: Dementia in other diseases classified elsewhere, unspecified severity, without behavioral disturbance, psychotic disturbance, mood disturbance, and anxiety: F02.80

## 2015-07-21 HISTORY — DX: Chronic kidney disease, stage 3 unspecified: N18.30

## 2015-07-21 MED ORDER — THIOTHIXENE 2 MG PO CAPS
2.0000 mg | ORAL_CAPSULE | Freq: Once | ORAL | Status: AC
  Administered 2015-07-21: 2 mg via ORAL
  Filled 2015-07-21: qty 1

## 2015-07-21 MED ORDER — MEMANTINE HCL 10 MG PO TABS
10.0000 mg | ORAL_TABLET | Freq: Once | ORAL | Status: AC
  Administered 2015-07-21: 10 mg via ORAL
  Filled 2015-07-21: qty 1

## 2015-07-21 MED ORDER — VENLAFAXINE HCL 50 MG PO TABS
50.0000 mg | ORAL_TABLET | Freq: Once | ORAL | Status: AC
  Administered 2015-07-21: 50 mg via ORAL
  Filled 2015-07-21: qty 1

## 2015-07-21 MED ORDER — DIVALPROEX SODIUM 125 MG PO CSDR
250.0000 mg | DELAYED_RELEASE_CAPSULE | Freq: Once | ORAL | Status: AC
  Administered 2015-07-21: 250 mg via ORAL
  Filled 2015-07-21: qty 2

## 2015-07-21 MED ORDER — HALOPERIDOL LACTATE 5 MG/ML IJ SOLN
2.5000 mg | Freq: Once | INTRAMUSCULAR | Status: AC
  Administered 2015-07-21: 2.5 mg via INTRAMUSCULAR
  Filled 2015-07-21: qty 1

## 2015-07-21 MED ORDER — GABAPENTIN 300 MG PO CAPS
300.0000 mg | ORAL_CAPSULE | Freq: Once | ORAL | Status: AC
  Administered 2015-07-22: 300 mg via ORAL
  Filled 2015-07-21: qty 1

## 2015-07-21 NOTE — ED Notes (Signed)
Pt moved back out into hall. Was given washcloths to fold. Pt is calm at this time. Family at bedside.
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
## 2015-07-21 NOTE — ED Notes (Signed)
Patient ambulated using walker and two staff members to the restroom and back to stretcher. Pt urinated in the toilet. Changed diaper. Gowned patient. Provided a warm blanket with returning from restroom. Pt tolerated it well.

## 2015-07-21 NOTE — ED Notes (Signed)
MD at bedside. EDP LITTLE

## 2015-07-21 NOTE — ED Notes (Signed)
Patients spouse came up to nurses station requesting an update, he was reporting provider had not been to bedside in multiple hours. When in fact Dr. Rex Kras has been to bedside multiple times and updated patient, spouse, daughter, and son. Son and daughter have left bedside after last update. Informed spouse once again of the plan to wait for a TTS consult to see about further placement in [place] at a rehab facility. If not, Education officer, museum will have to evaluate situation in the morning.

## 2015-07-21 NOTE — ED Notes (Signed)
Per [facility]- Pt resides at [facility]. Full Code. Pt fell 2 days ago. Seen and treated here and also fell today. Witnessed fall. NO LOC. Pt walking with walker and fell backwards straight back. Contusion to back of head. HX of Dementia. Per staff pt is at baseline.

## 2015-07-21 NOTE — ED Notes (Addendum)
Deferred vital signs due to pt behavior. Pt is calm at this time and taking vitals would cause unrest. Pt respiratory rate 18, is resting and is in no distress at this time.

## 2015-07-21 NOTE — ED Notes (Signed)
Bed: [facility] Expected date:  Expected time:  Means of arrival:  Comments: EMS- 81yo F, fall/hit head/no thinners

## 2015-07-22 DIAGNOSIS — S0101XA Laceration without foreign body of scalp, initial encounter: Secondary | ICD-10-CM | POA: Diagnosis not present

## 2015-07-22 NOTE — ED Notes (Signed)
Patient's spouse states that staff is not watching patient. Pt is in an hallway bed where she is visualized by staff members at all times. Pt has been repositioned in bed multiple times and ambulated to the restroom.Everytime spouse has came to nurses station, staff has addressed his concerns.  Pt will lay down on stretcher resting quietly, spouse will come moving patients covers or standing over her.

## 2015-07-22 NOTE — ED Notes (Signed)
Social worker at bedside with husband. Informed that patient will be returning to [facility]. Husband concerned about level of care and concerned with falls.

## 2015-07-22 NOTE — ED Notes (Signed)
Patient trying to get out of bed. Bed alarm placed.

## 2015-07-22 NOTE — ED Notes (Signed)
[facility] called. Report called to [facility].

## 2015-07-22 NOTE — ED Notes (Signed)
[facility] here for transport. 

## 2015-07-22 NOTE — Progress Notes (Addendum)
CSW spoke with [employer], who is the Transport planner at [facility]. Also, CSW spoke with Anderson Malta who confirms that the patient lives in the Memory Care unit of the facility. CSW informed Care Manager that the patient has been cleared for discharge and will be returning back to the facility. Care Manager expressed understanding and is aware that patient will be returning soon. Also, she states that the facility cannot provide transportation. CSW informed her that [facility] will make arrangements with [facility].  CSW made nurse aware, who will arrange transportation. CSW spoke with husband who is aware that the patient will be transported back to facility. Spouse states that he feels staff is not appropriately watching the patient. CSW will provide spouse with Ombudsman and a list of ALF.  Willette Brace [id number] ED CSW 07/22/2015 9:47 AM

## 2015-07-22 NOTE — ED Notes (Signed)
Patient ambulated with walker and one staff member to the restroom and back to stretcher. Pt tolerated it well. Pt offered water but declined.
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
## 2015-07-22 NOTE — ED Notes (Signed)
TTS counselor at bedside, speaking with patient's spouse.

## 2015-07-22 NOTE — ED Notes (Signed)
Pt trying to get out of bed.  Pt re-directed.  And covers replaced over her body and pt appears to be resting now.

## 2015-07-22 NOTE — ED Notes (Signed)
Patient trying to get out of bed. Alarm going off. Patient placed at nurses station.

## 2015-07-24 ENCOUNTER — Emergency Department (HOSPITAL_COMMUNITY)
Admission: EM | Admit: 2015-07-24 | Discharge: 2015-07-24 | Disposition: A | Discharge status: Skilled Nursing Facility | Payer: Medicare Other | Attending: Emergency Medicine | Admitting: Emergency Medicine

## 2015-07-24 ENCOUNTER — Emergency Department (HOSPITAL_COMMUNITY): Payer: Medicare Other

## 2015-07-24 ENCOUNTER — Encounter (HOSPITAL_COMMUNITY): Payer: Self-pay | Admitting: *Deleted

## 2015-07-24 DIAGNOSIS — W19XXXA Unspecified fall, initial encounter: Secondary | ICD-10-CM | POA: Insufficient documentation

## 2015-07-24 DIAGNOSIS — N183 Chronic kidney disease, stage 3 (moderate): Secondary | ICD-10-CM | POA: Insufficient documentation

## 2015-07-24 DIAGNOSIS — Z794 Long term (current) use of insulin: Secondary | ICD-10-CM | POA: Insufficient documentation

## 2015-07-24 DIAGNOSIS — Y92129 Unspecified place in nursing home as the place of occurrence of the external cause: Secondary | ICD-10-CM | POA: Insufficient documentation

## 2015-07-24 DIAGNOSIS — I129 Hypertensive chronic kidney disease with stage 1 through stage 4 chronic kidney disease, or unspecified chronic kidney disease: Secondary | ICD-10-CM | POA: Diagnosis not present

## 2015-07-24 DIAGNOSIS — Z79899 Other long term (current) drug therapy: Secondary | ICD-10-CM | POA: Diagnosis not present

## 2015-07-24 DIAGNOSIS — E785 Hyperlipidemia, unspecified: Secondary | ICD-10-CM | POA: Insufficient documentation

## 2015-07-24 DIAGNOSIS — Y9389 Activity, other specified: Secondary | ICD-10-CM | POA: Insufficient documentation

## 2015-07-24 DIAGNOSIS — Z8781 Personal history of (healed) traumatic fracture: Secondary | ICD-10-CM | POA: Insufficient documentation

## 2015-07-24 DIAGNOSIS — Z86711 Personal history of pulmonary embolism: Secondary | ICD-10-CM | POA: Insufficient documentation

## 2015-07-24 DIAGNOSIS — Z880 Allergy status to penicillin: Secondary | ICD-10-CM | POA: Insufficient documentation

## 2015-07-24 DIAGNOSIS — E119 Type 2 diabetes mellitus without complications: Secondary | ICD-10-CM | POA: Insufficient documentation

## 2015-07-24 DIAGNOSIS — Z8589 Personal history of malignant neoplasm of other organs and systems: Secondary | ICD-10-CM | POA: Diagnosis not present

## 2015-07-24 DIAGNOSIS — Z86718 Personal history of other venous thrombosis and embolism: Secondary | ICD-10-CM | POA: Diagnosis not present

## 2015-07-24 DIAGNOSIS — Z791 Long term (current) use of non-steroidal anti-inflammatories (NSAID): Secondary | ICD-10-CM | POA: Insufficient documentation

## 2015-07-24 DIAGNOSIS — Y999 Unspecified external cause status: Secondary | ICD-10-CM | POA: Insufficient documentation

## 2015-07-24 DIAGNOSIS — Z87448 Personal history of other diseases of urinary system: Secondary | ICD-10-CM | POA: Diagnosis not present

## 2015-07-24 DIAGNOSIS — S0003XA Contusion of scalp, initial encounter: Secondary | ICD-10-CM | POA: Diagnosis not present

## 2015-07-24 DIAGNOSIS — F0390 Unspecified dementia without behavioral disturbance: Secondary | ICD-10-CM | POA: Diagnosis not present

## 2015-07-24 DIAGNOSIS — K219 Gastro-esophageal reflux disease without esophagitis: Secondary | ICD-10-CM | POA: Insufficient documentation

## 2015-07-24 DIAGNOSIS — S0990XA Unspecified injury of head, initial encounter: Secondary | ICD-10-CM | POA: Diagnosis present

## 2015-07-24 DIAGNOSIS — Z7901 Long term (current) use of anticoagulants: Secondary | ICD-10-CM | POA: Insufficient documentation

## 2015-07-24 LAB — URINALYSIS, ROUTINE W REFLEX MICROSCOPIC
Bilirubin Urine: NEGATIVE
Glucose, UA: NEGATIVE mg/dL
Ketones, ur: NEGATIVE mg/dL
LEUKOCYTES UA: NEGATIVE
Nitrite: NEGATIVE
PROTEIN: NEGATIVE mg/dL
Specific Gravity, Urine: 1.01 (ref 1.005–1.030)
pH: 6.5 (ref 5.0–8.0)

## 2015-07-24 LAB — CBC WITH DIFFERENTIAL/PLATELET
BASOS PCT: 0 %
Basophils Absolute: 0 10*3/uL (ref 0.0–0.1)
EOS ABS: 0.1 10*3/uL (ref 0.0–0.7)
Eosinophils Relative: 1 %
HCT: 37.6 % (ref 36.0–46.0)
HEMOGLOBIN: 11.8 g/dL — AB (ref 12.0–15.0)
LYMPHS ABS: 1.3 10*3/uL (ref 0.7–4.0)
Lymphocytes Relative: 17 %
MCH: 30.3 pg (ref 26.0–34.0)
MCHC: 31.4 g/dL (ref 30.0–36.0)
MCV: 96.4 fL (ref 78.0–100.0)
Monocytes Absolute: 0.5 10*3/uL (ref 0.1–1.0)
Monocytes Relative: 7 %
NEUTROS ABS: 5.8 10*3/uL (ref 1.7–7.7)
NEUTROS PCT: 75 %
Platelets: 191 10*3/uL (ref 150–400)
RBC: 3.9 MIL/uL (ref 3.87–5.11)
RDW: 13.2 % (ref 11.5–15.5)
WBC: 7.7 10*3/uL (ref 4.0–10.5)

## 2015-07-24 LAB — URINE MICROSCOPIC-ADD ON

## 2015-07-24 LAB — COMPREHENSIVE METABOLIC PANEL
ALBUMIN: 3 g/dL — AB (ref 3.5–5.0)
ALT: 21 U/L (ref 14–54)
AST: 21 U/L (ref 15–41)
Alkaline Phosphatase: 54 U/L (ref 38–126)
Anion gap: 11 (ref 5–15)
BUN: 34 mg/dL — AB (ref 6–20)
CHLORIDE: 106 mmol/L (ref 101–111)
CO2: 25 mmol/L (ref 22–32)
CREATININE: 1.54 mg/dL — AB (ref 0.44–1.00)
Calcium: 9.4 mg/dL (ref 8.9–10.3)
GFR calc Af Amer: 35 mL/min — ABNORMAL LOW (ref >60)
GFR calc non Af Amer: 30 mL/min — ABNORMAL LOW (ref >60)
Glucose, Bld: 162 mg/dL — ABNORMAL HIGH (ref 65–99)
Potassium: 5 mmol/L (ref 3.5–5.1)
SODIUM: 142 mmol/L (ref 135–145)
Total Bilirubin: 0.6 mg/dL (ref 0.3–1.2)
Total Protein: 6.9 g/dL (ref 6.5–8.1)

## 2015-07-24 LAB — VALPROIC ACID LEVEL: VALPROIC ACID LVL: 15 ug/mL — AB (ref 50.0–100.0)

## 2015-07-24 LAB — I-STAT TROPONIN, ED: Troponin i, poc: 0.01 ng/mL (ref 0.00–0.08)

## 2015-07-24 NOTE — ED Notes (Signed)
Spoke to daughter who states pt has been placed in [facility] x 7 days for dementia. This is the third fall in a week with recent staple placement. Daughter also reports pt has combative and aggressive behavior from dementia Was seen at [facility] for medication regulation, however medication regimen has been interrupted due to frequent falls

## 2015-07-24 NOTE — ED Notes (Signed)
Tina, NT, safety sitting with pt until PTAR arrives.

## 2015-07-24 NOTE — ED Notes (Signed)
Kristina [name] ., NT, at bedside as Air cabin crew.

## 2015-07-24 NOTE — ED Notes (Signed)
Pt from [facility] facility for an unwitnessed fall. Laceration and hematoma to posterior head per EMS. Staff reports pt was down for ~64mins before being found. Pt usually ambulatory with walker. Pressure bandage applied by EMS. EMS VS 183/66 HR 92 97%RA CBG 124

## 2015-07-24 NOTE — ED Notes (Signed)
Patient transported to CT 

## 2015-07-24 NOTE — ED Notes (Signed)
PA at bedside.

## 2015-07-24 NOTE — ED Provider Notes (Signed)
CSN: [id number]     Arrival date & time 07/24/15  [id number] History   First MD Initiated Contact with Patient 07/24/15 [phone]     Chief Complaint  Patient presents with  . Fall     (Consider location/radiation/quality/duration/timing/severity/associated sxs/prior Treatment) Patient is a 81 y.o. female presenting with fall. The history is provided by the patient and medical records.  Fall    81 year old female with history of diabetes, hypertension, renal disorder, DVT and PE not currently on anticoagulation, severe dementia, GERD, Alzheimer's disease, presenting to the ED after a fall. Patient is unable to provide any history at this time, therefore history was obtained from daughter and EMS. Patient is a resident at [facility], had an unwitnessed fall this morning. She has a laceration and hematoma to her posterior scalp. Staff reports patient was on the floor for proximally 7 minutes before she was found. Patient is ambulatory with walker at baseline. Per daughter, patient has been at [facility] for a week, however this is her third fall. She had staples placed to her posterior scalp 2 days ago from prior fall.  Tetanus is UTD, given September 2016 in ED.    Past Medical History  Diagnosis Date  . Diabetes mellitus   . Hypertension   . Renal disorder   . Cancer ([place]) 2000    sp bilateral masectomy, sp chemo  . DVT (deep venous thrombosis) ([place])   . PE (pulmonary embolism)   . Dementia   . GERD (gastroesophageal reflux disease)   . Dehydration   . Hypokalemia   . GERD (gastroesophageal reflux disease)   . Fracture of ramus of mandible ([place])   . Hyperlipidemia   . Alzheimer disease   . Chronic kidney disease (CKD), stage III (moderate)    Past Surgical History  Procedure Laterality Date  . Breast surgery    . Mastecomy     Family History  Problem Relation Age of Onset  . Hypertension Mother   . Diabetes Mellitus II Father    Social History  Substance Use Topics  .  Smoking status: Never Smoker   . Smokeless tobacco: None  . Alcohol Use: No   OB History    No data available     Review of Systems  Unable to perform ROS: Dementia      Allergies  Cortisone; Oxycodone; and Penicillins  Home Medications   Prior to Admission medications   Medication Sig Start Date End Date Taking? Authorizing Provider  acetaminophen (TYLENOL) 325 MG tablet Take 325 mg by mouth 3 (three) times daily.    Historical Provider, MD  acetaminophen (TYLENOL) 500 MG tablet Take 500 mg by mouth every 4 (four) hours as needed for fever.    Historical Provider, MD  ALPRAZolam Duanne Moron) 0.25 MG tablet Take 0.25 mg by mouth 2 (two) times daily. 07/16/15 08/15/15  Historical Provider, MD  ALPRAZolam Duanne Moron) 0.5 MG tablet Take 0.5 mg by mouth at bedtime. Take 1 tablet every night at bedtime, and every 4 hours as needed 07/16/15 08/15/15  Historical Provider, MD  ALPRAZolam Duanne Moron) 0.5 MG tablet Take 0.5 mg by mouth 4 (four) times daily as needed for anxiety (control).    Historical Provider, MD  alum & mag hydroxide-simeth (MAALOX/MYLANTA) 200-200-20 MG/5ML suspension Take 30 mLs by mouth every 6 (six) hours as needed for indigestion or heartburn.    Historical Provider, MD  calcitRIOL (ROCALTROL) 0.25 MCG capsule Take 1 capsule (0.25 mcg total) by mouth daily. 04/14/15   Courteney  Lyn Mackuen, MD  candesartan (ATACAND) 4 MG tablet Take 4 mg by mouth daily. 07/16/15 07/15/16  Historical Provider, MD  divalproex (DEPAKOTE SPRINKLE) 125 MG capsule Take 250 mg by mouth at bedtime. 07/16/15   Historical Provider, MD  ferrous sulfate 325 (65 FE) MG tablet Take 325 mg by mouth 2 (two) times daily. 07/16/15 07/15/16  Historical Provider, MD  gabapentin (NEURONTIN) 300 MG capsule Take 1 capsule (300 mg total) by mouth at bedtime. 04/14/15   Courteney Lyn Mackuen, MD  guaifenesin (ROBITUSSIN) 100 MG/5ML syrup Take 200 mg by mouth 4 (four) times daily as needed for cough.    Historical Provider, MD   insulin aspart (NOVOLOG) 100 UNIT/ML injection Inject 2-6 Units into the skin 4 (four) times daily -  before meals and at bedtime. Blood glucose 150-200=2 units, 201-250=4 units, 251-300=6units    Historical Provider, MD  insulin lispro protamine-lispro (HUMALOG 75/25 MIX) (75-25) 100 UNIT/ML SUSP injection Inject 12-14 Units into the skin 2 (two) times daily. Uses 14 units in the AM and 12 units at supper Patient not taking: Reported on 07/21/2015 04/14/15   Courteney Lyn Mackuen, MD  loperamide (IMODIUM) 2 MG capsule Take 2 mg by mouth daily as needed for diarrhea or loose stools.    Historical Provider, MD  magnesium hydroxide (MILK OF MAGNESIA) 400 MG/5ML suspension Take 30 mLs by mouth daily as needed for mild constipation.    Historical Provider, MD  memantine (NAMENDA) 10 MG tablet Take 1 tablet (10 mg total) by mouth 2 (two) times daily. 04/14/15   Courteney Lyn Mackuen, MD  Multiple Vitamin (MULTIVITAMIN) tablet Take 1 tablet by mouth daily. 04/14/15   Courteney Lyn Mackuen, MD  neomycin-bacitracin-polymyxin (NEOSPORIN) [phone] ointment Apply 1 application topically daily as needed (skin tears).    Historical Provider, MD  pantoprazole (PROTONIX) 40 MG tablet Take 40 mg by mouth daily.    Historical Provider, MD  potassium chloride SA (K-DUR,KLOR-CON) 20 MEQ tablet Take 20 mEq by mouth daily. 07/16/15   Historical Provider, MD  rosuvastatin (CRESTOR) 20 MG tablet Take 1 tablet (20 mg total) by mouth daily. 04/14/15   Courteney Lyn Mackuen, MD  thiothixene (NAVANE) 1 MG capsule Take 2 mg by mouth 2 (two) times daily.     Historical Provider, MD  venlafaxine (EFFEXOR) 50 MG tablet Take 50 mg by mouth 3 (three) times daily with meals.    Historical Provider, MD  vitamin B-12 (CYANOCOBALAMIN) 1000 MCG tablet Take 1 tablet (1,000 mcg total) by mouth daily. 04/14/15   Courteney Lyn Mackuen, MD   BP 178/87 mmHg  Pulse 82  Temp(Src) 97.7 F (36.5 C) (Oral)  Resp 16  SpO2 98%   Physical Exam   Constitutional: She appears well-developed and well-nourished.  HENT:  Head: Normocephalic. Head is with contusion.  Mouth/Throat: Oropharynx is clear and moist.  3 staples in place to posterior scalp with associated hematoma; there is an adjacent hematoma of right parietal scalp noted with dried blood surrounding, no active bleeding; no noted lacerations  Eyes: Conjunctivae and EOM are normal. Pupils are equal, round, and reactive to light.  Neck: Normal range of motion.  Cardiovascular: Normal rate, regular rhythm and normal heart sounds.   Pulmonary/Chest: Effort normal and breath sounds normal. No respiratory distress. She has no wheezes.  Abdominal: Soft. Bowel sounds are normal.  Musculoskeletal: Normal range of motion.  No apparent pain with palpation of hips or movement of legs; no leg shortening  Neurological: She is alert.  Responds  to tactile stimuli; moving extremities spontaneously  Skin: Skin is warm and dry.  Psychiatric: She has a normal mood and affect.  Nursing note and vitals reviewed.   ED Course  Procedures (including critical care time) Labs Review Labs Reviewed  CBC WITH DIFFERENTIAL/PLATELET - Abnormal; Notable for the following:    Hemoglobin 11.8 (*)    All other components within normal limits  VALPROIC ACID LEVEL - Abnormal; Notable for the following:    Valproic Acid Lvl 15 (*)    All other components within normal limits  URINALYSIS, ROUTINE W REFLEX MICROSCOPIC (NOT AT [facility]) - Abnormal; Notable for the following:    APPearance CLOUDY (*)    Hgb urine dipstick TRACE (*)    All other components within normal limits  COMPREHENSIVE METABOLIC PANEL - Abnormal; Notable for the following:    Glucose, Bld 162 (*)    BUN 34 (*)    Creatinine, Ser 1.54 (*)    Albumin 3.0 (*)    GFR calc non Af Amer 30 (*)    GFR calc Af Amer 35 (*)    All other components within normal limits  URINE MICROSCOPIC-ADD ON - Abnormal; Notable for the following:    Squamous  Epithelial / LPF 0-5 (*)    Bacteria, UA RARE (*)    All other components within normal limits  URINE CULTURE  I-STAT TROPOININ, ED    Imaging Review Ct Head Wo Contrast  07/24/2015  CLINICAL DATA:  Recent falls with headaches and neck pain EXAM: CT HEAD WITHOUT CONTRAST CT CERVICAL SPINE WITHOUT CONTRAST TECHNIQUE: Multidetector CT imaging of the head and cervical spine was performed following the standard protocol without intravenous contrast. Multiplanar CT image reconstructions of the cervical spine were also generated. COMPARISON:  07/21/2015 FINDINGS: CT HEAD FINDINGS Bony calvarium is intact. Mild atrophic changes and chronic white matter ischemic changes are seen. No findings to suggest acute hemorrhage, acute infarction or space-occupying mass lesion are seen. Mild soft tissue hematoma is noted in the right posterior parietal region CT CERVICAL SPINE FINDINGS Seven cervical segments are well visualized. Large anterior osteophytes are noted from C3-C7. Mild facet hypertrophic changes are noted. No significant soft tissue abnormality is seen. No acute fracture or acute facet abnormality new is noted. IMPRESSION: CT of the head: Atrophic and ischemic changes stable from the prior study. Soft tissue hematoma in the right posterior parietal region CT of the cervical spine: Degenerative changes without acute abnormality. Electronically Signed   By: Inez Catalina M.D.   On: 07/24/2015 08:34   Ct Cervical Spine Wo Contrast  07/24/2015  CLINICAL DATA:  Recent falls with headaches and neck pain EXAM: CT HEAD WITHOUT CONTRAST CT CERVICAL SPINE WITHOUT CONTRAST TECHNIQUE: Multidetector CT imaging of the head and cervical spine was performed following the standard protocol without intravenous contrast. Multiplanar CT image reconstructions of the cervical spine were also generated. COMPARISON:  07/21/2015 FINDINGS: CT HEAD FINDINGS Bony calvarium is intact. Mild atrophic changes and chronic white matter  ischemic changes are seen. No findings to suggest acute hemorrhage, acute infarction or space-occupying mass lesion are seen. Mild soft tissue hematoma is noted in the right posterior parietal region CT CERVICAL SPINE FINDINGS Seven cervical segments are well visualized. Large anterior osteophytes are noted from C3-C7. Mild facet hypertrophic changes are noted. No significant soft tissue abnormality is seen. No acute fracture or acute facet abnormality new is noted. IMPRESSION: CT of the head: Atrophic and ischemic changes stable from the prior study. Soft  tissue hematoma in the right posterior parietal region CT of the cervical spine: Degenerative changes without acute abnormality. Electronically Signed   By: Inez Catalina M.D.   On: 07/24/2015 08:34   I have personally reviewed and evaluated these images and lab results as part of my medical decision-making.   EKG Interpretation None      MDM   Final diagnoses:  Fall, initial encounter  Scalp hematoma, initial encounter   81 year old female here with a fall. Patient has been at [facility] for the past week, this is her third fall in the past week. She had staples placed 2 days ago. Family has concern as to why patient is following. She is usually ambulatory with walker. Patient is severely demented and hx of being combative as well.  On exam she has a hematoma noted to posterior scalp with 3 staples in place. She also has an adjacent right parietal hematoma with dried blood surrounding, no active bleeding or laceration. Given recurrent falls and third ED visit, will obtain labs as well as repeat CT head and cervical spine. Tetanus is up-to-date.  Labwork is overall reassuring. UA without signs of infection. CT scan revealing scalp hematoma, no acute intracranial bleed or neck fracture.  Wounds cleansed and dressed by myself and nursing staff.  Patient will be discharged back to facility.  Follow-up with PCP for staple removal next week as  recommended from prior visit.  Report called to facility regarding ED visit and follow-up recommendations.  Patient transferred back to facility via PTAR.  Larene Pickett, PA-C 07/24/15 [place], MD 07/24/15 [phone]

## 2015-07-24 NOTE — Discharge Instructions (Signed)
Work-up today including labs and head/neck CT scans were normal. Follow-up with primary care physician. Return here for new concerns.

## 2015-07-24 NOTE — ED Notes (Signed)
MD at bedside. 

## 2015-07-25 LAB — URINE CULTURE

## 2015-07-27 ENCOUNTER — Emergency Department (HOSPITAL_COMMUNITY)
Admission: EM | Admit: 2015-07-27 | Discharge: 2015-07-28 | Disposition: A | Discharge status: Home / Self Care | Payer: Medicare Other | Attending: Emergency Medicine | Admitting: Emergency Medicine

## 2015-07-27 ENCOUNTER — Encounter (HOSPITAL_COMMUNITY): Payer: Self-pay

## 2015-07-27 DIAGNOSIS — S60021A Contusion of right index finger without damage to nail, initial encounter: Secondary | ICD-10-CM | POA: Insufficient documentation

## 2015-07-27 DIAGNOSIS — E876 Hypokalemia: Secondary | ICD-10-CM | POA: Insufficient documentation

## 2015-07-27 DIAGNOSIS — W1839XA Other fall on same level, initial encounter: Secondary | ICD-10-CM | POA: Diagnosis not present

## 2015-07-27 DIAGNOSIS — Y998 Other external cause status: Secondary | ICD-10-CM | POA: Insufficient documentation

## 2015-07-27 DIAGNOSIS — G309 Alzheimer's disease, unspecified: Secondary | ICD-10-CM | POA: Diagnosis not present

## 2015-07-27 DIAGNOSIS — Z86711 Personal history of pulmonary embolism: Secondary | ICD-10-CM | POA: Insufficient documentation

## 2015-07-27 DIAGNOSIS — W19XXXA Unspecified fall, initial encounter: Secondary | ICD-10-CM

## 2015-07-27 DIAGNOSIS — Y9289 Other specified places as the place of occurrence of the external cause: Secondary | ICD-10-CM | POA: Insufficient documentation

## 2015-07-27 DIAGNOSIS — Y9389 Activity, other specified: Secondary | ICD-10-CM | POA: Diagnosis not present

## 2015-07-27 DIAGNOSIS — E119 Type 2 diabetes mellitus without complications: Secondary | ICD-10-CM | POA: Diagnosis not present

## 2015-07-27 DIAGNOSIS — Z880 Allergy status to penicillin: Secondary | ICD-10-CM | POA: Insufficient documentation

## 2015-07-27 DIAGNOSIS — S0993XA Unspecified injury of face, initial encounter: Secondary | ICD-10-CM | POA: Insufficient documentation

## 2015-07-27 DIAGNOSIS — K219 Gastro-esophageal reflux disease without esophagitis: Secondary | ICD-10-CM | POA: Diagnosis not present

## 2015-07-27 DIAGNOSIS — Z8781 Personal history of (healed) traumatic fracture: Secondary | ICD-10-CM | POA: Insufficient documentation

## 2015-07-27 DIAGNOSIS — F0280 Dementia in other diseases classified elsewhere without behavioral disturbance: Secondary | ICD-10-CM | POA: Insufficient documentation

## 2015-07-27 DIAGNOSIS — I129 Hypertensive chronic kidney disease with stage 1 through stage 4 chronic kidney disease, or unspecified chronic kidney disease: Secondary | ICD-10-CM | POA: Diagnosis not present

## 2015-07-27 DIAGNOSIS — E785 Hyperlipidemia, unspecified: Secondary | ICD-10-CM | POA: Diagnosis not present

## 2015-07-27 DIAGNOSIS — Z86718 Personal history of other venous thrombosis and embolism: Secondary | ICD-10-CM | POA: Diagnosis not present

## 2015-07-27 DIAGNOSIS — S8002XA Contusion of left knee, initial encounter: Secondary | ICD-10-CM | POA: Diagnosis not present

## 2015-07-27 DIAGNOSIS — S60031A Contusion of right middle finger without damage to nail, initial encounter: Secondary | ICD-10-CM | POA: Diagnosis not present

## 2015-07-27 DIAGNOSIS — S8992XA Unspecified injury of left lower leg, initial encounter: Secondary | ICD-10-CM | POA: Diagnosis present

## 2015-07-27 DIAGNOSIS — Z794 Long term (current) use of insulin: Secondary | ICD-10-CM | POA: Diagnosis not present

## 2015-07-27 DIAGNOSIS — Z79899 Other long term (current) drug therapy: Secondary | ICD-10-CM | POA: Diagnosis not present

## 2015-07-27 DIAGNOSIS — Z853 Personal history of malignant neoplasm of breast: Secondary | ICD-10-CM | POA: Diagnosis not present

## 2015-07-27 DIAGNOSIS — N183 Chronic kidney disease, stage 3 (moderate): Secondary | ICD-10-CM | POA: Insufficient documentation

## 2015-07-27 DIAGNOSIS — Z87448 Personal history of other diseases of urinary system: Secondary | ICD-10-CM | POA: Insufficient documentation

## 2015-07-27 NOTE — ED Notes (Signed)
Pt had a witnessed fall by staff, pt fell out of bed, complains of bilateral knee pain, pt was found on the floor

## 2015-07-27 NOTE — ED Notes (Signed)
Daughter called and said that her mother has been at this facility only two weeks and this is her fourth fall, she's in the last stage of alzheimers and can become aggressive.

## 2015-07-27 NOTE — ED Notes (Signed)
Bed: [id number] Expected date:  Expected time:  Means of arrival:  Comments: Fall, bilateral knee pain

## 2015-07-27 NOTE — ED Notes (Signed)
Pt is resting quietly with eyes closed. Respirations are even, regular, and unlabored. Assess head and patient didn't wake up. No bleeding noted. Family at bedside.

## 2015-07-27 NOTE — ED Provider Notes (Signed)
CSN: [id number]     Arrival date & time 07/27/15  2127 History  By signing my name below, I, Kristina [name], attest that this documentation has been prepared under the direction and in the presence of Davonna Belling, MD. Electronically Signed: Judithann Sauger, ED Scribe. 07/28/2015. 12:05 AM.    Chief Complaint  Patient presents with  . Fall   History provided by: Per nurse's note. No language interpreter was used.   HPI Comments: Level 5 Caveat due to Dementia Kristina [name] is a 81 y.o. female who presents to the Emergency Department. Per nurses note, pt was brought in s/p fall that occurred PTA.   Past Medical History  Diagnosis Date  . Diabetes mellitus   . Hypertension   . Renal disorder   . Cancer ([place]) 2000    sp bilateral masectomy, sp chemo  . DVT (deep venous thrombosis) ([place])   . PE (pulmonary embolism)   . Dementia   . GERD (gastroesophageal reflux disease)   . Dehydration   . Hypokalemia   . GERD (gastroesophageal reflux disease)   . Fracture of ramus of mandible ([place])   . Hyperlipidemia   . Alzheimer disease   . Chronic kidney disease (CKD), stage III (moderate)    Past Surgical History  Procedure Laterality Date  . Breast surgery    . Mastecomy     Family History  Problem Relation Age of Onset  . Hypertension Mother   . Diabetes Mellitus II Father    Social History  Substance Use Topics  . Smoking status: Never Smoker   . Smokeless tobacco: None  . Alcohol Use: No   OB History    No data available     Review of Systems  Unable to perform ROS: Dementia      Allergies  Cortisone; Oxycodone; and Penicillins  Home Medications   Prior to Admission medications   Medication Sig Start Date End Date Taking? Authorizing Provider  acetaminophen (TYLENOL) 325 MG tablet Take 325 mg by mouth 3 (three) times daily.   Yes Historical Provider, MD  ALPRAZolam (XANAX) 0.25 MG tablet Take 0.25 mg by mouth 2 (two) times daily. 07/16/15 08/15/15 Yes  Historical Provider, MD  ALPRAZolam Duanne Moron) 0.5 MG tablet Take 0.5 mg by mouth at bedtime.  07/16/15 08/15/15 Yes Historical Provider, MD  calcitRIOL (ROCALTROL) 0.25 MCG capsule Take 1 capsule (0.25 mcg total) by mouth daily. 04/14/15  Yes Courteney Lyn Mackuen, MD  candesartan (ATACAND) 4 MG tablet Take 4 mg by mouth daily. 07/16/15 07/15/16 Yes Historical Provider, MD  divalproex (DEPAKOTE SPRINKLE) 125 MG capsule Take 250 mg by mouth at bedtime. 07/16/15  Yes Historical Provider, MD  ferrous sulfate 325 (65 FE) MG tablet Take 325 mg by mouth 2 (two) times daily. 07/16/15 07/15/16 Yes Historical Provider, MD  gabapentin (NEURONTIN) 300 MG capsule Take 1 capsule (300 mg total) by mouth at bedtime. 04/14/15  Yes Courteney Lyn Mackuen, MD  insulin aspart (NOVOLOG) 100 UNIT/ML injection Inject 2-6 Units into the skin 4 (four) times daily -  before meals and at bedtime. Blood glucose 150-200=2 units, 201-250=4 units, 251-300=6units   Yes Historical Provider, MD  memantine (NAMENDA) 10 MG tablet Take 1 tablet (10 mg total) by mouth 2 (two) times daily. 04/14/15  Yes Courteney Lyn Mackuen, MD  Multiple Vitamin (MULTIVITAMIN) tablet Take 1 tablet by mouth daily. 04/14/15  Yes Courteney Lyn Mackuen, MD  pantoprazole (PROTONIX) 40 MG tablet Take 40 mg by mouth daily.   Yes Historical  Provider, MD  potassium chloride SA (K-DUR,KLOR-CON) 20 MEQ tablet Take 20 mEq by mouth daily. 07/16/15  Yes Historical Provider, MD  rosuvastatin (CRESTOR) 20 MG tablet Take 1 tablet (20 mg total) by mouth daily. 04/14/15  Yes Courteney Lyn Mackuen, MD  thiothixene (NAVANE) 1 MG capsule Take 2 mg by mouth 2 (two) times daily.    Yes Historical Provider, MD  venlafaxine (EFFEXOR) 50 MG tablet Take 50 mg by mouth 3 (three) times daily with meals.   Yes Historical Provider, MD  vitamin B-12 (CYANOCOBALAMIN) 1000 MCG tablet Take 1 tablet (1,000 mcg total) by mouth daily. 04/14/15  Yes Courteney Lyn Mackuen, MD  acetaminophen (TYLENOL)  500 MG tablet Take 500 mg by mouth every 4 (four) hours as needed for fever.    Historical Provider, MD  ALPRAZolam Duanne Moron) 0.5 MG tablet Take 0.5 mg by mouth 4 (four) times daily as needed for anxiety (control).    Historical Provider, MD  alum & mag hydroxide-simeth (MAALOX/MYLANTA) 200-200-20 MG/5ML suspension Take 30 mLs by mouth every 6 (six) hours as needed for indigestion or heartburn.    Historical Provider, MD  guaifenesin (ROBITUSSIN) 100 MG/5ML syrup Take 200 mg by mouth 4 (four) times daily as needed for cough.    Historical Provider, MD  insulin lispro protamine-lispro (HUMALOG 75/25 MIX) (75-25) 100 UNIT/ML SUSP injection Inject 12-14 Units into the skin 2 (two) times daily. Uses 14 units in the AM and 12 units at supper Patient not taking: Reported on 07/21/2015 04/14/15   Courteney Lyn Mackuen, MD  loperamide (IMODIUM) 2 MG capsule Take 2 mg by mouth daily as needed for diarrhea or loose stools.    Historical Provider, MD  magnesium hydroxide (MILK OF MAGNESIA) 400 MG/5ML suspension Take 30 mLs by mouth daily as needed for mild constipation.    Historical Provider, MD  neomycin-bacitracin-polymyxin (NEOSPORIN) [phone] ointment Apply 1 application topically daily as needed (skin tears).    Historical Provider, MD   BP 129/64 mmHg  Pulse 54  Temp(Src) 98.7 F (37.1 C) (Axillary)  Resp 18  SpO2 100% Physical Exam  Constitutional: She is oriented to person, place, and time. She appears well-developed and well-nourished. No distress.  HENT:  Head: Normocephalic and atraumatic.  Mild cracking on lips  Eyes: Conjunctivae and EOM are normal.  Eyes are held shut but pupils are reactive   Neck: Neck supple. No tracheal deviation present.  Cardiovascular: Normal rate and regular rhythm.   Pulmonary/Chest: Effort normal and breath sounds normal. No respiratory distress.  Abdominal: There is no tenderness.  Musculoskeletal: Normal range of motion.  Moving heads and feet, no step-off,  no evidence of trauma on head.   Neurological: She is alert and oriented to person, place, and time.  Skin: Skin is warm and dry.  Mild bruising to left knee cap Ecchymosis to 2nd and 3rd MCP joint on right hand.   Psychiatric: She has a normal mood and affect. Her behavior is normal.  Nursing note and vitals reviewed.   ED Course  Procedures (including critical care time) DIAGNOSTIC STUDIES: Oxygen Saturation is 98% on RA, normal by my interpretation.    COORDINATION OF CARE: 12:03 AM-  Will consult charts from previous visit for appropriate care.    Labs Review Labs Reviewed - No data to display  Imaging Review No results found.   Davonna Belling, MD has personally reviewed and evaluated these images and lab results as part of her medical decision-making.   EKG Interpretation None  MDM   Final diagnoses:  Fall, initial encounter  Alzheimer's dementia    Patient with possible fall. Only visible trauma is slight ecchymosis on left knee. No visible trauma to head. Has had multiple frequent falls. Will discharge back to nursing home. Does not appear to need imaging at this time.  I personally performed the services described in this documentation, which was scribed in my presence. The recorded information has been reviewed and is accurate.     Davonna Belling, MD 07/28/15 [phone]

## 2015-07-27 NOTE — ED Notes (Signed)
Kristina [name]- daughter [phone]

## 2015-07-28 ENCOUNTER — Emergency Department (HOSPITAL_COMMUNITY)
Admission: EM | Admit: 2015-07-28 | Discharge: 2015-07-28 | Disposition: A | Discharge status: Home / Self Care | Payer: Medicare Other | Attending: Physician Assistant | Admitting: Physician Assistant

## 2015-07-28 ENCOUNTER — Encounter (HOSPITAL_COMMUNITY): Payer: Self-pay | Admitting: *Deleted

## 2015-07-28 ENCOUNTER — Emergency Department (HOSPITAL_COMMUNITY): Payer: Medicare Other

## 2015-07-28 DIAGNOSIS — Z794 Long term (current) use of insulin: Secondary | ICD-10-CM | POA: Insufficient documentation

## 2015-07-28 DIAGNOSIS — Z853 Personal history of malignant neoplasm of breast: Secondary | ICD-10-CM | POA: Insufficient documentation

## 2015-07-28 DIAGNOSIS — Z86711 Personal history of pulmonary embolism: Secondary | ICD-10-CM | POA: Diagnosis not present

## 2015-07-28 DIAGNOSIS — F0280 Dementia in other diseases classified elsewhere without behavioral disturbance: Secondary | ICD-10-CM | POA: Insufficient documentation

## 2015-07-28 DIAGNOSIS — Z86718 Personal history of other venous thrombosis and embolism: Secondary | ICD-10-CM | POA: Insufficient documentation

## 2015-07-28 DIAGNOSIS — I129 Hypertensive chronic kidney disease with stage 1 through stage 4 chronic kidney disease, or unspecified chronic kidney disease: Secondary | ICD-10-CM | POA: Insufficient documentation

## 2015-07-28 DIAGNOSIS — Y9389 Activity, other specified: Secondary | ICD-10-CM | POA: Insufficient documentation

## 2015-07-28 DIAGNOSIS — Z79899 Other long term (current) drug therapy: Secondary | ICD-10-CM | POA: Insufficient documentation

## 2015-07-28 DIAGNOSIS — Z8781 Personal history of (healed) traumatic fracture: Secondary | ICD-10-CM | POA: Insufficient documentation

## 2015-07-28 DIAGNOSIS — Y998 Other external cause status: Secondary | ICD-10-CM | POA: Diagnosis not present

## 2015-07-28 DIAGNOSIS — K219 Gastro-esophageal reflux disease without esophagitis: Secondary | ICD-10-CM | POA: Diagnosis not present

## 2015-07-28 DIAGNOSIS — E876 Hypokalemia: Secondary | ICD-10-CM | POA: Diagnosis not present

## 2015-07-28 DIAGNOSIS — E119 Type 2 diabetes mellitus without complications: Secondary | ICD-10-CM | POA: Diagnosis not present

## 2015-07-28 DIAGNOSIS — N183 Chronic kidney disease, stage 3 (moderate): Secondary | ICD-10-CM | POA: Insufficient documentation

## 2015-07-28 DIAGNOSIS — W19XXXA Unspecified fall, initial encounter: Secondary | ICD-10-CM

## 2015-07-28 DIAGNOSIS — Z880 Allergy status to penicillin: Secondary | ICD-10-CM | POA: Diagnosis not present

## 2015-07-28 DIAGNOSIS — W01198A Fall on same level from slipping, tripping and stumbling with subsequent striking against other object, initial encounter: Secondary | ICD-10-CM | POA: Diagnosis not present

## 2015-07-28 DIAGNOSIS — Y9289 Other specified places as the place of occurrence of the external cause: Secondary | ICD-10-CM | POA: Diagnosis not present

## 2015-07-28 DIAGNOSIS — G309 Alzheimer's disease, unspecified: Secondary | ICD-10-CM | POA: Insufficient documentation

## 2015-07-28 DIAGNOSIS — S0181XA Laceration without foreign body of other part of head, initial encounter: Secondary | ICD-10-CM | POA: Insufficient documentation

## 2015-07-28 DIAGNOSIS — E785 Hyperlipidemia, unspecified: Secondary | ICD-10-CM | POA: Diagnosis not present

## 2015-07-28 NOTE — ED Notes (Signed)
Notified [facility] for transportation back to [facility].

## 2015-07-28 NOTE — Discharge Instructions (Signed)
Fall Prevention in Hospitals, Adult As a hospital patient, your condition and the treatments you receive can increase your risk for falls. Some additional risk factors for falls in a hospital include:  Being in an unfamiliar environment.  Being on bed rest.  Your surgery.  Taking certain medicines.  Your tubing requirements, such as intravenous (IV) therapy or catheters. It is important that you learn how to decrease fall risks while at the hospital. Below are important tips that can help prevent falls. SAFETY TIPS FOR PREVENTING FALLS Talk about your risk of falling.  Ask your health care provider why you are at risk for falling. Is it your medicine, illness, tubing placement, or something else?  Make a plan with your health care provider to keep you safe from falls.  Ask your health care provider or pharmacist about side effects of your medicines. Some medicines can make you dizzy or affect your coordination. Ask for help.  Ask for help before getting out of bed. You may need to press your call button.  Ask for assistance in getting safely to the toilet.  Ask for a walker or cane to be put at your bedside. Ask that most of the side rails on your bed be placed up before your health care provider leaves the room.  Ask family or friends to sit with you.  Ask for things that are out of your reach, such as your glasses, hearing aids, telephone, bedside table, or call button. Follow these tips to avoid falling:  Stay lying or seated, rather than standing, while waiting for help.  Wear rubber-soled slippers or shoes whenever you walk in the hospital.  Avoid quick, sudden movements.  Change positions slowly.  Sit on the side of your bed before standing.  Stand up slowly and wait before you start to walk.  Let your health care provider know if there is a spill on the floor.  Pay careful attention to the medical equipment, electrical cords, and tubes around you.  When you  need help, use your call button by your bed or in the bathroom. Wait for one of your health care providers to help you.  If you feel dizzy or unsure of your footing, return to bed and wait for assistance.  Avoid being distracted by the TV, telephone, or another person in your room.  Do not lean or support yourself on rolling objects, such as IV poles or bedside tables.   This information is not intended to replace advice given to you by your health care provider. Make sure you discuss any questions you have with your health care provider.   Document Released: 07/15/2000 Document Revised: 08/08/2014 Document Reviewed: 03/25/2012 Elsevier Interactive Patient Education 2016 [place] Disease Alzheimer disease is a mental disorder. It causes memory loss and loss of other mental functions, such as learning, thinking, problem solving, communicating, and completing tasks. The mental losses interfere with the ability to perform daily activities at work, at home, or in social situations. Alzheimer disease usually starts in a person's late 63s or early 53s but can start earlier in life (familial form). The mental changes caused by this disease are permanent and worsen over time. As the illness progresses, the ability to do even the simplest things is lost. Survival with Alzheimer disease ranges from several years to as long as 20 years. CAUSES Alzheimer disease is caused by abnormally high levels of a protein (beta-amyloid) in the brain. This protein forms very small deposits within and  around the brain's nerve cells. These deposits prevent the nerve cells from working properly. Experts are not certain what causes the beta-amyloid deposits in this disease. RISK FACTORS The following major risk factors have been identified:  Increasing age.  Certain genetic variations, such as Down syndrome (trisomy 21). SYMPTOMS In the early stages of Alzheimer disease, you are still able to perform  daily activities but need greater effort, more time, or memory aids. Early symptoms include:  Mild memory loss of recent events, names, or phone numbers.  Loss of objects.  Minor loss of vocabulary.  Difficulty with complex tasks, such as paying bills or driving in unfamiliar locations. Other mental functions deteriorate as the disease worsens. These changes slowly go from mild to severe. Symptoms at this stage include:  Difficulty remembering. You may not be able to recall personal information such as your address and telephone number. You may become confused about the date, the season of the year, or your location.  Difficulty maintaining attention. You may forget what you wanted to say during conversations and repeat what you have already said.  Difficulty learning new information or tasks. You may not remember what you read or the name of a new friend you met.  Difficulty counting or doing math. You may have difficulty with complex math problems. You may make mistakes in paying bills or managing your checkbook.  Poor reasoning and judgment. You may make poor decisions or not dress right for the weather.  Difficulty communicating. You may have regular difficulty remembering words, naming objects, expressing yourself clearly, or writing sentences that make sense.  Difficulty performing familiar daily activities. You may get lost driving in familiar locations or need help eating, bathing, dressing, grooming, or using the toilet. You may have difficulty maintaining bladder or bowel control.  Difficulty recognizing familiar faces. You may confuse family members or close friends with one another. You may not recognize a close relative or may mistake strangers for family. Alzheimer disease also may cause changes in personality and behavior. These changes include:   Loss of interest or motivation.  Social withdrawal.  Anxiety.  Difficulty sleeping.  Uncharacteristic anger or  combativeness.  A false belief that someone is trying to harm you (paranoia).  Seeing things that are not real (hallucinations).  Agitation. Confusion and disruptive behavior are often worse at night and may be triggered by changes in the environment or acute medical issues. DIAGNOSIS  Alzheimer disease is diagnosed through an assessment by your health care provider. During this assessment, your health care provider will do the following:  Ask you and your family, friends, or caregivers questions about your symptoms, their frequency, their duration and progression, and the effect they are having on your life.  Ask questions about your personal and family medical history and use of alcohol or drugs, including prescription medicine.  Perform a physical exam and order blood tests and brain imaging exams. Your health care provider may refer you to a specialist for detailed evaluation of your mental functions (neuropsychological testing).  Many different brain disorders, medical conditions, and certain substances can cause symptoms that resemble Alzheimer disease symptoms. These must be ruled out before this disease can be diagnosed. If Alzheimer disease is diagnosed, it will be considered either "possible" or "probable" Alzheimer disease. "Possible" Alzheimer disease means that your symptoms are typical of the disease and no other disorder is causing them. "Probable" Alzheimer disease means that you also have a family history of the disease or genetic test results  that support the diagnosis. Certain tests, mostly used in research studies, are highly specific for Alzheimer disease.  TREATMENT  There is currently no cure for this disease. The goals of treatment are to:  Slow down the progression of the disease.  Preserve mental function as long as possible.  Manage behavioral symptoms.  Make life easier for the person with Alzheimer disease and his or her caregivers. The following treatment  options are available:  Medicine. Certain medicines may help slow memory loss by changing the level of certain chemicals in the brain. Medicine may also help with behavioral symptoms.  Talk therapy. Talk therapy provides education, support, and memory aids for people with this disease. It is most effective in the early stages of the illness.  Caregiving. Caregivers may be family members, friends, or trained medical professionals. They help the person with Alzheimer disease with daily life activities. Caregiving may take place at home or at a nursing facility.  Family support groups. These provide education, emotional support, and information about community resources to family members who are taking care of the person with this disease.   This information is not intended to replace advice given to you by your health care provider. Make sure you discuss any questions you have with your health care provider.   Document Released: 03/29/2004 Document Revised: 08/08/2014 Document Reviewed: 11/23/2012 Elsevier Interactive Patient Education [employer].

## 2015-07-28 NOTE — ED Notes (Signed)
Provider at bedside

## 2015-07-28 NOTE — ED Notes (Signed)
Per ems pt is Kristina [name], was seen and treated in ED yesterday for fall. Pt was in the cafeteria and fell forward out of her walker, and hit her chin on the table. estimated 1 cm laceration to chin. Chipped tooth, but staff could not report if from todays fall. Hx of dementia. c collar applied.

## 2015-07-28 NOTE — ED Notes (Signed)
Bed: WA13 Expected date:  Expected time:  Means of arrival:  Comments: EMS 

## 2015-07-28 NOTE — ED Notes (Signed)
Bed: WHALE Expected date:  Expected time:  Means of arrival:  Comments: 

## 2015-07-28 NOTE — ED Notes (Signed)
Attempted to call [facility] for discharge report and review discharge instructions but unable to reach facility.

## 2015-07-28 NOTE — Discharge Instructions (Signed)
Please follow up with your primary doctor in 2-3 days Return to ER for any new or worsening conditions, any additional concerns.

## 2015-07-28 NOTE — Progress Notes (Signed)
CSW attempted to speak with patient at bedside. However, CSW was not able to get information from patient due to condition. Per note, patient has a history of dementia. The patient was not effectively communicative.   Per note, patient is from [facility] and was treated in [facility] yesterday due to fall. Today Per note, patient presents to [facility] due to fall and facial laceration.   Willette Brace [id number] ED CSW 07/28/2015 8:01 PM
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
## 2015-08-03 ENCOUNTER — Emergency Department (HOSPITAL_COMMUNITY): Payer: Medicare Other

## 2015-08-03 ENCOUNTER — Emergency Department (HOSPITAL_COMMUNITY)
Admission: EM | Admit: 2015-08-03 | Discharge: 2015-08-03 | Disposition: A | Discharge status: Home / Self Care | Payer: Medicare Other | Attending: Emergency Medicine | Admitting: Emergency Medicine

## 2015-08-03 ENCOUNTER — Encounter (HOSPITAL_COMMUNITY): Payer: Self-pay | Admitting: Radiology

## 2015-08-03 DIAGNOSIS — W06XXXA Fall from bed, initial encounter: Secondary | ICD-10-CM | POA: Insufficient documentation

## 2015-08-03 DIAGNOSIS — Z880 Allergy status to penicillin: Secondary | ICD-10-CM | POA: Diagnosis not present

## 2015-08-03 DIAGNOSIS — E785 Hyperlipidemia, unspecified: Secondary | ICD-10-CM | POA: Insufficient documentation

## 2015-08-03 DIAGNOSIS — G309 Alzheimer's disease, unspecified: Secondary | ICD-10-CM | POA: Diagnosis not present

## 2015-08-03 DIAGNOSIS — S199XXA Unspecified injury of neck, initial encounter: Secondary | ICD-10-CM | POA: Diagnosis not present

## 2015-08-03 DIAGNOSIS — F0280 Dementia in other diseases classified elsewhere without behavioral disturbance: Secondary | ICD-10-CM | POA: Diagnosis not present

## 2015-08-03 DIAGNOSIS — I129 Hypertensive chronic kidney disease with stage 1 through stage 4 chronic kidney disease, or unspecified chronic kidney disease: Secondary | ICD-10-CM | POA: Diagnosis not present

## 2015-08-03 DIAGNOSIS — S299XXA Unspecified injury of thorax, initial encounter: Secondary | ICD-10-CM | POA: Insufficient documentation

## 2015-08-03 DIAGNOSIS — N183 Chronic kidney disease, stage 3 (moderate): Secondary | ICD-10-CM | POA: Insufficient documentation

## 2015-08-03 DIAGNOSIS — Z8781 Personal history of (healed) traumatic fracture: Secondary | ICD-10-CM | POA: Diagnosis not present

## 2015-08-03 DIAGNOSIS — Z86711 Personal history of pulmonary embolism: Secondary | ICD-10-CM | POA: Insufficient documentation

## 2015-08-03 DIAGNOSIS — Z794 Long term (current) use of insulin: Secondary | ICD-10-CM | POA: Insufficient documentation

## 2015-08-03 DIAGNOSIS — K219 Gastro-esophageal reflux disease without esophagitis: Secondary | ICD-10-CM | POA: Diagnosis not present

## 2015-08-03 DIAGNOSIS — W19XXXA Unspecified fall, initial encounter: Secondary | ICD-10-CM

## 2015-08-03 DIAGNOSIS — S3992XA Unspecified injury of lower back, initial encounter: Secondary | ICD-10-CM | POA: Diagnosis not present

## 2015-08-03 DIAGNOSIS — E876 Hypokalemia: Secondary | ICD-10-CM | POA: Diagnosis not present

## 2015-08-03 DIAGNOSIS — Y998 Other external cause status: Secondary | ICD-10-CM | POA: Diagnosis not present

## 2015-08-03 DIAGNOSIS — Y92129 Unspecified place in nursing home as the place of occurrence of the external cause: Secondary | ICD-10-CM | POA: Insufficient documentation

## 2015-08-03 DIAGNOSIS — Z853 Personal history of malignant neoplasm of breast: Secondary | ICD-10-CM | POA: Diagnosis not present

## 2015-08-03 DIAGNOSIS — Z79899 Other long term (current) drug therapy: Secondary | ICD-10-CM | POA: Insufficient documentation

## 2015-08-03 DIAGNOSIS — Z86718 Personal history of other venous thrombosis and embolism: Secondary | ICD-10-CM | POA: Diagnosis not present

## 2015-08-03 DIAGNOSIS — Y9389 Activity, other specified: Secondary | ICD-10-CM | POA: Diagnosis not present

## 2015-08-03 DIAGNOSIS — E119 Type 2 diabetes mellitus without complications: Secondary | ICD-10-CM | POA: Insufficient documentation

## 2015-08-03 LAB — COMPREHENSIVE METABOLIC PANEL
ALK PHOS: 96 U/L (ref 38–126)
ALT: 17 U/L (ref 14–54)
ANION GAP: 10 (ref 5–15)
AST: 19 U/L (ref 15–41)
Albumin: 3.2 g/dL — ABNORMAL LOW (ref 3.5–5.0)
BILIRUBIN TOTAL: 0.8 mg/dL (ref 0.3–1.2)
BUN: 25 mg/dL — ABNORMAL HIGH (ref 6–20)
CALCIUM: 9.1 mg/dL (ref 8.9–10.3)
CO2: 27 mmol/L (ref 22–32)
CREATININE: 1.31 mg/dL — AB (ref 0.44–1.00)
Chloride: 106 mmol/L (ref 101–111)
GFR, EST AFRICAN AMERICAN: 43 mL/min — AB (ref >60)
GFR, EST NON AFRICAN AMERICAN: 37 mL/min — AB (ref >60)
Glucose, Bld: 156 mg/dL — ABNORMAL HIGH (ref 65–99)
Potassium: 3.8 mmol/L (ref 3.5–5.1)
Sodium: 143 mmol/L (ref 135–145)
TOTAL PROTEIN: 7 g/dL (ref 6.5–8.1)

## 2015-08-03 LAB — CBG MONITORING, ED: GLUCOSE-CAPILLARY: 145 mg/dL — AB (ref 65–99)

## 2015-08-03 LAB — CBC WITH DIFFERENTIAL/PLATELET
Basophils Absolute: 0 10*3/uL (ref 0.0–0.1)
Basophils Relative: 0 %
Eosinophils Absolute: 0.1 10*3/uL (ref 0.0–0.7)
Eosinophils Relative: 1 %
HEMATOCRIT: 32.9 % — AB (ref 36.0–46.0)
HEMOGLOBIN: 10.5 g/dL — AB (ref 12.0–15.0)
LYMPHS ABS: 1.2 10*3/uL (ref 0.7–4.0)
LYMPHS PCT: 19 %
MCH: 30.3 pg (ref 26.0–34.0)
MCHC: 31.9 g/dL (ref 30.0–36.0)
MCV: 94.8 fL (ref 78.0–100.0)
MONOS PCT: 5 %
Monocytes Absolute: 0.3 10*3/uL (ref 0.1–1.0)
NEUTROS ABS: 4.6 10*3/uL (ref 1.7–7.7)
NEUTROS PCT: 75 %
Platelets: 199 10*3/uL (ref 150–400)
RBC: 3.47 MIL/uL — AB (ref 3.87–5.11)
RDW: 13.4 % (ref 11.5–15.5)
WBC: 6.3 10*3/uL (ref 4.0–10.5)

## 2015-08-03 LAB — I-STAT TROPONIN, ED
Troponin i, poc: 0.02 ng/mL (ref 0.00–0.08)
Troponin i, poc: 0.01 ng/mL (ref 0.00–0.08)

## 2015-08-03 NOTE — ED Notes (Signed)
Attempt to start IV/blood draw twice unsuccessful; [facility] phlebotomy reports will attempts blood draw.

## 2015-08-03 NOTE — ED Notes (Signed)
Per EMS-unwitnessed fall-nursing facility found on floor while making rounds-according to staff, they stated she wasn't acting "right" because she was complaining of pain

## 2015-08-03 NOTE — ED Notes (Addendum)
Pt awake at present time; prior to this pt resting is bed without getting up. Pt attempt to get up without assistance and is fall risk. Bed alarm placed and meal given.

## 2015-08-03 NOTE — ED Provider Notes (Signed)
CSN: [id number]     Arrival date & time 08/03/15  [id number] History   First MD Initiated Contact with Patient 08/03/15 [phone]     Chief Complaint  Patient presents with  . Fall     (Consider location/radiation/quality/duration/timing/severity/associated sxs/prior Treatment) Patient is a 82 y.o. female presenting with fall.  Fall This is a new problem. The problem occurs constantly. The problem has not changed since onset.Associated symptoms include chest pain. Pertinent negatives include no abdominal pain, no headaches and no shortness of breath. Nothing aggravates the symptoms. Nothing relieves the symptoms. She has tried nothing for the symptoms. The treatment provided no relief.    Past Medical History  Diagnosis Date  . Diabetes mellitus   . Hypertension   . Renal disorder   . Cancer ([place]) 2000    sp bilateral masectomy, sp chemo  . DVT (deep venous thrombosis) ([place])   . PE (pulmonary embolism)   . Dementia   . GERD (gastroesophageal reflux disease)   . Dehydration   . Hypokalemia   . GERD (gastroesophageal reflux disease)   . Fracture of ramus of mandible ([place])   . Hyperlipidemia   . Alzheimer disease   . Chronic kidney disease (CKD), stage III (moderate)    Past Surgical History  Procedure Laterality Date  . Breast surgery    . Mastecomy     Family History  Problem Relation Age of Onset  . Hypertension Mother   . Diabetes Mellitus II Father    Social History  Substance Use Topics  . Smoking status: Never Smoker   . Smokeless tobacco: None  . Alcohol Use: No   OB History    No data available     Review of Systems  Unable to perform ROS: Dementia  Respiratory: Negative for shortness of breath.   Cardiovascular: Positive for chest pain.  Gastrointestinal: Negative for abdominal pain.  Musculoskeletal: Positive for back pain and neck pain.  Neurological: Negative for weakness, numbness and headaches.      Allergies  Cortisone; Oxycodone; and  Penicillins  Home Medications   Prior to Admission medications   Medication Sig Start Date End Date Taking? Authorizing Provider  acetaminophen (TYLENOL) 325 MG tablet Take 325 mg by mouth 3 (three) times daily.   Yes Historical Provider, MD  acetaminophen (TYLENOL) 500 MG tablet Take 500 mg by mouth every 4 (four) hours as needed for fever.   Yes Historical Provider, MD  ALPRAZolam (XANAX) 0.25 MG tablet Take 0.25 mg by mouth 2 (two) times daily. 07/16/15 08/15/15 Yes Historical Provider, MD  ALPRAZolam Duanne Moron) 0.5 MG tablet Take 0.5 mg by mouth at bedtime.  07/16/15 08/15/15 Yes Historical Provider, MD  ALPRAZolam Duanne Moron) 0.5 MG tablet Take 0.5 mg by mouth 4 (four) times daily as needed for anxiety (control).   Yes Historical Provider, MD  alum & mag hydroxide-simeth (MAALOX/MYLANTA) 200-200-20 MG/5ML suspension Take 30 mLs by mouth every 6 (six) hours as needed for indigestion or heartburn.   Yes Historical Provider, MD  calcitRIOL (ROCALTROL) 0.25 MCG capsule Take 1 capsule (0.25 mcg total) by mouth daily. 04/14/15  Yes Courteney Lyn Mackuen, MD  candesartan (ATACAND) 4 MG tablet Take 4 mg by mouth daily. 07/16/15 07/15/16 Yes Historical Provider, MD  divalproex (DEPAKOTE SPRINKLE) 125 MG capsule Take 125-250 mg by mouth 2 (two) times daily. Take 125mg  in the am and 250mg  at night 07/16/15  Yes Historical Provider, MD  ferrous sulfate 325 (65 FE) MG tablet Take 325 mg by mouth 2 (  two) times daily. 07/16/15 07/15/16 Yes Historical Provider, MD  gabapentin (NEURONTIN) 300 MG capsule Take 1 capsule (300 mg total) by mouth at bedtime. 04/14/15  Yes Courteney Lyn Mackuen, MD  guaifenesin (ROBITUSSIN) 100 MG/5ML syrup Take 200 mg by mouth 4 (four) times daily as needed for cough.   Yes Historical Provider, MD  insulin aspart (NOVOLOG) 100 UNIT/ML injection Inject 2-6 Units into the skin 4 (four) times daily as needed for high blood sugar. Blood glucose 150-200=2 units, 201-250=4 units, 251-300=6units    Yes Historical Provider, MD  loperamide (IMODIUM) 2 MG capsule Take 2 mg by mouth daily as needed for diarrhea or loose stools.   Yes Historical Provider, MD  Lorazepam POWD Apply 1 application topically every 8 (eight) hours as needed (apply to wrist  for agitation/anxiety). Marland KitchenR-Lorazepam PLO Gel 0.5mg /mL gel  Or Loazepam Prefille p.5mg /o.87mL   Yes Historical Provider, MD  magnesium hydroxide (MILK OF MAGNESIA) 400 MG/5ML suspension Take 30 mLs by mouth daily as needed for mild constipation.   Yes Historical Provider, MD  memantine (NAMENDA) 10 MG tablet Take 1 tablet (10 mg total) by mouth 2 (two) times daily. 04/14/15  Yes Courteney Lyn Mackuen, MD  Multiple Vitamin (DAILY VITAMIN PO) Take 1 tablet by mouth daily.   Yes Historical Provider, MD  Multiple Vitamin (MULTIVITAMIN) tablet Take 1 tablet by mouth daily. 04/14/15  Yes Courteney Lyn Mackuen, MD  neomycin-bacitracin-polymyxin (NEOSPORIN) [phone] ointment Apply 1 application topically daily as needed (skin tears).   Yes Historical Provider, MD  pantoprazole (PROTONIX) 40 MG tablet Take 40 mg by mouth daily.   Yes Historical Provider, MD  potassium chloride SA (K-DUR,KLOR-CON) 20 MEQ tablet Take 20 mEq by mouth daily. 07/16/15  Yes Historical Provider, MD  rosuvastatin (CRESTOR) 20 MG tablet Take 1 tablet (20 mg total) by mouth daily. 04/14/15  Yes Courteney Lyn Mackuen, MD  thiothixene (NAVANE) 1 MG capsule Take 2 mg by mouth 2 (two) times daily.    Yes Historical Provider, MD  venlafaxine (EFFEXOR) 50 MG tablet Take 50 mg by mouth 3 (three) times daily with meals.   Yes Historical Provider, MD  vitamin B-12 (CYANOCOBALAMIN) 1000 MCG tablet Take 1 tablet (1,000 mcg total) by mouth daily. 04/14/15  Yes Courteney Lyn Mackuen, MD  insulin lispro protamine-lispro (HUMALOG 75/25 MIX) (75-25) 100 UNIT/ML SUSP injection Inject 12-14 Units into the skin 2 (two) times daily. Uses 14 units in the AM and 12 units at supper Patient not taking: Reported on  07/21/2015 04/14/15   Courteney Lyn Mackuen, MD   BP 154/65 mmHg  Pulse 97  Temp(Src) 97.8 F (36.6 C) (Oral)  Resp 18  SpO2 97% Physical Exam  Constitutional: She appears well-developed and well-nourished. No distress.  HENT:  Head: Normocephalic and atraumatic.  Mouth/Throat: No oropharyngeal exudate.  Eyes: Conjunctivae and EOM are normal. Pupils are equal, round, and reactive to light.  Cardiovascular: Normal rate, regular rhythm, normal heart sounds and intact distal pulses.  Exam reveals no gallop and no friction rub.   No murmur heard. Pulmonary/Chest: Effort normal and breath sounds normal. No respiratory distress. She has no wheezes. She has no rales. She exhibits tenderness.  Abdominal: Soft. She exhibits no distension. There is no tenderness. There is no guarding.  Musculoskeletal: She exhibits no edema.       Right hip: She exhibits decreased strength.       Left hip: She exhibits decreased strength.       Cervical back: She exhibits tenderness and  bony tenderness.       Thoracic back: She exhibits tenderness and bony tenderness.       Lumbar back: She exhibits tenderness and bony tenderness.  Neurological: She is alert. She has normal strength. No cranial nerve deficit or sensory deficit (denies). Coordination and gait normal.  Oriented to self   Skin: Skin is warm and dry. No rash noted. She is not diaphoretic. No erythema.  Nursing note and vitals reviewed.   ED Course  Procedures (including critical care time) Labs Review Labs Reviewed  CBC WITH DIFFERENTIAL/PLATELET - Abnormal; Notable for the following:    RBC 3.47 (*)    Hemoglobin 10.5 (*)    HCT 32.9 (*)    All other components within normal limits  COMPREHENSIVE METABOLIC PANEL - Abnormal; Notable for the following:    Glucose, Bld 156 (*)    BUN 25 (*)    Creatinine, Ser 1.31 (*)    Albumin 3.2 (*)    GFR calc non Af Amer 37 (*)    GFR calc Af Amer 43 (*)    All other components within normal limits   CBG MONITORING, ED - Abnormal; Notable for the following:    Glucose-Capillary 145 (*)    All other components within normal limits  I-STAT TROPOININ, ED  Randolm Idol, ED    Imaging Review Dg Chest 2 View  08/03/2015  CLINICAL DATA:  Unwitnessed fall at nursing home this morning. Generalized body aches. EXAM: CHEST  2 VIEW 04/12/2014: 04/12/2014 FINDINGS: AP and lateral views of the chest show hyperexpansion. The lungs are clear wiithout focal pneumonia, edema, pneumothorax or pleural effusion. The cardiopericardial silhouette is within normal limits for size. The visualized bony structures of the thorax are intact. IMPRESSION: Hyperinflation without acute findings. Electronically Signed   By: Misty Stanley M.D.   On: 08/03/2015 08:28   Dg Thoracic Spine 2 View  08/03/2015  CLINICAL DATA:  Unwitnessed fall at nursing home earlier today. EXAM: THORACIC SPINE 2 VIEWS COMPARISON:  None. FINDINGS: No evidence for fracture. Intervertebral disc spaces are preserved. Frontal film shows no abnormal paraspinal line. IMPRESSION: Negative. Electronically Signed   By: Misty Stanley M.D.   On: 08/03/2015 08:29   Dg Lumbar Spine 2-3 Views  08/03/2015  CLINICAL DATA:  Fall at nursing home today EXAM: LUMBAR SPINE - 2-3 VIEW COMPARISON:  None. FINDINGS: There is no evidence of lumbar spine fracture. Alignment is normal. Intervertebral disc spaces are maintained. IMPRESSION: Negative. Electronically Signed   By: Misty Stanley M.D.   On: 08/03/2015 08:35   Dg Pelvis 1-2 Views  08/03/2015  CLINICAL DATA:  Fall at nursing today. EXAM: PELVIS - 1-2 VIEW COMPARISON:  None. FINDINGS: There is no evidence of pelvic fracture or diastasis. No pelvic bone lesions are seen. IMPRESSION: Negative. Electronically Signed   By: Misty Stanley M.D.   On: 08/03/2015 08:35   Ct Head Wo Contrast  08/03/2015  CLINICAL DATA:  82 year old female with acute head CT neck injury following fall today. Initial encounter. EXAM: CT HEAD  WITHOUT CONTRAST CT CERVICAL SPINE WITHOUT CONTRAST TECHNIQUE: Multidetector CT imaging of the head and cervical spine was performed following the standard protocol without intravenous contrast. Multiplanar CT image reconstructions of the cervical spine were also generated. COMPARISON:  07/28/2015 and prior exams. FINDINGS: CT HEAD FINDINGS Mild atrophy and chronic small-vessel white matter ischemic changes are again noted. No acute intracranial abnormalities are identified, including mass lesion or mass effect, hydrocephalus, extra-axial fluid collection, midline  shift, hemorrhage, or acute infarction. The visualized bony calvarium is unremarkable. CT CERVICAL SPINE FINDINGS Normal alignment is again noted. There is no evidence of acute fracture, subluxation or prevertebral soft tissue swelling. Anterior osteophytosis/DISH again noted. Mild facet arthropathy within the cervical spine is unchanged. No focal bony lesions identified. The soft tissue structures and lung apices are unremarkable. IMPRESSION: No evidence of acute intracranial or cervical spine abnormality. Mild cerebral atrophy and chronic small-vessel white matter ischemic changes. Electronically Signed   By: Margarette Canada M.D.   On: 08/03/2015 08:25   Ct Cervical Spine Wo Contrast  08/03/2015  CLINICAL DATA:  82 year old female with acute head CT neck injury following fall today. Initial encounter. EXAM: CT HEAD WITHOUT CONTRAST CT CERVICAL SPINE WITHOUT CONTRAST TECHNIQUE: Multidetector CT imaging of the head and cervical spine was performed following the standard protocol without intravenous contrast. Multiplanar CT image reconstructions of the cervical spine were also generated. COMPARISON:  07/28/2015 and prior exams. FINDINGS: CT HEAD FINDINGS Mild atrophy and chronic small-vessel white matter ischemic changes are again noted. No acute intracranial abnormalities are identified, including mass lesion or mass effect, hydrocephalus, extra-axial fluid  collection, midline shift, hemorrhage, or acute infarction. The visualized bony calvarium is unremarkable. CT CERVICAL SPINE FINDINGS Normal alignment is again noted. There is no evidence of acute fracture, subluxation or prevertebral soft tissue swelling. Anterior osteophytosis/DISH again noted. Mild facet arthropathy within the cervical spine is unchanged. No focal bony lesions identified. The soft tissue structures and lung apices are unremarkable. IMPRESSION: No evidence of acute intracranial or cervical spine abnormality. Mild cerebral atrophy and chronic small-vessel white matter ischemic changes. Electronically Signed   By: Margarette Canada M.D.   On: 08/03/2015 08:25   I have personally reviewed and evaluated these images and lab results as part of my medical decision-making.   EKG Interpretation   Date/Time:  Monday August 03 2015 07:22:42 EST Ventricular Rate:  83 PR Interval:  136 QRS Duration: 78 QT Interval:  390 QTC Calculation: 458 R Axis:   25 Text Interpretation:  Sinus rhythm Atrial premature complex Probable left  atrial enlargement Probable left ventricular hypertrophy More prominent T  waves in lateral leads otherwise no significant change since previous  tracing Confirmed by NGUYEN, EMILY ([id number]) on 08/03/2015 8:08:55 AM      MDM   Final diagnoses:  Fall, initial encounter  Alzheimer's dementia   82 year old female with a history of diabetes, hypertension, breast cancer, DVT, PE, dementia presents with concern of fall from her facility. Patient reports neck pain from fall after rolling out of bed as well as pain in the left side of her chest. CT head cervical spine are ordered and showed no acute findings. XR thoracic, lumbar spine and pelvis were within normal limits. Patient reports the chest pain began after the fall however it unclear whether her history is reliable. EKG, delta troponins were ordered and do not suggest ACS. Vital signs are WNL and do not indicate PE.    XR chest without findings to suggest rib fx and pt without signs of pain during time in ED.   Discussed with facility who reports patient at baseline and had no other concerns.  Patient ambulatory in the ED with neurologic exam WNL.  Patient discharged in stable condition with understanding of reasons to return.    Gareth Morgan, MD 08/03/15 2030

## 2015-08-03 NOTE — Progress Notes (Signed)
CSW staffed with nurse. CSW is aware that patient has dementia. CSW attempted to speak with patient at bedside. Patient was asleep at the time.  Genice Rouge [id number] ED CSW 08/03/2015 8:35 AM

## 2015-08-03 NOTE — ED Notes (Addendum)
Upon hourly rounding pt pain assessed pt reports "back was killing me;" with attempt to ask how bad on number scale 1-10 pt does not answer but only reports "it was killing me." Pt appears resting in no distress and shows no objective signs of severe pain.

## 2015-08-03 NOTE — Discharge Instructions (Signed)
Patient with healing chin laceration May bathe now

## 2015-08-04 ENCOUNTER — Emergency Department (HOSPITAL_COMMUNITY)
Admission: EM | Admit: 2015-08-04 | Discharge: 2015-08-05 | Disposition: A | Discharge status: Home / Self Care | Payer: Medicare Other | Attending: Emergency Medicine | Admitting: Emergency Medicine

## 2015-08-04 ENCOUNTER — Encounter (HOSPITAL_COMMUNITY): Payer: Self-pay

## 2015-08-04 ENCOUNTER — Emergency Department (HOSPITAL_COMMUNITY): Payer: Medicare Other

## 2015-08-04 DIAGNOSIS — Z79899 Other long term (current) drug therapy: Secondary | ICD-10-CM | POA: Diagnosis not present

## 2015-08-04 DIAGNOSIS — E119 Type 2 diabetes mellitus without complications: Secondary | ICD-10-CM | POA: Diagnosis not present

## 2015-08-04 DIAGNOSIS — Y9389 Activity, other specified: Secondary | ICD-10-CM | POA: Diagnosis not present

## 2015-08-04 DIAGNOSIS — Z880 Allergy status to penicillin: Secondary | ICD-10-CM | POA: Diagnosis not present

## 2015-08-04 DIAGNOSIS — E785 Hyperlipidemia, unspecified: Secondary | ICD-10-CM | POA: Diagnosis not present

## 2015-08-04 DIAGNOSIS — F02818 Dementia in other diseases classified elsewhere, unspecified severity, with other behavioral disturbance: Secondary | ICD-10-CM | POA: Diagnosis present

## 2015-08-04 DIAGNOSIS — Y998 Other external cause status: Secondary | ICD-10-CM | POA: Insufficient documentation

## 2015-08-04 DIAGNOSIS — Z86711 Personal history of pulmonary embolism: Secondary | ICD-10-CM | POA: Diagnosis not present

## 2015-08-04 DIAGNOSIS — Z8781 Personal history of (healed) traumatic fracture: Secondary | ICD-10-CM | POA: Insufficient documentation

## 2015-08-04 DIAGNOSIS — Z794 Long term (current) use of insulin: Secondary | ICD-10-CM | POA: Insufficient documentation

## 2015-08-04 DIAGNOSIS — Y92129 Unspecified place in nursing home as the place of occurrence of the external cause: Secondary | ICD-10-CM | POA: Insufficient documentation

## 2015-08-04 DIAGNOSIS — I129 Hypertensive chronic kidney disease with stage 1 through stage 4 chronic kidney disease, or unspecified chronic kidney disease: Secondary | ICD-10-CM | POA: Insufficient documentation

## 2015-08-04 DIAGNOSIS — N183 Chronic kidney disease, stage 3 (moderate): Secondary | ICD-10-CM | POA: Insufficient documentation

## 2015-08-04 DIAGNOSIS — Z043 Encounter for examination and observation following other accident: Secondary | ICD-10-CM | POA: Insufficient documentation

## 2015-08-04 DIAGNOSIS — Z86718 Personal history of other venous thrombosis and embolism: Secondary | ICD-10-CM | POA: Diagnosis not present

## 2015-08-04 DIAGNOSIS — W1839XA Other fall on same level, initial encounter: Secondary | ICD-10-CM | POA: Insufficient documentation

## 2015-08-04 DIAGNOSIS — K219 Gastro-esophageal reflux disease without esophagitis: Secondary | ICD-10-CM | POA: Diagnosis not present

## 2015-08-04 DIAGNOSIS — E876 Hypokalemia: Secondary | ICD-10-CM | POA: Diagnosis not present

## 2015-08-04 DIAGNOSIS — G309 Alzheimer's disease, unspecified: Secondary | ICD-10-CM | POA: Diagnosis not present

## 2015-08-04 DIAGNOSIS — F0280 Dementia in other diseases classified elsewhere without behavioral disturbance: Secondary | ICD-10-CM | POA: Diagnosis not present

## 2015-08-04 DIAGNOSIS — Z853 Personal history of malignant neoplasm of breast: Secondary | ICD-10-CM | POA: Diagnosis not present

## 2015-08-04 DIAGNOSIS — W19XXXA Unspecified fall, initial encounter: Secondary | ICD-10-CM

## 2015-08-04 DIAGNOSIS — F0281 Dementia in other diseases classified elsewhere with behavioral disturbance: Secondary | ICD-10-CM | POA: Diagnosis present

## 2015-08-04 DIAGNOSIS — G308 Other Alzheimer's disease: Secondary | ICD-10-CM | POA: Diagnosis not present

## 2015-08-04 LAB — CBG MONITORING, ED: GLUCOSE-CAPILLARY: 208 mg/dL — AB (ref 65–99)

## 2015-08-04 LAB — URINE MICROSCOPIC-ADD ON

## 2015-08-04 LAB — URINALYSIS, ROUTINE W REFLEX MICROSCOPIC
Bilirubin Urine: NEGATIVE
Glucose, UA: NEGATIVE mg/dL
Ketones, ur: NEGATIVE mg/dL
Leukocytes, UA: NEGATIVE
Nitrite: NEGATIVE
Protein, ur: 30 mg/dL — AB
Specific Gravity, Urine: 1.014 (ref 1.005–1.030)
pH: 6.5 (ref 5.0–8.0)

## 2015-08-04 LAB — VALPROIC ACID LEVEL: Valproic Acid Lvl: 15 ug/mL — ABNORMAL LOW (ref 50.0–100.0)

## 2015-08-04 MED ORDER — DIVALPROEX SODIUM 125 MG PO CSDR
250.0000 mg | DELAYED_RELEASE_CAPSULE | Freq: Every day | ORAL | Status: DC
  Administered 2015-08-04: 250 mg via ORAL
  Filled 2015-08-04 (×2): qty 2

## 2015-08-04 MED ORDER — HALOPERIDOL LACTATE 5 MG/ML IJ SOLN
5.0000 mg | Freq: Once | INTRAMUSCULAR | Status: AC
  Administered 2015-08-04: 5 mg via INTRAMUSCULAR
  Filled 2015-08-04: qty 1

## 2015-08-04 MED ORDER — ALUM & MAG HYDROXIDE-SIMETH 200-200-20 MG/5ML PO SUSP: 30.0000 mL | Freq: Four times a day (QID) | ORAL | Status: DC | PRN

## 2015-08-04 MED ORDER — ROSUVASTATIN CALCIUM 20 MG PO TABS
20.0000 mg | ORAL_TABLET | Freq: Every day | ORAL | Status: DC
  Filled 2015-08-04: qty 1

## 2015-08-04 MED ORDER — GABAPENTIN 300 MG PO CAPS
300.0000 mg | ORAL_CAPSULE | Freq: Every day | ORAL | Status: DC
  Administered 2015-08-04: 300 mg via ORAL
  Filled 2015-08-04: qty 1

## 2015-08-04 MED ORDER — FERROUS SULFATE 325 (65 FE) MG PO TABS
325.0000 mg | ORAL_TABLET | Freq: Two times a day (BID) | ORAL | Status: DC
  Administered 2015-08-04: 325 mg via ORAL
  Filled 2015-08-04 (×4): qty 1

## 2015-08-04 MED ORDER — VENLAFAXINE HCL 50 MG PO TABS
50.0000 mg | ORAL_TABLET | Freq: Three times a day (TID) | ORAL | Status: DC
  Administered 2015-08-04: 50 mg via ORAL
  Filled 2015-08-04 (×5): qty 1

## 2015-08-04 MED ORDER — THIOTHIXENE 2 MG PO CAPS
2.0000 mg | ORAL_CAPSULE | Freq: Two times a day (BID) | ORAL | Status: DC
  Administered 2015-08-04: 2 mg via ORAL
  Filled 2015-08-04 (×4): qty 1

## 2015-08-04 MED ORDER — IRBESARTAN 75 MG PO TABS
37.5000 mg | ORAL_TABLET | Freq: Every day | ORAL | Status: DC
  Administered 2015-08-04: 37.5 mg via ORAL
  Filled 2015-08-04 (×2): qty 0.5

## 2015-08-04 MED ORDER — DIVALPROEX SODIUM 125 MG PO CSDR
125.0000 mg | DELAYED_RELEASE_CAPSULE | Freq: Every day | ORAL | Status: DC
  Filled 2015-08-04: qty 1

## 2015-08-04 MED ORDER — LOPERAMIDE HCL 2 MG PO CAPS: 2.0000 mg | ORAL_CAPSULE | Freq: Every day | ORAL | Status: DC | PRN

## 2015-08-04 MED ORDER — MEMANTINE HCL 10 MG PO TABS
10.0000 mg | ORAL_TABLET | Freq: Two times a day (BID) | ORAL | Status: DC
  Administered 2015-08-04: 10 mg via ORAL
  Filled 2015-08-04 (×3): qty 1

## 2015-08-04 MED ORDER — GUAIFENESIN 100 MG/5ML PO SYRP
200.0000 mg | ORAL_SOLUTION | Freq: Four times a day (QID) | ORAL | Status: DC | PRN
  Filled 2015-08-04: qty 10

## 2015-08-04 MED ORDER — PANTOPRAZOLE SODIUM 40 MG PO TBEC
40.0000 mg | DELAYED_RELEASE_TABLET | Freq: Every day | ORAL | Status: DC
  Administered 2015-08-04: 40 mg via ORAL
  Filled 2015-08-04: qty 1

## 2015-08-04 MED ORDER — CALCITRIOL 0.25 MCG PO CAPS
0.2500 ug | ORAL_CAPSULE | Freq: Every day | ORAL | Status: DC
  Administered 2015-08-04: 0.25 ug via ORAL
  Filled 2015-08-04 (×2): qty 1

## 2015-08-04 MED ORDER — INSULIN ASPART 100 UNIT/ML ~~LOC~~ SOLN
2.0000 [IU] | Freq: Four times a day (QID) | SUBCUTANEOUS | Status: DC | PRN
  Administered 2015-08-04: 4 [IU] via SUBCUTANEOUS
  Filled 2015-08-04: qty 1

## 2015-08-04 MED ORDER — VITAMIN B-12 1000 MCG PO TABS
1000.0000 ug | ORAL_TABLET | Freq: Every day | ORAL | Status: DC
  Filled 2015-08-04: qty 1

## 2015-08-04 MED ORDER — ALPRAZOLAM 0.5 MG PO TABS
0.5000 mg | ORAL_TABLET | Freq: Every day | ORAL | Status: DC
  Administered 2015-08-04: 0.5 mg via ORAL
  Filled 2015-08-04: qty 1

## 2015-08-04 NOTE — Progress Notes (Signed)
ED CM received a call from Rulon Eisenmenger, NP, [phone] seeing pt at [facility] once a week,  states she has already written for increased level of care for pt and PT at the facility has seen her and [facility] is working on getting pt to higher level of care  This was shared with ED SW

## 2015-08-04 NOTE — ED Notes (Signed)
Daughter called stating "I was told [facility] would be a good place for my mother.  There # [phone].  My name is Willaim Bane, her only daughter, my # [phone]."

## 2015-08-04 NOTE — Progress Notes (Signed)
CSW consulted with Nurse CM who states that she has provided husband with a list of SNF, and a list of private duty nursing agencies.    CSW met with husband at bedside. Patient was asleep. CSW inquired if the husband had any questions for CSW. Husband stated jokingly of how CSW could assist him with paying for private duty nurse. CSW provided encouragement. CSW informed husband that if family does decide to hire someone privately that it would not have to be for an entire 24 hours, and that it would be great if family could talk about a plan regarding finances and schedules in order to best assist patient.  CSW spoke with daughter Solmon Ice who states that she does not believe family can afford private duty nursing at this time.  CSW staffed this patient with [place] Surveyor, quantity.   Daughter/Zan [phone]  Willette Brace [phone] ED CSW 08/04/2015 6:59 PM

## 2015-08-04 NOTE — ED Notes (Signed)
Spoke with Kristina [name] in the pharmacy. He stated to go ahead and give all of the patient's medications that she missed while patient was asleep.

## 2015-08-04 NOTE — ED Notes (Signed)
Bed: WA09 Expected date:  Expected time:  Means of arrival:  Comments: fall 

## 2015-08-04 NOTE — ED Notes (Signed)
Safety sitter is at bedside with patient.

## 2015-08-04 NOTE — Progress Notes (Signed)
Spoke with a lee ann at Dr Romeo Apple office # as [phone]  Left a message for [employer] including CM mobile # to discuss pt. Kristina [name] unable to recall Lisa's last name  EDP updated

## 2015-08-04 NOTE — ED Notes (Addendum)
Patient resting with eyes closed

## 2015-08-04 NOTE — ED Notes (Signed)
Family at bedside. 

## 2015-08-04 NOTE — BH Assessment (Signed)
Assessment Note  Kristina [name] is an 82 y.o. female with history of Alzhimers Disease. She presents to [facility] from [facility] (SNF) with c/o a fall. Patient  w/ hx of frequent falls. Patient seen in [facility] Emergency Departments multiple times over the past 2 weeks due to falls; last seen a few days ago.  Writer met with patient and spouse. Patient calm and cooperative. She was noted to be resting and drowsy. Her spouse/POA in addition to daughter/Zanas[place]oy [phone]. The information provided is that patient was living at home with her spouse up until 4-5 months ago. Approximately 4-5 months ago patient started exhibiting behaviors such as wandering, paranoia, locking herself in a car, climbing fences, etc. In addition, patient was also having multiple falls. Patient was sent to [facility] for medical clearance and [facility] evaluation. She was evaluated and it was determined that patient met no criteria; discharged home. Patient returned home exhibiting the same behaviors resulting in being brought back to the Emergency Department. Patient was then sent to [facility] where she was hospitalized for 80 days. Patient was discharged to [facility] (memory care) where she has stayed for 18 days. Since her stay at [facility] patient has experienced 8 falls resulting in multiple visits to the ED. Patient here today with increased confusion and difficulty redirecting.   Patient has not voiced or has history of SI, HI, and AVH's. Patient does not have a history of related symptoms. Patient does not appear to be responding to internal stimuli. Per the daughter patient does have a history of paranoid behaviors and hallucinating. No history of alcohol and drug use.     Diagnosis: Dementia   Past Medical History:  Past Medical History  Diagnosis Date  . Diabetes mellitus   . Hypertension   . Renal disorder   . Cancer ([place]) 2000    sp bilateral masectomy, sp chemo  . DVT (deep venous  thrombosis) ([place])   . PE (pulmonary embolism)   . Dementia   . GERD (gastroesophageal reflux disease)   . Dehydration   . Hypokalemia   . GERD (gastroesophageal reflux disease)   . Fracture of ramus of mandible ([place])   . Hyperlipidemia   . Alzheimer disease   . Chronic kidney disease (CKD), stage III (moderate)     Past Surgical History  Procedure Laterality Date  . Breast surgery    . Mastecomy      Family History:  Family History  Problem Relation Age of Onset  . Hypertension Mother   . Diabetes Mellitus II Father     Social History:  reports that she has never smoked. She does not have any smokeless tobacco history on file. She reports that she does not drink alcohol or use illicit drugs.  Additional Social History:  Alcohol / Drug Use Pain Medications: SEE MAR Prescriptions: SEE MAR Over the Counter: SEE MAR History of alcohol / drug use?: No history of alcohol / drug abuse  CIWA: CIWA-Ar BP: 149/95 mmHg Pulse Rate: 87 COWS:    Allergies:  Allergies  Allergen Reactions  . Cortisone Other (See Comments)    Injection-altered mental status, didn't know her name, where she lived, etc.     . Oxycodone Other (See Comments)    Caused patient to go crazy  . Penicillins Rash    Per MAR     Home Medications:  (Not in a hospital admission)  OB/GYN Status:  No LMP recorded. Patient is postmenopausal.  General Assessment Data  Location of Assessment: [facility] ED TTS Assessment: In system Is this a Tele or Face-to-Face Assessment?: Face-to-Face Is this an Initial Assessment or a Re-assessment for this encounter?: Initial Assessment Marital status: Married Almyra name:  (n/a) Is patient pregnant?: No Pregnancy Status: No Living Arrangements: Other (Comment) ([place] (ALF)) Can pt return to current living arrangement?: Yes Admission Status: Voluntary Is patient capable of signing voluntary admission?: Yes Referral Source: Other ([place]) Insurance  type:  Nurse, mental health)     Crisis Care Plan Living Arrangements: Other (Comment) ([place] (ALF)) Legal Guardian: Other: (Spouse-) Name of Psychiatrist: None Name of Therapist: None  Education Status Is patient currently in school?: No Current Grade:  (Patient did not finish school ) Highest grade of school patient has completed: na Name of school: na Contact person: na  Risk to self with the past 6 months Suicidal Ideation: No Has patient been a risk to self within the past 6 months prior to admission? : No Suicidal Intent: No Has patient had any suicidal intent within the past 6 months prior to admission? : No Is patient at risk for suicide?: No Suicidal Plan?: No Access to Means: No What has been your use of drugs/alcohol within the last 12 months?:  (None reported) Previous Attempts/Gestures: No How many times?:  (0) Other Self Harm Risks:  (Hx of wandering and running away) Triggers for Past Attempts:  (n/a) Intentional Self Injurious Behavior: None Family Suicide History: Unknown Recent stressful life event(s): Other (Comment) (None Reported) Persecutory voices/beliefs?: No Depression: No Depression Symptoms: Feeling angry/irritable Substance abuse history and/or treatment for substance abuse?: No Suicide prevention information given to non-admitted patients: Not applicable  Risk to Others within the past 6 months Homicidal Ideation: No Does patient have any lifetime risk of violence toward others beyond the six months prior to admission? : Yes (comment) (Hx of aggression towards husband and ALF staff) Thoughts of Harm to Others: No Current Homicidal Intent: No Current Homicidal Plan: No Access to Homicidal Means: No Identified Victim:  (n/a) History of harm to others?: Yes Assessment of Violence: On admission Violent Behavior Description:  (Hx of aggression and outburst ) Does patient have access to weapons?: No Criminal Charges Pending?: No Does patient have  a court date: No Is patient on probation?: No  Psychosis Hallucinations: None noted Delusions: None noted  Mental Status Report Appearance/Hygiene: Unremarkable Eye Contact: Good Motor Activity: Freedom of movement Speech: Soft Level of Consciousness: Quiet/awake Mood: Euthymic Affect: Other (Comment) (level affect) Anxiety Level: None Thought Processes: Unable to Assess Judgement: Impaired Orientation: Person Obsessive Compulsive Thoughts/Behaviors: None  Cognitive Functioning Concentration: Decreased Memory: Recent Impaired, Remote Impaired IQ: Average Insight: Poor Impulse Control: Poor Appetite: Good Weight Loss:  (0) Weight Gain:  (0) Sleep: No Change Total Hours of Sleep:  (8 hrs of sleep per day) Vegetative Symptoms: None  ADLScreening [facility] Assessment Services) Patient's cognitive ability adequate to safely complete daily activities?: No Patient able to express need for assistance with ADLs?: Yes Independently performs ADLs?: No  Prior Inpatient Therapy Prior Inpatient Therapy: Yes Prior Therapy Dates: 2016 (2016 - Admitted to [facility] for 80 days) Prior Therapy Facilty/Provider(s): [place] Reason for Treatment: Aggression  Prior Outpatient Therapy Prior Outpatient Therapy: No Prior Therapy Dates: na Prior Therapy Facilty/Provider(s): na Reason for Treatment: na Does patient have an ACCT team?: No Does patient have Intensive In-House Services?  : No Does patient have [facility] services? : No Does patient have P4CC services?: No  ADL Screening (condition  at time of admission) Patient's cognitive ability adequate to safely complete daily activities?: No Is the patient deaf or have difficulty hearing?: No Does the patient have difficulty seeing, even when wearing glasses/contacts?: Yes Does the patient have difficulty concentrating, remembering, or making decisions?: Yes Patient able to express need for assistance with ADLs?:  Yes Does the patient have difficulty dressing or bathing?: Yes Independently performs ADLs?: No Communication: Independent Is this a change from baseline?: Pre-admission baseline Dressing (OT): Dependent Is this a change from baseline?: Pre-admission baseline Grooming: Dependent Is this a change from baseline?: Pre-admission baseline Feeding: Dependent Is this a change from baseline?: Pre-admission baseline Bathing: Dependent Is this a change from baseline?: Pre-admission baseline Toileting: Needs assistance, Dependent Is this a change from baseline?: Pre-admission baseline In/Out Bed: Dependent, Needs assistance Is this a change from baseline?: Pre-admission baseline Walks in Home: Needs assistance (Patient uses a walker) Is this a change from baseline?: Pre-admission baseline Does the patient have difficulty walking or climbing stairs?: No Weakness of Legs: None Weakness of Arms/Hands: None  Home Assistive Devices/Equipment Home Assistive Devices/Equipment:  (Patient uses a walker but has falls even with use of walker)    Abuse/Neglect Assessment (Assessment to be complete while patient is alone) Physical Abuse:  (UNK') Verbal Abuse:  (DUNK) Sexual Abuse:  (UNK) Exploitation of patient/patient's resources:  (UNK) Values / Beliefs Cultural Requests During Hospitalization: None Spiritual Requests During Hospitalization: None   Advance Directives (For Healthcare) Does patient have an advance directive?: No    Additional Information 1:1 In Past 12 Months?: No CIRT Risk: Yes Elopement Risk: Yes Does patient have medical clearance?: Yes     Disposition:  Disposition Disposition of Patient: Other dispositions (Re-evaluate in the morning) Other disposition(s): Other (Comment) (Re-evaluate in the morning per Reginold Agent, NP)  On Site Evaluation by:   Reviewed with Physician:    Waldon Merl [facility] 08/04/2015 5:42 PM

## 2015-08-04 NOTE — Progress Notes (Addendum)
ED Cm and ED Sw consulted with ED PA/NP about pt Concerned about pt frequent ED visits x 11 in last 6 months and need to get pt to a higher level of care for pt safety  ED PA/NP entered PT eval & treatment order in EPIC to attempt to assist pt ALF in processing pt for placement only  The pt assisted living staff needs encouragement to complete the process

## 2015-08-04 NOTE — ED Notes (Signed)
In to pt's room to introduce self, pt is lying in hospital bed, spouse @ BS.  Pt is currently sleeping.

## 2015-08-04 NOTE — ED Notes (Signed)
Pt has been up and down out of bed with 3-4 staff members trying to redirect her.  She intermittently gets agitated, trying to grab objects to strike the staff with.  EDP is aware and has been witness to the behavior.

## 2015-08-04 NOTE — Evaluation (Signed)
Physical Therapy Evaluation-1x Patient Details Name: ROBERTINE PHUNG MRN: [id number] DOB: 04/11/1931 Today's Date: 08/04/2015   History of Present Illness  82 yo female admitted with recurrent falls from ALF. Hx of dementia, DM, HTN, DVT, PE  Clinical Impression  On eval, pt required close supervision level assist for mobility. Pt did not follow commands.  Near constant redirection required from family/nursing. Pt will not meaningfully participate with therapy. Recommend 24 hour supervision/assist for safety.     Follow Up Recommendations No PT follow up;Supervision/Assistance - 24 hour (Pt appears to need 24 hour supervision/assist custodial care-consider SNF)    Equipment Recommendations  None recommended by PT    Recommendations for Other Services       Precautions / Restrictions Precautions Precautions: Fall Restrictions Weight Bearing Restrictions: No      Mobility  Bed Mobility Overal bed mobility: Modified Independent             General bed mobility comments: husband had to tell pt to "get up and walk".   Transfers Overall transfer level: Modified independent                  Ambulation/Gait Ambulation/Gait assistance: Supervision Ambulation Distance (Feet): 40 Feet (20x 2 (x1 with walker, x1 without)) Assistive device: Rolling walker (2 wheeled);None Gait Pattern/deviations: Step-through pattern     General Gait Details: Began with use of RW-pt carrying/tilting walker forward-multimodal cues for safe use of RW-pt not following commands/non compliant. Removed walker-pt able to continue walking unassisted. Easily distracted by environment. Overall, close supervision for safety. Pt declined to walk farther with PT.  Stairs            Wheelchair Mobility    Modified Rankin (Stroke Patients Only)       Balance Overall balance assessment: History of Falls;Needs assistance             Standing balance comment: unable to officially assess  balance due to pt not following commands. No LOB noted during brief walk.                              Pertinent Vitals/Pain Pain Assessment: Faces Faces Pain Scale: No hurt    Home Living Family/patient expects to be discharged to:: Assisted living [facility])                      Prior Function                 Hand Dominance        Extremity/Trunk Assessment   Upper Extremity Assessment: Difficult to assess due to impaired cognition           Lower Extremity Assessment: Difficult to assess due to impaired cognition         Communication   Communication: No difficulties  Cognition Arousal/Alertness: Awake/alert Behavior During Therapy: Restless Overall Cognitive Status: History of cognitive impairments - at baseline                      General Comments      Exercises        Assessment/Plan    PT Assessment Patent does not need any further PT services  PT Diagnosis     PT Problem List    PT Treatment Interventions     PT Goals (Current goals can be found in the Care Plan section) Acute Rehab PT Goals Patient  Stated Goal: none stated PT Goal Formulation: All assessment and education complete, DC therapy    Frequency     Barriers to discharge        Co-evaluation               End of Session   Activity Tolerance: Patient tolerated treatment well Patient left: with nursing/sitter in room (standing in room with NT)      Functional Assessment Tool Used: clinical judgement Functional Limitation: Mobility: Walking and moving around Mobility: Walking and Moving Around Current Status [id number]): At least 1 percent but less than 20 percent impaired, limited or restricted Mobility: Walking and Moving Around Goal Status [id number]): At least 1 percent but less than 20 percent impaired, limited or restricted Mobility: Walking and Moving Around Discharge Status [phone]): At least 1 percent but less than 20 percent  impaired, limited or restricted    Time: [id number] PT Time Calculation (min) (ACUTE ONLY): 8 min   Charges:   PT Evaluation $PT Eval Low Complexity: 1 Procedure     PT G Codes:   PT G-Codes **NOT FOR INPATIENT CLASS** Functional Assessment Tool Used: clinical judgement Functional Limitation: Mobility: Walking and moving around Mobility: Walking and Moving Around Current Status [id number]): At least 1 percent but less than 20 percent impaired, limited or restricted Mobility: Walking and Moving Around Goal Status [phone]): At least 1 percent but less than 20 percent impaired, limited or restricted Mobility: Walking and Moving Around Discharge Status [phone]): At least 1 percent but less than 20 percent impaired, limited or restricted    Weston Anna, MPT Pager: [phone]

## 2015-08-04 NOTE — ED Notes (Signed)
TTS has called for briefing on pt.  Pt is in darkened room with sitter, husband, son, and daughter-in-law and she is difficult to redirect.

## 2015-08-04 NOTE — ED Notes (Signed)
Pt continues to become more agitated and needs multiple staff members to redirect her back to her room/bed.  She tries to bite and hit when redirected.  EDP made aware.

## 2015-08-04 NOTE — Progress Notes (Signed)
CSW staffed with PA regarding patient. PA stated he spoke with someone at at [facility]. PA wanted to know if CSW could follow up regarding higher level care.   CSW spoke with [name] at [facility]. She stated their facility was considering patient for [facility] and there doctor will not be back until next week.   CSW spoke with Gayla Medicus from [place] regarding patient. She stated she would come to the ED to see patient. She also stated she would need a PT eval completed on patient.   CSW spoke with PA to inform him CSW spoke with Liason from [facility] and a PT eval was requested. PA stated he would put in the eval for patient.   CSW staffed case with Nurse CM and ED Physician. Physician recommending TTS Consult and [facility].   CSW met with [place]. Per Liason, patient is not appropriate for any of the facilities at [facility], as they do not have locked units.   Genice Rouge [phone] ED CSW 08/04/2015 3:52 PM

## 2015-08-04 NOTE — ED Notes (Signed)
Pt was up in hallway with pants but no top on and walking toward nurse station.  Redirection was difficult as pt is very confused.  Two people needed to walk her back to the bed.  Bed alarm and cardiac monitor placed. Lights dimmed for comfort.  Will monitor closely

## 2015-08-04 NOTE — ED Notes (Signed)
Pt from [place], [place] w/ hx of frequent recent falls. Hx alzheimers.  Was seen here a few days ago for same.  Per report, she was witnessed falling back onto hard floor onto head.  No LOC.  EMS placed C-collar but noted no neuro deficits.  Pt c/o pain to back of head where EMS noted hematoma as well as another one from the previous fall a few days ago.

## 2015-08-04 NOTE — ED Notes (Signed)
Bed: [id number] Expected date:  Expected time:  Means of arrival:  Comments: [place], [place]
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
## 2015-08-04 NOTE — Progress Notes (Signed)
Pt listed MD at [facility] is Dr Sande Brothers

## 2015-08-04 NOTE — Progress Notes (Signed)
CM received a call from Dr Romeo Apple [phone]) stating Rulon Eisenmenger NP sees this pt and he is not familiar with her.  CM provided CM contact information to give to Lattie Haw to attempt to assist this pt to get to a higher level of care Pending a return call

## 2015-08-04 NOTE — Progress Notes (Signed)
ED CM consulted by [facility] ED PA  Inquired about group home placement  Referred Kristina [name] to ED SW

## 2015-08-04 NOTE — Progress Notes (Signed)
ED CM spoke with EDP and ED RN about pt concerns at ED nursing station. Reviewed various options for d/c plans (return to ALF with private duty or home health, stay in ED for placement, and need of memory care) ED CM observed [facility] PT staff attempt to work with pt She was noted to be agitated and needing lots of redirection with use also of 2 [facility] ED CNAs As husband passes nursing station EDP directs CM to him CM spoke with husband CM reviewed in details medicare guidelines, Private duty nursing (PDN-coverage, length of stay in the home types of staff available),  assisted living (ALF- coverage, services offered) and Skilled nursing facilities (snf- coverage and services offered). CM provided pt/family with a list of [facility] PDN, ALF and Snfs  Husband states his daughter will know more about PDN than he States his choice is to allow PDN to come be with pt at the facility Cm discussed medicaid long term care policy Husband not sure if pt has this on her medicaid coverage or not This would allow PDN to be covered and not be and out of pocket expense Teach back method used  Husband discussed with CM that he feels the facility has too many patients that "are not being watched" Reports "there is another one over there they need to watch"  Husband states   CM reviewed plan of care and ED interventions with EDP and ED SW. EDP discussed TTS consult-ordered. EDP states TTS consult for med management and [place] snf placement. EDP has spoken to ED nursing director Refer to ED SW notes related interaction with [facility] and [facility]  Husband was not aware of [facility] being looked at by [employer] staff when Cm mentioned it to him Cm gave him a list of [place] snfs to review to asked with choice of facility for pt

## 2015-08-04 NOTE — ED Provider Notes (Signed)
CSN: [id number]     Arrival date & time 08/04/15  [id number] History   First MD Initiated Contact with Patient 08/04/15 [phone]     Chief Complaint  Patient presents with  . Fall     (Consider location/radiation/quality/duration/timing/severity/associated sxs/prior Treatment) HPI Patient presents to the emergency department with a fall that occurred just prior to arrival.  The patient has had multiple falls over the last 2 weeks.  She is in an assisted-living facility.  I spoke with the facility and there did not do to place her in a skilled facility due to her worsening condition and worsening Alzheimer's patient is unable to give me any history. Past Medical History  Diagnosis Date  . Diabetes mellitus   . Hypertension   . Renal disorder   . Cancer ([place]) 2000    sp bilateral masectomy, sp chemo  . DVT (deep venous thrombosis) ([place])   . PE (pulmonary embolism)   . Dementia   . GERD (gastroesophageal reflux disease)   . Dehydration   . Hypokalemia   . GERD (gastroesophageal reflux disease)   . Fracture of ramus of mandible ([place])   . Hyperlipidemia   . Alzheimer disease   . Chronic kidney disease (CKD), stage III (moderate)    Past Surgical History  Procedure Laterality Date  . Breast surgery    . Mastecomy     Family History  Problem Relation Age of Onset  . Hypertension Mother   . Diabetes Mellitus II Father    Social History  Substance Use Topics  . Smoking status: Never Smoker   . Smokeless tobacco: None  . Alcohol Use: No   OB History    No data available     Review of Systems  Level V caveat applies due to Alzheimer's  Allergies  Cortisone; Oxycodone; and Penicillins  Home Medications   Prior to Admission medications   Medication Sig Start Date End Date Taking? Authorizing Provider  acetaminophen (TYLENOL) 325 MG tablet Take 325 mg by mouth 3 (three) times daily.   Yes Historical Provider, MD  acetaminophen (TYLENOL) 500 MG tablet Take 500 mg by mouth every 4  (four) hours as needed for fever.   Yes Historical Provider, MD  ALPRAZolam (XANAX) 0.25 MG tablet Take 0.25 mg by mouth 2 (two) times daily. 07/16/15 08/15/15 Yes Historical Provider, MD  ALPRAZolam Duanne Moron) 0.5 MG tablet Take 0.5 mg by mouth at bedtime.  07/16/15 08/15/15 Yes Historical Provider, MD  ALPRAZolam Duanne Moron) 0.5 MG tablet Take 0.5 mg by mouth 4 (four) times daily as needed for anxiety (control).   Yes Historical Provider, MD  alum & mag hydroxide-simeth (MAALOX/MYLANTA) 200-200-20 MG/5ML suspension Take 30 mLs by mouth every 6 (six) hours as needed for indigestion or heartburn.   Yes Historical Provider, MD  calcitRIOL (ROCALTROL) 0.25 MCG capsule Take 1 capsule (0.25 mcg total) by mouth daily. 04/14/15  Yes Courteney Lyn Mackuen, MD  candesartan (ATACAND) 4 MG tablet Take 4 mg by mouth daily. 07/16/15 07/15/16 Yes Historical Provider, MD  divalproex (DEPAKOTE SPRINKLE) 125 MG capsule Take 125-250 mg by mouth 2 (two) times daily. Take 125mg  in the am and 250mg  at night 07/16/15  Yes Historical Provider, MD  ferrous sulfate 325 (65 FE) MG tablet Take 325 mg by mouth 2 (two) times daily. 07/16/15 07/15/16 Yes Historical Provider, MD  gabapentin (NEURONTIN) 300 MG capsule Take 1 capsule (300 mg total) by mouth at bedtime. 04/14/15  Yes Courteney Lyn Mackuen, MD  guaifenesin (ROBITUSSIN) 100 MG/5ML  syrup Take 200 mg by mouth 4 (four) times daily as needed for cough.   Yes Historical Provider, MD  insulin aspart (NOVOLOG) 100 UNIT/ML injection Inject 2-6 Units into the skin 4 (four) times daily as needed for high blood sugar. Blood glucose 150-200=2 units, 201-250=4 units, 251-300=6units   Yes Historical Provider, MD  loperamide (IMODIUM) 2 MG capsule Take 2 mg by mouth daily as needed for diarrhea or loose stools.   Yes Historical Provider, MD  Lorazepam POWD Apply 1 application topically every 8 (eight) hours as needed (apply to wrist  for agitation/anxiety). Marland KitchenR-Lorazepam PLO Gel 0.5mg /mL gel  Or  Loazepam Prefille p.5mg /o.28mL   Yes Historical Provider, MD  magnesium hydroxide (MILK OF MAGNESIA) 400 MG/5ML suspension Take 30 mLs by mouth daily as needed for mild constipation.   Yes Historical Provider, MD  memantine (NAMENDA) 10 MG tablet Take 1 tablet (10 mg total) by mouth 2 (two) times daily. 04/14/15  Yes Courteney Lyn Mackuen, MD  Multiple Vitamin (MULTIVITAMIN) tablet Take 1 tablet by mouth daily. 04/14/15  Yes Courteney Lyn Mackuen, MD  neomycin-bacitracin-polymyxin (NEOSPORIN) [phone] ointment Apply 1 application topically daily as needed (skin tears).   Yes Historical Provider, MD  pantoprazole (PROTONIX) 40 MG tablet Take 40 mg by mouth daily.   Yes Historical Provider, MD  potassium chloride SA (K-DUR,KLOR-CON) 20 MEQ tablet Take 20 mEq by mouth daily. 07/16/15  Yes Historical Provider, MD  rosuvastatin (CRESTOR) 20 MG tablet Take 1 tablet (20 mg total) by mouth daily. 04/14/15  Yes Courteney Lyn Mackuen, MD  thiothixene (NAVANE) 1 MG capsule Take 2 mg by mouth 2 (two) times daily.    Yes Historical Provider, MD  venlafaxine (EFFEXOR) 50 MG tablet Take 50 mg by mouth 3 (three) times daily with meals.   Yes Historical Provider, MD  vitamin B-12 (CYANOCOBALAMIN) 1000 MCG tablet Take 1 tablet (1,000 mcg total) by mouth daily. 04/14/15  Yes Courteney Lyn Mackuen, MD  insulin lispro protamine-lispro (HUMALOG 75/25 MIX) (75-25) 100 UNIT/ML SUSP injection Inject 12-14 Units into the skin 2 (two) times daily. Uses 14 units in the AM and 12 units at supper Patient not taking: Reported on 07/21/2015 04/14/15   Courteney Lyn Mackuen, MD   BP 149/95 mmHg  Pulse 87  Temp(Src) 98.2 F (36.8 C) (Oral)  Resp 18  SpO2 97% Physical Exam  Constitutional: She is oriented to person, place, and time. She appears well-developed and well-nourished. No distress.  HENT:  Head: Normocephalic and atraumatic.  Mouth/Throat: Oropharynx is clear and moist.  Eyes: Pupils are equal, round, and reactive to  light.  Neck: Normal range of motion. Neck supple.  Cardiovascular: Normal rate, regular rhythm and normal heart sounds.  Exam reveals no gallop and no friction rub.   No murmur heard. Pulmonary/Chest: Effort normal and breath sounds normal. No respiratory distress. She has no wheezes.  Abdominal: Soft. Bowel sounds are normal. She exhibits no distension. There is no tenderness.  Neurological: She is alert and oriented to person, place, and time. She exhibits normal muscle tone. Coordination normal.  Skin: Skin is warm and dry.  Psychiatric: She has a normal mood and affect. Her behavior is normal.  Nursing note and vitals reviewed.   ED Course  Procedures (including critical care time) Labs Review Labs Reviewed  VALPROIC ACID LEVEL - Abnormal; Notable for the following:    Valproic Acid Lvl 15 (*)    All other components within normal limits  URINALYSIS, ROUTINE W REFLEX MICROSCOPIC (NOT AT  [facility]) - Abnormal; Notable for the following:    Hgb urine dipstick TRACE (*)    Protein, ur 30 (*)    All other components within normal limits  URINE MICROSCOPIC-ADD ON - Abnormal; Notable for the following:    Squamous Epithelial / LPF 0-5 (*)    Bacteria, UA RARE (*)    Casts HYALINE CASTS (*)    All other components within normal limits    Imaging Review Dg Chest 2 View  08/03/2015  CLINICAL DATA:  Unwitnessed fall at nursing home this morning. Generalized body aches. EXAM: CHEST  2 VIEW 04/12/2014: 04/12/2014 FINDINGS: AP and lateral views of the chest show hyperexpansion. The lungs are clear wiithout focal pneumonia, edema, pneumothorax or pleural effusion. The cardiopericardial silhouette is within normal limits for size. The visualized bony structures of the thorax are intact. IMPRESSION: Hyperinflation without acute findings. Electronically Signed   By: Misty Stanley M.D.   On: 08/03/2015 08:28   Dg Thoracic Spine 2 View  08/03/2015  CLINICAL DATA:  Unwitnessed fall at nursing home  earlier today. EXAM: THORACIC SPINE 2 VIEWS COMPARISON:  None. FINDINGS: No evidence for fracture. Intervertebral disc spaces are preserved. Frontal film shows no abnormal paraspinal line. IMPRESSION: Negative. Electronically Signed   By: Misty Stanley M.D.   On: 08/03/2015 08:29   Dg Lumbar Spine 2-3 Views  08/03/2015  CLINICAL DATA:  Fall at nursing home today EXAM: LUMBAR SPINE - 2-3 VIEW COMPARISON:  None. FINDINGS: There is no evidence of lumbar spine fracture. Alignment is normal. Intervertebral disc spaces are maintained. IMPRESSION: Negative. Electronically Signed   By: Misty Stanley M.D.   On: 08/03/2015 08:35   Dg Pelvis 1-2 Views  08/03/2015  CLINICAL DATA:  Fall at nursing today. EXAM: PELVIS - 1-2 VIEW COMPARISON:  None. FINDINGS: There is no evidence of pelvic fracture or diastasis. No pelvic bone lesions are seen. IMPRESSION: Negative. Electronically Signed   By: Misty Stanley M.D.   On: 08/03/2015 08:35   Ct Head Wo Contrast  08/04/2015  CLINICAL DATA:  Fall, scalp laceration EXAM: CT HEAD WITHOUT CONTRAST CT CERVICAL SPINE WITHOUT CONTRAST TECHNIQUE: Multidetector CT imaging of the head and cervical spine was performed following the standard protocol without intravenous contrast. Multiplanar CT image reconstructions of the cervical spine were also generated. COMPARISON:  08/03/2015 FINDINGS: CT HEAD FINDINGS Posterior scalp swelling. No underlying acute calvarial abnormality. No acute intracranial abnormality. Specifically, no hemorrhage, hydrocephalus, mass lesion, acute infarction, or significant intracranial injury. No acute calvarial abnormality. CT CERVICAL SPINE FINDINGS Diffuse degenerative spurring anteriorly throughout the cervical spine. Prevertebral soft tissues and alignment are normal. No fracture. Alignment is normal. IMPRESSION: No acute intracranial abnormality. No acute bony abnormality in the cervical spine. Electronically Signed   By: Rolm Baptise M.D.   On: 08/04/2015 10:27    Ct Head Wo Contrast  08/03/2015  CLINICAL DATA:  82 year old female with acute head CT neck injury following fall today. Initial encounter. EXAM: CT HEAD WITHOUT CONTRAST CT CERVICAL SPINE WITHOUT CONTRAST TECHNIQUE: Multidetector CT imaging of the head and cervical spine was performed following the standard protocol without intravenous contrast. Multiplanar CT image reconstructions of the cervical spine were also generated. COMPARISON:  07/28/2015 and prior exams. FINDINGS: CT HEAD FINDINGS Mild atrophy and chronic small-vessel white matter ischemic changes are again noted. No acute intracranial abnormalities are identified, including mass lesion or mass effect, hydrocephalus, extra-axial fluid collection, midline shift, hemorrhage, or acute infarction. The visualized bony calvarium is unremarkable. CT  CERVICAL SPINE FINDINGS Normal alignment is again noted. There is no evidence of acute fracture, subluxation or prevertebral soft tissue swelling. Anterior osteophytosis/DISH again noted. Mild facet arthropathy within the cervical spine is unchanged. No focal bony lesions identified. The soft tissue structures and lung apices are unremarkable. IMPRESSION: No evidence of acute intracranial or cervical spine abnormality. Mild cerebral atrophy and chronic small-vessel white matter ischemic changes. Electronically Signed   By: Margarette Canada M.D.   On: 08/03/2015 08:25   Ct Cervical Spine Wo Contrast  08/04/2015  CLINICAL DATA:  Fall, scalp laceration EXAM: CT HEAD WITHOUT CONTRAST CT CERVICAL SPINE WITHOUT CONTRAST TECHNIQUE: Multidetector CT imaging of the head and cervical spine was performed following the standard protocol without intravenous contrast. Multiplanar CT image reconstructions of the cervical spine were also generated. COMPARISON:  08/03/2015 FINDINGS: CT HEAD FINDINGS Posterior scalp swelling. No underlying acute calvarial abnormality. No acute intracranial abnormality. Specifically, no hemorrhage,  hydrocephalus, mass lesion, acute infarction, or significant intracranial injury. No acute calvarial abnormality. CT CERVICAL SPINE FINDINGS Diffuse degenerative spurring anteriorly throughout the cervical spine. Prevertebral soft tissues and alignment are normal. No fracture. Alignment is normal. IMPRESSION: No acute intracranial abnormality. No acute bony abnormality in the cervical spine. Electronically Signed   By: Rolm Baptise M.D.   On: 08/04/2015 10:27   Ct Cervical Spine Wo Contrast  08/03/2015  CLINICAL DATA:  82 year old female with acute head CT neck injury following fall today. Initial encounter. EXAM: CT HEAD WITHOUT CONTRAST CT CERVICAL SPINE WITHOUT CONTRAST TECHNIQUE: Multidetector CT imaging of the head and cervical spine was performed following the standard protocol without intravenous contrast. Multiplanar CT image reconstructions of the cervical spine were also generated. COMPARISON:  07/28/2015 and prior exams. FINDINGS: CT HEAD FINDINGS Mild atrophy and chronic small-vessel white matter ischemic changes are again noted. No acute intracranial abnormalities are identified, including mass lesion or mass effect, hydrocephalus, extra-axial fluid collection, midline shift, hemorrhage, or acute infarction. The visualized bony calvarium is unremarkable. CT CERVICAL SPINE FINDINGS Normal alignment is again noted. There is no evidence of acute fracture, subluxation or prevertebral soft tissue swelling. Anterior osteophytosis/DISH again noted. Mild facet arthropathy within the cervical spine is unchanged. No focal bony lesions identified. The soft tissue structures and lung apices are unremarkable. IMPRESSION: No evidence of acute intracranial or cervical spine abnormality. Mild cerebral atrophy and chronic small-vessel white matter ischemic changes. Electronically Signed   By: Margarette Canada M.D.   On: 08/03/2015 08:25   I have personally reviewed and evaluated these images and lab results as part of my  medical decision-making.   MDM   Final diagnoses:  None   The patient received 5 mg of Haldol at 8:30 and did help calm the patient.   Patient received a second second dose of Haldol and she did calm down after this.  The patient is here and feel that it is not safe for her to go back to her facility.  The patient will need further evaluation by [facility] psych and for possible placement in another facility.  Her facility is currently working on placement.    Dalia Heading, PA-C 08/04/15 [place], MD 08/04/15 [phone]

## 2015-08-04 NOTE — Progress Notes (Signed)
CM reached [facility] staff, [facility] MD office # as [phone] [place], [place] for Dr Sande Brothers sees pt "once a week"

## 2015-08-05 DIAGNOSIS — G300 Alzheimer's disease with early onset: Secondary | ICD-10-CM

## 2015-08-05 DIAGNOSIS — G309 Alzheimer's disease, unspecified: Secondary | ICD-10-CM

## 2015-08-05 DIAGNOSIS — G308 Other Alzheimer's disease: Secondary | ICD-10-CM

## 2015-08-05 DIAGNOSIS — Z043 Encounter for examination and observation following other accident: Secondary | ICD-10-CM | POA: Diagnosis not present

## 2015-08-05 DIAGNOSIS — F0281 Dementia in other diseases classified elsewhere with behavioral disturbance: Secondary | ICD-10-CM | POA: Insufficient documentation

## 2015-08-05 LAB — CBG MONITORING, ED
GLUCOSE-CAPILLARY: 108 mg/dL — AB (ref 65–99)
GLUCOSE-CAPILLARY: 116 mg/dL — AB (ref 65–99)

## 2015-08-05 MED ORDER — LORAZEPAM 0.5 MG PO TABS: 0.5000 mg | ORAL_TABLET | Freq: Two times a day (BID) | ORAL | Status: DC

## 2015-08-05 MED ORDER — ALPRAZOLAM 0.5 MG PO TABS
1.0000 mg | ORAL_TABLET | Freq: Once | ORAL | Status: AC
  Administered 2015-08-05: 1 mg via ORAL
  Filled 2015-08-05: qty 2

## 2015-08-05 MED ORDER — VENLAFAXINE HCL 75 MG PO TABS
75.0000 mg | ORAL_TABLET | Freq: Two times a day (BID) | ORAL | Status: DC
  Filled 2015-08-05 (×3): qty 1

## 2015-08-05 MED ORDER — VENLAFAXINE HCL 75 MG PO TABS: 75.0000 mg | ORAL_TABLET | Freq: Two times a day (BID) | ORAL | Status: DC

## 2015-08-05 MED ORDER — GABAPENTIN 100 MG PO CAPS: 200.0000 mg | ORAL_CAPSULE | Freq: Two times a day (BID) | ORAL | Status: DC

## 2015-08-05 NOTE — ED Notes (Signed)
1 bag belonging with patient at discharge

## 2015-08-05 NOTE — Consult Note (Signed)
[facility] Face-to-Face Psychiatry Consult   Reason for Consult:  Agitation, Dementia with behavior behavioral disturbance Referring Physician:  EDP Patient Identification: Kristina [name] MRN:  [id number] Principal Diagnosis: Dementia of Alzheimer's type with behavioral disturbance Diagnosis:   Patient Active Problem List   Diagnosis Date Noted  . Dementia of Alzheimer's type with behavioral disturbance [G30.8] 08/05/2015    Priority: Medium  . Dementia in Alzheimer's disease with early onset with behavioral disturbance [G30.0, F02.81] 08/05/2015  . Alzheimer's dementia [G30.9, F02.80] 11/14/2014  . Depression [F32.9] 11/14/2014  . Pure hypercholesterolemia [E78.00] 03/01/2013  . DVT, lower extremity ([place]) [I82.409] 10/19/2012  . Dyspnea [R06.00] 10/15/2012  . Type II or unspecified type diabetes mellitus without mention of complication, uncontrolled [E11.65] 10/15/2012  . HTN (hypertension) [I10] 10/15/2012  . PE (pulmonary embolism) [I26.99] 10/15/2012  . CKD (chronic kidney disease) stage 3, GFR 30-59 ml/min [N18.3] 10/15/2012    Total Time spent with patient: 45 minutes  Subjective:   Kristina [name] is a 82 y.o. female patient admitted with Agitation, Dementia with behavior behavioral disturbance   HPI:  AA female, 82 years old with a hx of Dementia was evaluated today.  Patient is unable to participate in the interview due to dementia symptoms.  Patient's husband was in the room and answered most of the questions.  Patient is living at [facility].  She was brought in from there for falling down.  According to the staff at the center patient does have frequent falls.  Patient is oriented to her name and her husbands name.  She is oriented to self and slowly stated that she is at  [facility].  Patient's husband is looking for upper level placement for patient due to frequent falls.   Patient is discharged back to the Memory care facility.  His family want her  to move to a facility with better monitoring capability but the waiting period is 30 days.  Patient is discharged back to her former facility.    Past Psychiatric History:  Dementia, Depression  Risk to Self: Suicidal Ideation: No Suicidal Intent: No Is patient at risk for suicide?: No Suicidal Plan?: No Access to Means: No What has been your use of drugs/alcohol within the last 12 months?:  (None reported) How many times?:  (0) Other Self Harm Risks:  (Hx of wandering and running away) Triggers for Past Attempts:  (n/a) Intentional Self Injurious Behavior: None Risk to Others: Homicidal Ideation: No Thoughts of Harm to Others: No Current Homicidal Intent: No Current Homicidal Plan: No Access to Homicidal Means: No Identified Victim:  (n/a) History of harm to others?: Yes Assessment of Violence: On admission Violent Behavior Description:  (Hx of aggression and outburst ) Does patient have access to weapons?: No Criminal Charges Pending?: No Does patient have a court date: No Prior Inpatient Therapy: Prior Inpatient Therapy: Yes Prior Therapy Dates: 2016 (2016 - Admitted to [facility] for 80 days) Prior Therapy Facilty/Provider(s): [place] Reason for Treatment: Aggression Prior Outpatient Therapy: Prior Outpatient Therapy: No Prior Therapy Dates: na Prior Therapy Facilty/Provider(s): na Reason for Treatment: na Does patient have an ACCT team?: No Does patient have Intensive In-House Services?  : No Does patient have [facility] services? : No Does patient have P4CC services?: No  Past Medical History:  Past Medical History  Diagnosis Date  . Diabetes mellitus   . Hypertension   . Renal disorder   . Cancer ([place]) 2000    sp bilateral  masectomy, sp chemo  . DVT (deep venous thrombosis) ([place])   . PE (pulmonary embolism)   . Dementia   . GERD (gastroesophageal reflux disease)   . Dehydration   . Hypokalemia   . GERD (gastroesophageal reflux disease)   .  Fracture of ramus of mandible ([place])   . Hyperlipidemia   . Alzheimer disease   . Chronic kidney disease (CKD), stage III (moderate)     Past Surgical History  Procedure Laterality Date  . Breast surgery    . Mastecomy     Family History:  Family History  Problem Relation Age of Onset  . Hypertension Mother   . Diabetes Mellitus II Father    Family Psychiatric  History:  Unable to obtain. Social History:  History  Alcohol Use No     History  Drug Use No    Social History   Social History  . Marital Status: Married    Spouse Name: N/A  . Number of Children: N/A  . Years of Education: N/A   Social History Main Topics  . Smoking status: Never Smoker   . Smokeless tobacco: None  . Alcohol Use: No  . Drug Use: No  . Sexual Activity: Not Asked   Other Topics Concern  . None   Social History Narrative   Additional Social History:    Pain Medications: SEE MAR Prescriptions: SEE MAR Over the Counter: SEE MAR History of alcohol / drug use?: No history of alcohol / drug abuse   Allergies:   Allergies  Allergen Reactions  . Cortisone Other (See Comments)    Injection-altered mental status, didn't know her name, where she lived, etc.     . Oxycodone Other (See Comments)    Caused patient to go crazy  . Penicillins Rash    Per MAR     Labs:  Results for orders placed or performed during the hospital encounter of 08/04/15 (from the past 48 hour(s))  Valproic acid level     Status: Abnormal   Collection Time: 08/04/15  9:56 AM  Result Value Ref Range   Valproic Acid Lvl 15 (L) 50.0 - 100.0 ug/mL  Urinalysis, Routine w reflex microscopic     Status: Abnormal   Collection Time: 08/04/15  1:20 PM  Result Value Ref Range   Color, Urine YELLOW YELLOW   APPearance CLEAR CLEAR   Specific Gravity, Urine 1.014 1.005 - 1.030   pH 6.5 5.0 - 8.0   Glucose, UA NEGATIVE NEGATIVE mg/dL   Hgb urine dipstick TRACE (A) NEGATIVE   Bilirubin Urine NEGATIVE NEGATIVE    Ketones, ur NEGATIVE NEGATIVE mg/dL   Protein, ur 30 (A) NEGATIVE mg/dL   Nitrite NEGATIVE NEGATIVE   Leukocytes, UA NEGATIVE NEGATIVE  Urine microscopic-add on     Status: Abnormal   Collection Time: 08/04/15  1:20 PM  Result Value Ref Range   Squamous Epithelial / LPF 0-5 (A) NONE SEEN   WBC, UA 0-5 0 - 5 WBC/hpf   RBC / HPF 0-5 0 - 5 RBC/hpf   Bacteria, UA RARE (A) NONE SEEN   Casts HYALINE CASTS (A) NEGATIVE  CBG monitoring, ED     Status: Abnormal   Collection Time: 08/04/15 10:31 PM  Result Value Ref Range   Glucose-Capillary 208 (H) 65 - 99 mg/dL  CBG monitoring, ED     Status: Abnormal   Collection Time: 08/05/15  4:21 AM  Result Value Ref Range   Glucose-Capillary 108 (H) 65 - 99 mg/dL  Comment 1 Notify RN   CBG monitoring, ED     Status: Abnormal   Collection Time: 08/05/15  8:45 AM  Result Value Ref Range   Glucose-Capillary 116 (H) 65 - 99 mg/dL   Comment 1 Notify RN    Comment 2 Document in Chart     Current Facility-Administered Medications  Medication Dose Route Frequency Provider Last Rate Last Dose  . alum & mag hydroxide-simeth (MAALOX/MYLANTA) 200-200-20 MG/5ML suspension 30 mL  30 mL Oral Q6H PRN Dalia Heading, PA-C      . calcitRIOL (ROCALTROL) capsule 0.25 mcg  0.25 mcg Oral Daily Dalia Heading, PA-C   0.25 mcg at 08/04/15 2019  . ferrous sulfate tablet 325 mg  325 mg Oral BID WC Dalia Heading, PA-C   325 mg at 08/04/15 2024  . gabapentin (NEURONTIN) capsule 200 mg  200 mg Oral BID Delinda Malan      . guaifenesin (ROBITUSSIN) 100 MG/5ML syrup 200 mg  200 mg Oral QID PRN Dalia Heading, PA-C      . insulin aspart (novoLOG) injection 2-6 Units  2-6 Units Subcutaneous QID PRN Dalia Heading, PA-C   4 Units at 08/04/15 2241  . irbesartan (AVAPRO) tablet 37.5 mg  37.5 mg Oral Daily Dalia Heading, PA-C   37.5 mg at 08/04/15 2017  . loperamide (IMODIUM) capsule 2 mg  2 mg Oral Daily PRN Dalia Heading, PA-C      . LORazepam  (ATIVAN) tablet 0.5 mg  0.5 mg Oral BID Talis Iwan      . memantine (NAMENDA) tablet 10 mg  10 mg Oral BID Dalia Heading, PA-C   10 mg at 08/04/15 2134  . pantoprazole (PROTONIX) EC tablet 40 mg  40 mg Oral Daily Dalia Heading, PA-C   40 mg at 08/04/15 2026  . rosuvastatin (CRESTOR) tablet 20 mg  20 mg Oral Daily Dalia Heading, PA-C   20 mg at 08/05/15 0910  . thiothixene (NAVANE) capsule 2 mg  2 mg Oral BID Dalia Heading, PA-C   2 mg at 08/04/15 2026  . venlafaxine [facility]) tablet 75 mg  75 mg Oral BID WC Kimari Coudriet   75 mg at 08/05/15 1141  . vitamin B-12 (CYANOCOBALAMIN) tablet 1,000 mcg  1,000 mcg Oral Daily Dalia Heading, PA-C   1,000 mcg at 08/05/15 0911   Current Outpatient Prescriptions  Medication Sig Dispense Refill  . acetaminophen (TYLENOL) 325 MG tablet Take 325 mg by mouth 3 (three) times daily.    Marland Kitchen acetaminophen (TYLENOL) 500 MG tablet Take 500 mg by mouth every 4 (four) hours as needed for fever.    . ALPRAZolam (XANAX) 0.25 MG tablet Take 0.25 mg by mouth 2 (two) times daily.    Marland Kitchen ALPRAZolam (XANAX) 0.5 MG tablet Take 0.5 mg by mouth at bedtime.     . ALPRAZolam (XANAX) 0.5 MG tablet Take 0.5 mg by mouth 4 (four) times daily as needed for anxiety (control).    Marland Kitchen alum & mag hydroxide-simeth (MAALOX/MYLANTA) 200-200-20 MG/5ML suspension Take 30 mLs by mouth every 6 (six) hours as needed for indigestion or heartburn.    . calcitRIOL (ROCALTROL) 0.25 MCG capsule Take 1 capsule (0.25 mcg total) by mouth daily. 30 capsule 0  . candesartan (ATACAND) 4 MG tablet Take 4 mg by mouth daily.    . divalproex (DEPAKOTE SPRINKLE) 125 MG capsule Take 125-250 mg by mouth 2 (two) times daily. Take 125mg  in the am and 250mg  at night  10  . ferrous sulfate 325 (65  FE) MG tablet Take 325 mg by mouth 2 (two) times daily.    Marland Kitchen gabapentin (NEURONTIN) 300 MG capsule Take 1 capsule (300 mg total) by mouth at bedtime. 30 capsule 0  . guaifenesin (ROBITUSSIN) 100 MG/5ML  syrup Take 200 mg by mouth 4 (four) times daily as needed for cough.    . insulin aspart (NOVOLOG) 100 UNIT/ML injection Inject 2-6 Units into the skin 4 (four) times daily as needed for high blood sugar. Blood glucose 150-200=2 units, 201-250=4 units, 251-300=6units    . loperamide (IMODIUM) 2 MG capsule Take 2 mg by mouth daily as needed for diarrhea or loose stools.    . Lorazepam POWD Apply 1 application topically every 8 (eight) hours as needed (apply to wrist  for agitation/anxiety). Marland KitchenR-Lorazepam PLO Gel 0.5mg /mL gel  Or Loazepam [place] p.5mg /o.25mL    . magnesium hydroxide (MILK OF MAGNESIA) 400 MG/5ML suspension Take 30 mLs by mouth daily as needed for mild constipation.    . memantine (NAMENDA) 10 MG tablet Take 1 tablet (10 mg total) by mouth 2 (two) times daily. 30 tablet 0  . Multiple Vitamin (MULTIVITAMIN) tablet Take 1 tablet by mouth daily. 30 tablet 0  . neomycin-bacitracin-polymyxin (NEOSPORIN) [phone] ointment Apply 1 application topically daily as needed (skin tears).    . pantoprazole (PROTONIX) 40 MG tablet Take 40 mg by mouth daily.    . potassium chloride SA (K-DUR,KLOR-CON) 20 MEQ tablet Take 20 mEq by mouth daily.  10  . rosuvastatin (CRESTOR) 20 MG tablet Take 1 tablet (20 mg total) by mouth daily. 30 tablet 0  . thiothixene (NAVANE) 1 MG capsule Take 2 mg by mouth 2 (two) times daily.     Marland Kitchen venlafaxine (EFFEXOR) 50 MG tablet Take 50 mg by mouth 3 (three) times daily with meals.    . vitamin B-12 (CYANOCOBALAMIN) 1000 MCG tablet Take 1 tablet (1,000 mcg total) by mouth daily. 30 tablet 0  . gabapentin (NEURONTIN) 100 MG capsule Take 2 capsules (200 mg total) by mouth 2 (two) times daily. 120 capsule 0  . insulin lispro protamine-lispro (HUMALOG 75/25 MIX) (75-25) 100 UNIT/ML SUSP injection Inject 12-14 Units into the skin 2 (two) times daily. Uses 14 units in the AM and 12 units at supper (Patient not taking: Reported on 07/21/2015) 10 mL 0  . LORazepam (ATIVAN) 0.5 MG  tablet Take 1 tablet (0.5 mg total) by mouth 2 (two) times daily. 60 tablet 0    Musculoskeletal: Strength & Muscle Tone: sitting in bed Gait & Station: sitting in bed Patient leans: sitting in bed   Psychiatric Specialty Exam: Review of Systems  Unable to perform ROS: dementia    Blood pressure 105/86, pulse 95, temperature 98.2 F (36.8 C), temperature source Oral, resp. rate 16, SpO2 100 %.There is no weight on file to calculate BMI.  General Appearance: Casual  Eye Contact::  Minimal  Speech:  Garbled and Slow  Volume:  Decreased  Mood:  Anxious  Affect:  Congruent  Thought Process:  Linear  Orientation:  Other:  oriented to name and place  Thought Content:  unable to obtain secondary to Dementia  Suicidal Thoughts:  unable to obtain  Homicidal Thoughts:  unable to obtain  Memory:  Immediate;   Poor Recent;   Poor Remote;   Poor  Judgement:  Poor  Insight:  Lacking and secondary to dementia  Psychomotor Activity:  Psychomotor Retardation  Concentration:  Poor  Recall:  Poor  Fund of Knowledge:Poor  Language: Poor  Akathisia:  No  Handed:  Right  AIMS (if indicated):     Assets:  Others:  unable to obtain  ADL's:  Intact  Cognition: Impaired,  Severe  Sleep:       Disposition:  Discharge back to Memory care center.  Stop taking Depakote and Xanax and start Gabapentin 200 mg po bid for anxiety, Ativan 0.5 mg po bid for agitation and Venlafaxine has been changed to 75 mg po bid for depression.  Delfin Gant   PMHNP-BC 08/05/2015 2:03 PM Patient seen face-to-face for psychiatric evaluation, chart reviewed and case discussed with the physician extender and developed treatment plan. Reviewed the information documented and agree with the treatment plan. Corena Pilgrim, MD

## 2015-08-05 NOTE — ED Notes (Addendum)
Pt continues to be restless 2am - 4:50am with trying to get out of bed and removing clothes and blanket covers. Bed alarm triggered x 4. Eyes remained closed entire time. She does not follow direction. Eyes do not open with painful stimuli but pt withdraws. No verbal response.   Glucose checked : 108mg /dl Resp rate : 14/min  O2 sat : 95% HR: 82 and irregular  Bed in low position, bed alarm in place. Charge nurse aware of safety concerns if patient unsupervised.

## 2015-08-05 NOTE — Progress Notes (Signed)
11:46a- CSW called to speak with [name], [place], [facility] [phone]. CSW was informed that Manager was at lunch at this time.   12:55p- CSW spoke with [name], [facility], [place]. CSW informed Manager that patient would be returned to their facility once she wakes up.    CSW asked if her agency was working on a higher level of care. Manager stated they are working on a higher level of care and her facility already has a recommendation for skilled care.  Genice Rouge [id number] ED CSW 08/05/2015 1:01 PM

## 2015-08-05 NOTE — Progress Notes (Signed)
ED CM and ED SW reviewed pt information since 08/04/15 Rulon Eisenmenger NP with Dr Sande Brothers at [facility] already aware of need for higher level of care for pt and informed CM she was already working on this Refer to ED CM notes from 08/04/15  Discuss call from dtr to TCU RN about [facility] neurological snf  ED PM SW spoke with husband about PDN, No money TTS spoke with daughter No money for private duty Referred to SW leadership to see if further interventions 1010 ED CM spoke with [place] to update him  ED SW aware

## 2015-08-05 NOTE — ED Notes (Addendum)
Pt is alert to place, name and is responding appropriately.  Not oriented to time.  Pt continues to leave eyes closed although verbalizing to questioning.  Attempted to give drink/meal.  Pt refusing even with moderate assist.  Unable to give meds

## 2015-08-05 NOTE — ED Notes (Signed)
Pt is awake and eating.  Pt will transfer back to facility.

## 2015-08-05 NOTE — Progress Notes (Addendum)
11:03a- CSW staffed case with Nurse CM. Nurse CM staffed with EDP and EDP is agreeable for patient being discharged. Patient has been cleared by psychiatry.   11:06a- CSW staffed case with Asst. Director for Social Work. CSW was informed to call [place] to inform their agency that patient would be discharged and returning on today.   11:09a- CSW spoke with [name], [facility] at [facility] to inform her that patient was cleared for discharge and patient would be returning to their facility on today. Mrs. Hall Busing stated she wanted CSW to fax any new orders and any information pertaining to patient's current stay. Fax# [phone].  CSW staffed with nurse. Nurse stated patient was lethargic and she had not taken medications this AM and patient has not eaten or drank all day. Nurse stated patient did not sleep all night.  CSW staffed with Psychiatrist and NP. Per Psychiatrist and NP, patient should not be discharged until she wakes up today.   CSW spoke with patient's daughter, Enis Slipper at [phone] to inform her that patient would be discharged back to [facility] on today. CSW also spoke with patient's husband at bedside to inform him that patient would be discharged back to [facility] on today after she wakes up. Patient's husband had no questions for CSW at this time.  Genice Rouge [id number] ED CSW 08/05/2015 12:49 PM

## 2015-08-16 ENCOUNTER — Emergency Department (HOSPITAL_COMMUNITY): Payer: Medicare Other

## 2015-08-16 ENCOUNTER — Encounter (HOSPITAL_COMMUNITY): Payer: Self-pay

## 2015-08-16 ENCOUNTER — Emergency Department (HOSPITAL_COMMUNITY)
Admission: EM | Admit: 2015-08-16 | Discharge: 2015-08-16 | Disposition: A | Discharge status: Home / Self Care | Payer: Medicare Other | Attending: Emergency Medicine | Admitting: Emergency Medicine

## 2015-08-16 DIAGNOSIS — G309 Alzheimer's disease, unspecified: Secondary | ICD-10-CM | POA: Diagnosis not present

## 2015-08-16 DIAGNOSIS — E119 Type 2 diabetes mellitus without complications: Secondary | ICD-10-CM | POA: Insufficient documentation

## 2015-08-16 DIAGNOSIS — Z880 Allergy status to penicillin: Secondary | ICD-10-CM | POA: Diagnosis not present

## 2015-08-16 DIAGNOSIS — K219 Gastro-esophageal reflux disease without esophagitis: Secondary | ICD-10-CM | POA: Diagnosis not present

## 2015-08-16 DIAGNOSIS — W1839XD Other fall on same level, subsequent encounter: Secondary | ICD-10-CM | POA: Diagnosis not present

## 2015-08-16 DIAGNOSIS — I129 Hypertensive chronic kidney disease with stage 1 through stage 4 chronic kidney disease, or unspecified chronic kidney disease: Secondary | ICD-10-CM | POA: Insufficient documentation

## 2015-08-16 DIAGNOSIS — Z79899 Other long term (current) drug therapy: Secondary | ICD-10-CM | POA: Insufficient documentation

## 2015-08-16 DIAGNOSIS — S0100XD Unspecified open wound of scalp, subsequent encounter: Secondary | ICD-10-CM | POA: Insufficient documentation

## 2015-08-16 DIAGNOSIS — Z794 Long term (current) use of insulin: Secondary | ICD-10-CM | POA: Diagnosis not present

## 2015-08-16 DIAGNOSIS — N183 Chronic kidney disease, stage 3 (moderate): Secondary | ICD-10-CM | POA: Insufficient documentation

## 2015-08-16 DIAGNOSIS — Z86718 Personal history of other venous thrombosis and embolism: Secondary | ICD-10-CM | POA: Diagnosis not present

## 2015-08-16 DIAGNOSIS — E785 Hyperlipidemia, unspecified: Secondary | ICD-10-CM | POA: Insufficient documentation

## 2015-08-16 DIAGNOSIS — F0280 Dementia in other diseases classified elsewhere without behavioral disturbance: Secondary | ICD-10-CM | POA: Insufficient documentation

## 2015-08-16 DIAGNOSIS — Z8781 Personal history of (healed) traumatic fracture: Secondary | ICD-10-CM | POA: Diagnosis not present

## 2015-08-16 DIAGNOSIS — Z86711 Personal history of pulmonary embolism: Secondary | ICD-10-CM | POA: Diagnosis not present

## 2015-08-16 DIAGNOSIS — R5383 Other fatigue: Secondary | ICD-10-CM | POA: Diagnosis not present

## 2015-08-16 DIAGNOSIS — W19XXXD Unspecified fall, subsequent encounter: Secondary | ICD-10-CM

## 2015-08-16 NOTE — ED Notes (Signed)
Patient arrives by EMS with complaints of fall from [facility].  Patient has dementia and has frequent falls, per EMS no obvious injuries.  EMS states found patient sitting on floor of her bathroom.

## 2015-08-16 NOTE — ED Notes (Signed)
[facility] transporting pt back to [facility]

## 2015-08-16 NOTE — Discharge Instructions (Signed)
Xrays of pelvis and hips did not show acute fracture, and CT head was negative for acute changes.

## 2015-08-16 NOTE — ED Notes (Signed)
Bed: WA29 Expected date:  Expected time:  Means of arrival:  Comments: 

## 2015-08-16 NOTE — ED Notes (Signed)
Pt back from x-ray.

## 2015-08-16 NOTE — ED Notes (Signed)
Pt to xray

## 2015-08-16 NOTE — ED Notes (Signed)
Multiple phone calls to facility for report with no answer. Patient awaiting transport back to facility by [facility].

## 2015-08-16 NOTE — ED Provider Notes (Signed)
CSN: [id number]     Arrival date & time 08/16/15  0416 History   First MD Initiated Contact with Patient 08/16/15 0459     Chief Complaint  Patient presents with  . Fall   HPI   Kristina [name] is a 82 y.o. F PMH significant for DM, HTN, Alzheimer, CKD stage III BIB EMS from assisted-living facility presenting with complaints of fall. Unable to obtain further history.   Past Medical History  Diagnosis Date  . Diabetes mellitus   . Hypertension   . Renal disorder   . Cancer ([place] 2000    sp bilateral masectomy, sp chemo  . DVT (deep venous thrombosis) ([place])   . PE (pulmonary embolism)   . Dementia   . GERD (gastroesophageal reflux disease)   . Dehydration   . Hypokalemia   . GERD (gastroesophageal reflux disease)   . Fracture of ramus of mandible ([place])   . Hyperlipidemia   . Alzheimer disease   . Chronic kidney disease (CKD), stage III (moderate)    Past Surgical History  Procedure Laterality Date  . Breast surgery    . Mastecomy     Family History  Problem Relation Age of Onset  . Hypertension Mother   . Diabetes Mellitus II Father    Social History  Substance Use Topics  . Smoking status: Never Smoker   . Smokeless tobacco: None  . Alcohol Use: No   OB History    No data available     Review of Systems  Unable to perform ROS: Dementia    Allergies  Cortisone; Oxycodone; and Penicillins  Home Medications   Prior to Admission medications   Medication Sig Start Date End Date Taking? Authorizing Provider  acetaminophen (TYLENOL) 325 MG tablet Take 325 mg by mouth 3 (three) times daily.   Yes Historical Provider, MD  alum & mag hydroxide-simeth (MAALOX/MYLANTA) 200-200-20 MG/5ML suspension Take 30 mLs by mouth every 6 (six) hours as needed for indigestion or heartburn.   Yes Historical Provider, MD  calcitRIOL (ROCALTROL) 0.25 MCG capsule Take 1 capsule (0.25 mcg total) by mouth daily. 04/14/15  Yes Courteney Lyn Mackuen, MD  candesartan (ATACAND) 4 MG tablet  Take 4 mg by mouth daily. 07/16/15 07/15/16 Yes Historical Provider, MD  feeding supplement ([place]) LIQD Take 237 mLs by mouth 3 (three) times daily with meals.   Yes Historical Provider, MD  ferrous sulfate 325 (65 FE) MG tablet Take 325 mg by mouth 2 (two) times daily. 07/16/15 07/15/16 Yes Historical Provider, MD  gabapentin (NEURONTIN) 100 MG capsule Take 2 capsules (200 mg total) by mouth 2 (two) times daily. 08/05/15  Yes Delfin Gant, NP  guaifenesin (ROBITUSSIN) 100 MG/5ML syrup Take 200 mg by mouth 4 (four) times daily as needed for cough.   Yes Historical Provider, MD  insulin aspart (NOVOLOG) 100 UNIT/ML injection Inject 2-6 Units into the skin 4 (four) times daily as needed for high blood sugar. Blood glucose 150-200=2 units, 201-250=4 units, 251-300=6units   Yes Historical Provider, MD  loperamide (IMODIUM) 2 MG capsule Take 2 mg by mouth daily as needed for diarrhea or loose stools.   Yes Historical Provider, MD  LORazepam (ATIVAN) 0.5 MG tablet Take 1 tablet (0.5 mg total) by mouth 2 (two) times daily. Patient taking differently: Take 0.5 mg by mouth 2 (two) times daily. 2PM AND 8PM 08/05/15  Yes Delfin Gant, NP  magnesium hydroxide (MILK OF MAGNESIA) 400 MG/5ML suspension Take 30 mLs by mouth  daily as needed for mild constipation.   Yes Historical Provider, MD  memantine (NAMENDA) 10 MG tablet Take 1 tablet (10 mg total) by mouth 2 (two) times daily. 04/14/15  Yes Courteney Lyn Mackuen, MD  Multiple Vitamin (MULTIVITAMIN) tablet Take 1 tablet by mouth daily. 04/14/15  Yes Courteney Lyn Mackuen, MD  neomycin-bacitracin-polymyxin (NEOSPORIN) [phone] ointment Apply 1 application topically daily as needed (skin tears).   Yes Historical Provider, MD  pantoprazole (PROTONIX) 40 MG tablet Take 40 mg by mouth daily.   Yes Historical Provider, MD  potassium chloride SA (K-DUR,KLOR-CON) 20 MEQ tablet Take 20 mEq by mouth daily. 07/16/15  Yes Historical Provider, MD   rosuvastatin (CRESTOR) 20 MG tablet Take 1 tablet (20 mg total) by mouth daily. 04/14/15  Yes Courteney Lyn Mackuen, MD  thiothixene (NAVANE) 1 MG capsule Take 2 mg by mouth 2 (two) times daily.    Yes Historical Provider, MD  venlafaxine (EFFEXOR) 75 MG tablet Take 1 tablet (75 mg total) by mouth 2 (two) times daily with a meal. 08/05/15  Yes Delfin Gant, NP  vitamin B-12 (CYANOCOBALAMIN) 1000 MCG tablet Take 1 tablet (1,000 mcg total) by mouth daily. 04/14/15  Yes Courteney Lyn Mackuen, MD   BP 164/42 mmHg  Pulse 67  Temp(Src) 98.3 F (36.8 C) (Oral)  Resp 20  SpO2 100% Physical Exam  Constitutional: She appears well-developed and well-nourished. No distress.  Lethargic  HENT:  Head: Normocephalic.  Mouth/Throat: Oropharynx is clear and moist. No oropharyngeal exudate.  2 cm wound occipital head with surrounding granulation tissue. Bleeding controlled.   Eyes: Conjunctivae are normal. Pupils are equal, round, and reactive to light. Right eye exhibits no discharge. Left eye exhibits no discharge. No scleral icterus.  Neck: No tracheal deviation present.  Cardiovascular: Normal rate, regular rhythm, normal heart sounds and intact distal pulses.  Exam reveals no gallop and no friction rub.   No murmur heard. Pulmonary/Chest: Effort normal and breath sounds normal. No respiratory distress. She has no wheezes. She has no rales. She exhibits no tenderness.  Abdominal: Soft. Bowel sounds are normal. She exhibits no distension and no mass. There is no tenderness. There is no rebound and no guarding.  Musculoskeletal: She exhibits no edema.  Lymphadenopathy:    She has no cervical adenopathy.  Neurological: She is alert. Coordination normal.  Skin: Skin is warm and dry. No rash noted. She is not diaphoretic. No erythema.  Ecchymosis bilateral hips  Psychiatric: She has a normal mood and affect. Her behavior is normal.  Nursing note and vitals reviewed.   ED Course  Procedures   Imaging Review Ct Head Wo Contrast  08/16/2015  CLINICAL DATA:  Dementia and frequent falls with possible recent fall as found sitting on bathroom floor. EXAM: CT HEAD WITHOUT CONTRAST TECHNIQUE: Contiguous axial images were obtained from the base of the skull through the vertex without intravenous contrast. COMPARISON:  08/04/2015 and 07/28/2015 FINDINGS: Ventricles, cisterns and other CSF spaces are within normal. There is mild chronic ischemic microvascular disease. There is no mass, mass effect, shift of midline structures or acute hemorrhage. Mild bilateral basal ganglia calcifications are present. No evidence of acute infarction. Remaining bones and soft tissues are within normal. IMPRESSION: No acute intracranial findings. Chronic ischemic microvascular disease. Electronically Signed   By: Marin Olp M.D.   On: 08/16/2015 08:03   Dg Hips Bilat With Pelvis Min 5 Views  08/16/2015  CLINICAL DATA:  82 year old female with fall EXAM: DG HIP (WITH OR WITHOUT  PELVIS) 5+V BILAT COMPARISON:  Radiograph dated 04/19/2015 FINDINGS: There is no acute fracture or dislocation. There are chronic changes of the hips. There is moderate stool throughout the colon. A vascular calcifications noted. The soft tissues are unremarkable. IMPRESSION: No acute fracture or dislocation. Electronically Signed   By: Anner Crete M.D.   On: 08/16/2015 07:08   I have personally reviewed and evaluated these images and lab results as part of my medical decision-making.  MDM   Final diagnoses:  Fall, subsequent encounter  Alzheimer's dementia   Prior record review does not demonstrate ecchymosis and the occipital head wound. Although these appear to be healing, she has not been here since 1/4, so will CT head and xray pelvis.   Hips and pelvis xrays, and head CT, are negative for acute changes. Patient may be safely discharged. Patient to follow-up with primary care provider within one week.    Sabana Lions, PA-C 08/16/15 [id number]  Leo Grosser, MD 08/17/15 [phone]

## 2015-08-21 ENCOUNTER — Encounter (HOSPITAL_COMMUNITY): Payer: Self-pay | Admitting: Emergency Medicine

## 2015-08-21 ENCOUNTER — Inpatient Hospital Stay (HOSPITAL_COMMUNITY)
Admission: EM | Admit: 2015-08-21 | Discharge: 2015-08-31 | DRG: 056 | Disposition: A | Discharge status: Hospice | Payer: Medicare Other | Attending: Internal Medicine | Admitting: Internal Medicine

## 2015-08-21 DIAGNOSIS — R05 Cough: Secondary | ICD-10-CM

## 2015-08-21 DIAGNOSIS — R451 Restlessness and agitation: Secondary | ICD-10-CM | POA: Diagnosis present

## 2015-08-21 DIAGNOSIS — D72819 Decreased white blood cell count, unspecified: Secondary | ICD-10-CM | POA: Diagnosis present

## 2015-08-21 DIAGNOSIS — Z515 Encounter for palliative care: Secondary | ICD-10-CM | POA: Insufficient documentation

## 2015-08-21 DIAGNOSIS — N183 Chronic kidney disease, stage 3 unspecified: Secondary | ICD-10-CM | POA: Diagnosis present

## 2015-08-21 DIAGNOSIS — R109 Unspecified abdominal pain: Secondary | ICD-10-CM

## 2015-08-21 DIAGNOSIS — K219 Gastro-esophageal reflux disease without esophagitis: Secondary | ICD-10-CM | POA: Diagnosis present

## 2015-08-21 DIAGNOSIS — G9340 Encephalopathy, unspecified: Secondary | ICD-10-CM

## 2015-08-21 DIAGNOSIS — E1122 Type 2 diabetes mellitus with diabetic chronic kidney disease: Secondary | ICD-10-CM | POA: Diagnosis present

## 2015-08-21 DIAGNOSIS — E162 Hypoglycemia, unspecified: Secondary | ICD-10-CM | POA: Diagnosis not present

## 2015-08-21 DIAGNOSIS — Z79899 Other long term (current) drug therapy: Secondary | ICD-10-CM

## 2015-08-21 DIAGNOSIS — Z86711 Personal history of pulmonary embolism: Secondary | ICD-10-CM

## 2015-08-21 DIAGNOSIS — Z781 Physical restraint status: Secondary | ICD-10-CM

## 2015-08-21 DIAGNOSIS — Z681 Body mass index (BMI) 19 or less, adult: Secondary | ICD-10-CM

## 2015-08-21 DIAGNOSIS — Z8249 Family history of ischemic heart disease and other diseases of the circulatory system: Secondary | ICD-10-CM

## 2015-08-21 DIAGNOSIS — Z794 Long term (current) use of insulin: Secondary | ICD-10-CM

## 2015-08-21 DIAGNOSIS — I10 Essential (primary) hypertension: Secondary | ICD-10-CM | POA: Diagnosis present

## 2015-08-21 DIAGNOSIS — E440 Moderate protein-calorie malnutrition: Secondary | ICD-10-CM | POA: Insufficient documentation

## 2015-08-21 DIAGNOSIS — F02818 Dementia in other diseases classified elsewhere, unspecified severity, with other behavioral disturbance: Secondary | ICD-10-CM | POA: Diagnosis present

## 2015-08-21 DIAGNOSIS — G309 Alzheimer's disease, unspecified: Secondary | ICD-10-CM | POA: Diagnosis not present

## 2015-08-21 DIAGNOSIS — F0281 Dementia in other diseases classified elsewhere with behavioral disturbance: Secondary | ICD-10-CM | POA: Diagnosis present

## 2015-08-21 DIAGNOSIS — E785 Hyperlipidemia, unspecified: Secondary | ICD-10-CM | POA: Diagnosis present

## 2015-08-21 DIAGNOSIS — Z833 Family history of diabetes mellitus: Secondary | ICD-10-CM

## 2015-08-21 DIAGNOSIS — IMO0001 Reserved for inherently not codable concepts without codable children: Secondary | ICD-10-CM

## 2015-08-21 DIAGNOSIS — N289 Disorder of kidney and ureter, unspecified: Secondary | ICD-10-CM

## 2015-08-21 DIAGNOSIS — Z86718 Personal history of other venous thrombosis and embolism: Secondary | ICD-10-CM

## 2015-08-21 DIAGNOSIS — R059 Cough, unspecified: Secondary | ICD-10-CM

## 2015-08-21 DIAGNOSIS — Z853 Personal history of malignant neoplasm of breast: Secondary | ICD-10-CM

## 2015-08-21 DIAGNOSIS — R4182 Altered mental status, unspecified: Secondary | ICD-10-CM

## 2015-08-21 DIAGNOSIS — Z7189 Other specified counseling: Secondary | ICD-10-CM | POA: Insufficient documentation

## 2015-08-21 DIAGNOSIS — D649 Anemia, unspecified: Secondary | ICD-10-CM

## 2015-08-21 DIAGNOSIS — I129 Hypertensive chronic kidney disease with stage 1 through stage 4 chronic kidney disease, or unspecified chronic kidney disease: Secondary | ICD-10-CM | POA: Diagnosis present

## 2015-08-21 DIAGNOSIS — E119 Type 2 diabetes mellitus without complications: Secondary | ICD-10-CM

## 2015-08-21 DIAGNOSIS — Z66 Do not resuscitate: Secondary | ICD-10-CM | POA: Diagnosis present

## 2015-08-21 NOTE — ED Provider Notes (Signed)
CSN: [id number]     Arrival date & time 08/21/15  2328 History  By signing my name below, I, Irene Pap, attest that this documentation has been prepared under the direction and in the presence of Delora Fuel, MD. Electronically Signed: Irene Pap, ED Scribe. 08/21/2015. 1:46 AM.   Chief Complaint  Patient presents with  . Hypoglycemia   The history is provided by the EMS personnel. The history is limited by the condition of the patient. No language interpreter was used.  HPI Comments (Level 5 Caveat due to pt unresponsive): Kristina [name] is a 82 y.o. female with a hx of DM, HTN, renal disorder, cancer, DVT, PE, dementia, Alzheimer disease, hypokalemia, and CKD brought in by EMS from [facility] who presents to the Emergency Department complaining of decreased CBG onset PTA. Per EMS, CBG was 81. Pt was resistive to care measures and unwilling to respond to EMS. Unable to obtain further history due to pt not responding to verbal commands.   Past Medical History  Diagnosis Date  . Diabetes mellitus   . Hypertension   . Renal disorder   . Cancer ([place]) 2000    sp bilateral masectomy, sp chemo  . DVT (deep venous thrombosis) ([place])   . PE (pulmonary embolism)   . Dementia   . GERD (gastroesophageal reflux disease)   . Dehydration   . Hypokalemia   . GERD (gastroesophageal reflux disease)   . Fracture of ramus of mandible ([place])   . Hyperlipidemia   . Alzheimer disease   . Chronic kidney disease (CKD), stage III (moderate)    Past Surgical History  Procedure Laterality Date  . Breast surgery    . Mastecomy     Family History  Problem Relation Age of Onset  . Hypertension Mother   . Diabetes Mellitus II Father    Social History  Substance Use Topics  . Smoking status: Never Smoker   . Smokeless tobacco: Not on file  . Alcohol Use: No   OB History    No data available     Review of Systems  Unable to perform ROS: Patient unresponsive   Allergies  Cortisone;  Oxycodone; and Penicillins  Home Medications   Prior to Admission medications   Medication Sig Start Date End Date Taking? Authorizing Provider  acetaminophen (TYLENOL) 325 MG tablet Take 325 mg by mouth 3 (three) times daily.    Historical Provider, MD  alum & mag hydroxide-simeth (MAALOX/MYLANTA) 200-200-20 MG/5ML suspension Take 30 mLs by mouth every 6 (six) hours as needed for indigestion or heartburn.    Historical Provider, MD  calcitRIOL (ROCALTROL) 0.25 MCG capsule Take 1 capsule (0.25 mcg total) by mouth daily. 04/14/15   Courteney Lyn Mackuen, MD  candesartan (ATACAND) 4 MG tablet Take 4 mg by mouth daily. 07/16/15 07/15/16  Historical Provider, MD  feeding supplement ([place]) LIQD Take 237 mLs by mouth 3 (three) times daily with meals.    Historical Provider, MD  ferrous sulfate 325 (65 FE) MG tablet Take 325 mg by mouth 2 (two) times daily. 07/16/15 07/15/16  Historical Provider, MD  gabapentin (NEURONTIN) 100 MG capsule Take 2 capsules (200 mg total) by mouth 2 (two) times daily. 08/05/15   Delfin Gant, NP  guaifenesin (ROBITUSSIN) 100 MG/5ML syrup Take 200 mg by mouth 4 (four) times daily as needed for cough.    Historical Provider, MD  insulin aspart (NOVOLOG) 100 UNIT/ML injection Inject 2-6 Units into the skin 4 (four) times daily  as needed for high blood sugar. Blood glucose 150-200=2 units, 201-250=4 units, 251-300=6units    Historical Provider, MD  loperamide (IMODIUM) 2 MG capsule Take 2 mg by mouth daily as needed for diarrhea or loose stools.    Historical Provider, MD  LORazepam (ATIVAN) 0.5 MG tablet Take 1 tablet (0.5 mg total) by mouth 2 (two) times daily. Patient taking differently: Take 0.5 mg by mouth 2 (two) times daily. 2PM AND 8PM 08/05/15   Delfin Gant, NP  magnesium hydroxide (MILK OF MAGNESIA) 400 MG/5ML suspension Take 30 mLs by mouth daily as needed for mild constipation.    Historical Provider, MD  memantine (NAMENDA) 10 MG tablet Take  1 tablet (10 mg total) by mouth 2 (two) times daily. 04/14/15   Courteney Lyn Mackuen, MD  Multiple Vitamin (MULTIVITAMIN) tablet Take 1 tablet by mouth daily. 04/14/15   Courteney Lyn Mackuen, MD  neomycin-bacitracin-polymyxin (NEOSPORIN) [phone] ointment Apply 1 application topically daily as needed (skin tears).    Historical Provider, MD  pantoprazole (PROTONIX) 40 MG tablet Take 40 mg by mouth daily.    Historical Provider, MD  potassium chloride SA (K-DUR,KLOR-CON) 20 MEQ tablet Take 20 mEq by mouth daily. 07/16/15   Historical Provider, MD  rosuvastatin (CRESTOR) 20 MG tablet Take 1 tablet (20 mg total) by mouth daily. 04/14/15   Courteney Lyn Mackuen, MD  thiothixene (NAVANE) 1 MG capsule Take 2 mg by mouth 2 (two) times daily.     Historical Provider, MD  venlafaxine (EFFEXOR) 75 MG tablet Take 1 tablet (75 mg total) by mouth 2 (two) times daily with a meal. 08/05/15   Delfin Gant, NP  vitamin B-12 (CYANOCOBALAMIN) 1000 MCG tablet Take 1 tablet (1,000 mcg total) by mouth daily. 04/14/15   Courteney Lyn Mackuen, MD   BP 157/68 mmHg  Pulse 94  Temp(Src) 98.6 F (37 C)  Resp 18  SpO2 98% Physical Exam  Constitutional: She appears well-developed and well-nourished.  HENT:  Head: Normocephalic and atraumatic.  Eyes: Pupils are equal, round, and reactive to light.  Neck: Normal range of motion. Neck supple. No JVD present.  Cardiovascular: Normal rate, regular rhythm and normal heart sounds.  Exam reveals no gallop and no friction rub.   No murmur heard. Pulmonary/Chest: Effort normal and breath sounds normal. She has no wheezes. She has no rales. She exhibits no tenderness.  Abdominal: Soft. Bowel sounds are normal. She exhibits no distension and no mass. There is no tenderness.  Musculoskeletal: Normal range of motion. She exhibits no edema or tenderness.  Lymphadenopathy:    She has no cervical adenopathy.  Neurological: No cranial nerve deficit.  Responds to pain but not  voice; no purposeful movement; moderate spasticity of both arms and legs; no focal weakness  Skin: Skin is warm and dry. No rash noted.  Nursing note and vitals reviewed.   ED Course  Procedures (including critical care time) DIAGNOSTIC STUDIES: Oxygen Saturation is 98% on RA, normal by my interpretation.    COORDINATION OF CARE: 11:58 PM-labs and x-ray   Labs Review Results for orders placed or performed during the hospital encounter of 08/21/15  Comprehensive metabolic panel  Result Value Ref Range   Sodium 141 135 - 145 mmol/L   Potassium 4.8 3.5 - 5.1 mmol/L   Chloride 104 101 - 111 mmol/L   CO2 28 22 - 32 mmol/L   Glucose, Bld 136 (H) 65 - 99 mg/dL   BUN 30 (H) 6 - 20 mg/dL  Creatinine, Ser 1.49 (H) 0.44 - 1.00 mg/dL   Calcium 9.0 8.9 - 10.3 mg/dL   Total Protein 6.6 6.5 - 8.1 g/dL   Albumin 3.1 (L) 3.5 - 5.0 g/dL   AST 19 15 - 41 U/L   ALT 17 14 - 54 U/L   Alkaline Phosphatase 97 38 - 126 U/L   Total Bilirubin 0.6 0.3 - 1.2 mg/dL   GFR calc non Af Amer 32 (L) >60 mL/min   GFR calc Af Amer 37 (L) >60 mL/min   Anion gap 9 5 - 15  CBC with Differential  Result Value Ref Range   WBC 7.7 4.0 - 10.5 K/uL   RBC 2.83 (L) 3.87 - 5.11 MIL/uL   Hemoglobin 8.7 (L) 12.0 - 15.0 g/dL   HCT 27.9 (L) 36.0 - 46.0 %   MCV 98.6 78.0 - 100.0 fL   MCH 30.7 26.0 - 34.0 pg   MCHC 31.2 30.0 - 36.0 g/dL   RDW 14.5 11.5 - 15.5 %   Platelets 198 150 - 400 K/uL   Neutrophils Relative % 88 %   Neutro Abs 6.8 1.7 - 7.7 K/uL   Lymphocytes Relative 4 %   Lymphs Abs 0.3 (L) 0.7 - 4.0 K/uL   Monocytes Relative 8 %   Monocytes Absolute 0.7 0.1 - 1.0 K/uL   Eosinophils Relative 0 %   Eosinophils Absolute 0.0 0.0 - 0.7 K/uL   Basophils Relative 0 %   Basophils Absolute 0.0 0.0 - 0.1 K/uL  Urinalysis, Routine w reflex microscopic  Result Value Ref Range   Color, Urine YELLOW YELLOW   APPearance CLEAR CLEAR   Specific Gravity, Urine 1.018 1.005 - 1.030   pH 6.5 5.0 - 8.0   Glucose, UA  100 (A) NEGATIVE mg/dL   Hgb urine dipstick NEGATIVE NEGATIVE   Bilirubin Urine NEGATIVE NEGATIVE   Ketones, ur NEGATIVE NEGATIVE mg/dL   Protein, ur NEGATIVE NEGATIVE mg/dL   Nitrite NEGATIVE NEGATIVE   Leukocytes, UA NEGATIVE NEGATIVE  Ammonia  Result Value Ref Range   Ammonia 11 9 - 35 umol/L  I-Stat CG4 Lactic Acid, ED  Result Value Ref Range   Lactic Acid, Venous 0.91 0.5 - 2.0 mmol/L  POC occult blood, ED Provider will collect  Result Value Ref Range   Fecal Occult Bld NEGATIVE NEGATIVE    Imaging Review Ct Head Wo Contrast  08/22/2015  CLINICAL DATA:  Acute onset of altered mental status. Initial encounter. EXAM: CT HEAD WITHOUT CONTRAST TECHNIQUE: Contiguous axial images were obtained from the base of the skull through the vertex without intravenous contrast. COMPARISON:  CT of the head performed 08/16/2015 FINDINGS: There is no evidence of acute infarction, mass lesion, or intra- or extra-axial hemorrhage on CT. Periventricular white matter change likely reflects small vessel ischemic microangiopathy. The posterior fossa, including the cerebellum, brainstem and fourth ventricle, is within normal limits. The third and lateral ventricles, and basal ganglia are unremarkable in appearance. The cerebral hemispheres are symmetric in appearance, with normal gray-white differentiation. No mass effect or midline shift is seen. There is no evidence of fracture; visualized osseous structures are unremarkable in appearance. The orbits are within normal limits. The paranasal sinuses and mastoid air cells are well-aerated. No significant soft tissue abnormalities are seen. IMPRESSION: 1. No acute intracranial pathology seen on CT. 2. Mild small vessel ischemic microangiopathy. Electronically Signed   By: Garald Balding M.D.   On: 08/22/2015 00:55   Dg Chest Port 1 View  08/22/2015  CLINICAL  DATA:  Altered mental status.  Initial encounter. EXAM: PORTABLE CHEST 1 VIEW COMPARISON:  Chest radiograph  performed 08/03/2015 FINDINGS: The lungs are well-aerated. Mild vascular congestion is noted. Mild peribronchial thickening is seen. There is no evidence of focal opacification, pleural effusion or pneumothorax. An apparent small calcification is noted overlying the left lower lung zone. The cardiomediastinal silhouette is within normal limits. No acute osseous abnormalities are seen. There is chronic superior subluxation of the right humeral head, with underlying degenerative change. IMPRESSION: Mild vascular congestion noted. Mild peribronchial thickening seen. Lungs otherwise grossly clear. Electronically Signed   By: Garald Balding M.D.   On: 08/22/2015 00:54   I have personally reviewed and evaluated these images and lab results as part of my medical decision-making.   MDM   Final diagnoses:  Altered mental status, unspecified altered mental status type  Normochromic normocytic anemia  Renal insufficiency    Decreased level of consciousness of uncertain cause. No focal findings on exam. Old records were reviewed and she has multiple ED visits for falls and does have 1 prior episode of hypoglycemia. No actual low blood sugars have been recorded and blood sugars are normal on laboratory testing here. Urinalysis shows no evidence of infection and CT of head is unremarkable. Electrolytes are normal. Renal insufficiency is unchanged from baseline. Hemoglobin is noted to have dropped over the last 2 weeks and rectal exam was done yielding black stool which tested Hemoccult negative. ED workup does not demonstrate reason for altered mentation. Case is discussed with Dr. Myna Hidalgo of [facility] who agrees to admit the patient for further evaluation.  I personally performed the services described in this documentation, which was scribed in my presence. The recorded information has been reviewed and is accurate.       Delora Fuel, MD [phone] [id number]

## 2015-08-21 NOTE — ED Notes (Signed)
Pt transported from [facility] for ?hypoglycemia, CBG 81. Pt very resistive to any care measures. Pt unwilling to open eyes or respond to EMS but resist to any care.

## 2015-08-22 ENCOUNTER — Emergency Department (HOSPITAL_COMMUNITY): Payer: Medicare Other

## 2015-08-22 ENCOUNTER — Inpatient Hospital Stay (HOSPITAL_COMMUNITY): Payer: Medicare Other

## 2015-08-22 ENCOUNTER — Encounter (HOSPITAL_COMMUNITY): Payer: Self-pay | Admitting: Family Medicine

## 2015-08-22 DIAGNOSIS — Z794 Long term (current) use of insulin: Secondary | ICD-10-CM | POA: Diagnosis not present

## 2015-08-22 DIAGNOSIS — Z86711 Personal history of pulmonary embolism: Secondary | ICD-10-CM | POA: Diagnosis not present

## 2015-08-22 DIAGNOSIS — E119 Type 2 diabetes mellitus without complications: Secondary | ICD-10-CM

## 2015-08-22 DIAGNOSIS — N183 Chronic kidney disease, stage 3 (moderate): Secondary | ICD-10-CM

## 2015-08-22 DIAGNOSIS — G309 Alzheimer's disease, unspecified: Secondary | ICD-10-CM | POA: Diagnosis present

## 2015-08-22 DIAGNOSIS — Z515 Encounter for palliative care: Secondary | ICD-10-CM | POA: Diagnosis not present

## 2015-08-22 DIAGNOSIS — F0281 Dementia in other diseases classified elsewhere with behavioral disturbance: Secondary | ICD-10-CM | POA: Diagnosis present

## 2015-08-22 DIAGNOSIS — Z853 Personal history of malignant neoplasm of breast: Secondary | ICD-10-CM | POA: Diagnosis not present

## 2015-08-22 DIAGNOSIS — G308 Other Alzheimer's disease: Secondary | ICD-10-CM | POA: Diagnosis not present

## 2015-08-22 DIAGNOSIS — E785 Hyperlipidemia, unspecified: Secondary | ICD-10-CM | POA: Diagnosis present

## 2015-08-22 DIAGNOSIS — E1122 Type 2 diabetes mellitus with diabetic chronic kidney disease: Secondary | ICD-10-CM | POA: Diagnosis present

## 2015-08-22 DIAGNOSIS — F0390 Unspecified dementia without behavioral disturbance: Secondary | ICD-10-CM | POA: Diagnosis not present

## 2015-08-22 DIAGNOSIS — R410 Disorientation, unspecified: Secondary | ICD-10-CM | POA: Diagnosis not present

## 2015-08-22 DIAGNOSIS — Z86718 Personal history of other venous thrombosis and embolism: Secondary | ICD-10-CM | POA: Diagnosis not present

## 2015-08-22 DIAGNOSIS — Z681 Body mass index (BMI) 19 or less, adult: Secondary | ICD-10-CM | POA: Diagnosis not present

## 2015-08-22 DIAGNOSIS — Z781 Physical restraint status: Secondary | ICD-10-CM | POA: Diagnosis not present

## 2015-08-22 DIAGNOSIS — I129 Hypertensive chronic kidney disease with stage 1 through stage 4 chronic kidney disease, or unspecified chronic kidney disease: Secondary | ICD-10-CM | POA: Diagnosis present

## 2015-08-22 DIAGNOSIS — D649 Anemia, unspecified: Secondary | ICD-10-CM | POA: Insufficient documentation

## 2015-08-22 DIAGNOSIS — K219 Gastro-esophageal reflux disease without esophagitis: Secondary | ICD-10-CM | POA: Diagnosis present

## 2015-08-22 DIAGNOSIS — E440 Moderate protein-calorie malnutrition: Secondary | ICD-10-CM | POA: Diagnosis present

## 2015-08-22 DIAGNOSIS — Z7189 Other specified counseling: Secondary | ICD-10-CM | POA: Diagnosis not present

## 2015-08-22 DIAGNOSIS — I10 Essential (primary) hypertension: Secondary | ICD-10-CM | POA: Diagnosis not present

## 2015-08-22 DIAGNOSIS — G9340 Encephalopathy, unspecified: Secondary | ICD-10-CM | POA: Diagnosis present

## 2015-08-22 DIAGNOSIS — R451 Restlessness and agitation: Secondary | ICD-10-CM | POA: Diagnosis present

## 2015-08-22 DIAGNOSIS — Z833 Family history of diabetes mellitus: Secondary | ICD-10-CM | POA: Diagnosis not present

## 2015-08-22 DIAGNOSIS — Z66 Do not resuscitate: Secondary | ICD-10-CM | POA: Diagnosis present

## 2015-08-22 DIAGNOSIS — D72819 Decreased white blood cell count, unspecified: Secondary | ICD-10-CM | POA: Diagnosis present

## 2015-08-22 DIAGNOSIS — E162 Hypoglycemia, unspecified: Secondary | ICD-10-CM | POA: Diagnosis present

## 2015-08-22 DIAGNOSIS — Z8249 Family history of ischemic heart disease and other diseases of the circulatory system: Secondary | ICD-10-CM | POA: Diagnosis not present

## 2015-08-22 DIAGNOSIS — R4182 Altered mental status, unspecified: Secondary | ICD-10-CM | POA: Diagnosis present

## 2015-08-22 DIAGNOSIS — Z79899 Other long term (current) drug therapy: Secondary | ICD-10-CM | POA: Diagnosis not present

## 2015-08-22 LAB — URINALYSIS, ROUTINE W REFLEX MICROSCOPIC
Bilirubin Urine: NEGATIVE
GLUCOSE, UA: 100 mg/dL — AB
Hgb urine dipstick: NEGATIVE
KETONES UR: NEGATIVE mg/dL
LEUKOCYTES UA: NEGATIVE
NITRITE: NEGATIVE
PROTEIN: NEGATIVE mg/dL
Specific Gravity, Urine: 1.018 (ref 1.005–1.030)
pH: 6.5 (ref 5.0–8.0)

## 2015-08-22 LAB — COMPREHENSIVE METABOLIC PANEL
ALBUMIN: 3.1 g/dL — AB (ref 3.5–5.0)
ALT: 17 U/L (ref 14–54)
ANION GAP: 9 (ref 5–15)
AST: 19 U/L (ref 15–41)
Alkaline Phosphatase: 97 U/L (ref 38–126)
BILIRUBIN TOTAL: 0.6 mg/dL (ref 0.3–1.2)
BUN: 30 mg/dL — ABNORMAL HIGH (ref 6–20)
CHLORIDE: 104 mmol/L (ref 101–111)
CO2: 28 mmol/L (ref 22–32)
Calcium: 9 mg/dL (ref 8.9–10.3)
Creatinine, Ser: 1.49 mg/dL — ABNORMAL HIGH (ref 0.44–1.00)
GFR calc Af Amer: 37 mL/min — ABNORMAL LOW (ref >60)
GFR, EST NON AFRICAN AMERICAN: 32 mL/min — AB (ref >60)
Glucose, Bld: 136 mg/dL — ABNORMAL HIGH (ref 65–99)
POTASSIUM: 4.8 mmol/L (ref 3.5–5.1)
Sodium: 141 mmol/L (ref 135–145)
TOTAL PROTEIN: 6.6 g/dL (ref 6.5–8.1)

## 2015-08-22 LAB — BLOOD GAS, ARTERIAL
Acid-Base Excess: 2.5 mmol/L — ABNORMAL HIGH (ref 0.0–2.0)
Bicarbonate: 26.6 mEq/L — ABNORMAL HIGH (ref 20.0–24.0)
Drawn by: 270211
FIO2: 0.21
O2 Saturation: 95 %
Patient temperature: 98.6
TCO2: 25.6 mmol/L (ref 0–100)
pCO2 arterial: 41.5 mmHg (ref 35.0–45.0)
pH, Arterial: 7.423 (ref 7.350–7.450)
pO2, Arterial: 76.7 mmHg — ABNORMAL LOW (ref 80.0–100.0)

## 2015-08-22 LAB — CBC WITH DIFFERENTIAL/PLATELET
BASOS ABS: 0 10*3/uL (ref 0.0–0.1)
BASOS PCT: 0 %
EOS PCT: 0 %
Eosinophils Absolute: 0 10*3/uL (ref 0.0–0.7)
HCT: 27.9 % — ABNORMAL LOW (ref 36.0–46.0)
Hemoglobin: 8.7 g/dL — ABNORMAL LOW (ref 12.0–15.0)
Lymphocytes Relative: 4 %
Lymphs Abs: 0.3 10*3/uL — ABNORMAL LOW (ref 0.7–4.0)
MCH: 30.7 pg (ref 26.0–34.0)
MCHC: 31.2 g/dL (ref 30.0–36.0)
MCV: 98.6 fL (ref 78.0–100.0)
MONO ABS: 0.7 10*3/uL (ref 0.1–1.0)
Monocytes Relative: 8 %
Neutro Abs: 6.8 10*3/uL (ref 1.7–7.7)
Neutrophils Relative %: 88 %
PLATELETS: 198 10*3/uL (ref 150–400)
RBC: 2.83 MIL/uL — ABNORMAL LOW (ref 3.87–5.11)
RDW: 14.5 % (ref 11.5–15.5)
WBC: 7.7 10*3/uL (ref 4.0–10.5)

## 2015-08-22 LAB — VITAMIN B12: VITAMIN B 12: 1117 pg/mL — AB (ref 180–914)

## 2015-08-22 LAB — CBC
HEMATOCRIT: 28.8 % — AB (ref 36.0–46.0)
HEMOGLOBIN: 9.1 g/dL — AB (ref 12.0–15.0)
MCH: 30.3 pg (ref 26.0–34.0)
MCHC: 31.6 g/dL (ref 30.0–36.0)
MCV: 96 fL (ref 78.0–100.0)
Platelets: 193 10*3/uL (ref 150–400)
RBC: 3 MIL/uL — ABNORMAL LOW (ref 3.87–5.11)
RDW: 14.2 % (ref 11.5–15.5)
WBC: 4.5 10*3/uL (ref 4.0–10.5)

## 2015-08-22 LAB — SEDIMENTATION RATE: SED RATE: 58 mm/h — AB (ref 0–22)

## 2015-08-22 LAB — AMMONIA: Ammonia: 11 umol/L (ref 9–35)

## 2015-08-22 LAB — ETHANOL

## 2015-08-22 LAB — POC OCCULT BLOOD, ED: FECAL OCCULT BLD: NEGATIVE

## 2015-08-22 LAB — GLUCOSE, CAPILLARY
GLUCOSE-CAPILLARY: 121 mg/dL — AB (ref 65–99)
Glucose-Capillary: 125 mg/dL — ABNORMAL HIGH (ref 65–99)
Glucose-Capillary: 124 mg/dL — ABNORMAL HIGH (ref 65–99)

## 2015-08-22 LAB — HIV ANTIBODY (ROUTINE TESTING W REFLEX): HIV SCREEN 4TH GENERATION: NONREACTIVE

## 2015-08-22 LAB — RPR: RPR: NONREACTIVE

## 2015-08-22 LAB — I-STAT CG4 LACTIC ACID, ED: Lactic Acid, Venous: 0.91 mmol/L (ref 0.5–2.0)

## 2015-08-22 LAB — TROPONIN I: Troponin I: <0.03 ng/mL (ref <0.031)

## 2015-08-22 LAB — T4, FREE: Free T4: 0.9 ng/dL (ref 0.61–1.12)

## 2015-08-22 LAB — TSH: TSH: 1.377 u[IU]/mL (ref 0.350–4.500)

## 2015-08-22 MED ORDER — SENNA 8.6 MG PO TABS: 1.0000 | ORAL_TABLET | Freq: Every day | ORAL | Status: DC | PRN

## 2015-08-22 MED ORDER — HEPARIN SODIUM (PORCINE) 5000 UNIT/ML IJ SOLN
5000.0000 [IU] | Freq: Three times a day (TID) | INTRAMUSCULAR | Status: DC
Start: 2015-08-22 — End: 2015-08-31
  Administered 2015-08-22 – 2015-08-31 (×27): 5000 [IU] via SUBCUTANEOUS
  Filled 2015-08-22 (×29): qty 1

## 2015-08-22 MED ORDER — DEXTROSE-NACL 5-0.45 % IV SOLN
INTRAVENOUS | Status: DC
  Administered 2015-08-22: 75 mL/h via INTRAVENOUS
  Administered 2015-08-23 – 2015-08-24 (×3): via INTRAVENOUS

## 2015-08-22 MED ORDER — ACETAMINOPHEN 325 MG PO TABS
650.0000 mg | ORAL_TABLET | Freq: Four times a day (QID) | ORAL | Status: DC | PRN
  Administered 2015-08-24 – 2015-08-25 (×2): 650 mg via ORAL
  Filled 2015-08-22 (×2): qty 2

## 2015-08-22 MED ORDER — SODIUM CHLORIDE 0.9 % IV SOLN
INTRAVENOUS | Status: DC
  Administered 2015-08-22: 05:00:00 via INTRAVENOUS

## 2015-08-22 MED ORDER — INSULIN ASPART 100 UNIT/ML ~~LOC~~ SOLN: 0.0000 [IU] | Freq: Three times a day (TID) | SUBCUTANEOUS | Status: DC

## 2015-08-22 MED ORDER — LORAZEPAM 2 MG/ML IJ SOLN
0.5000 mg | Freq: Four times a day (QID) | INTRAMUSCULAR | Status: DC | PRN
  Administered 2015-08-22: 0.5 mg via INTRAVENOUS
  Filled 2015-08-22: qty 1

## 2015-08-22 MED ORDER — VITAMIN B-12 1000 MCG PO TABS
1000.0000 ug | ORAL_TABLET | Freq: Every day | ORAL | Status: DC
  Filled 2015-08-22: qty 1

## 2015-08-22 MED ORDER — MEMANTINE HCL 10 MG PO TABS
10.0000 mg | ORAL_TABLET | Freq: Two times a day (BID) | ORAL | Status: DC
  Filled 2015-08-22 (×2): qty 1

## 2015-08-22 MED ORDER — PANTOPRAZOLE SODIUM 40 MG IV SOLR
40.0000 mg | Freq: Two times a day (BID) | INTRAVENOUS | Status: DC
  Administered 2015-08-22 – 2015-08-31 (×17): 40 mg via INTRAVENOUS
  Filled 2015-08-22 (×18): qty 40

## 2015-08-22 MED ORDER — THIOTHIXENE 2 MG PO CAPS
2.0000 mg | ORAL_CAPSULE | Freq: Two times a day (BID) | ORAL | Status: DC
  Filled 2015-08-22 (×2): qty 1

## 2015-08-22 MED ORDER — PANTOPRAZOLE SODIUM 40 MG PO TBEC: 40.0000 mg | DELAYED_RELEASE_TABLET | Freq: Every day | ORAL | Status: DC

## 2015-08-22 MED ORDER — FERROUS SULFATE 325 (65 FE) MG PO TABS
325.0000 mg | ORAL_TABLET | Freq: Two times a day (BID) | ORAL | Status: DC
  Filled 2015-08-22 (×2): qty 1

## 2015-08-22 MED ORDER — ENSURE ENLIVE PO LIQD
237.0000 mL | Freq: Three times a day (TID) | ORAL | Status: DC
  Administered 2015-08-22 – 2015-08-31 (×7): 237 mL via ORAL

## 2015-08-22 MED ORDER — DIPHENHYDRAMINE HCL 50 MG/ML IJ SOLN
12.5000 mg | Freq: Once | INTRAMUSCULAR | Status: AC
Start: 2015-08-22 — End: 2015-08-22
  Administered 2015-08-22: 12.5 mg via INTRAVENOUS
  Filled 2015-08-22: qty 1

## 2015-08-22 MED ORDER — ADULT MULTIVITAMIN W/MINERALS CH
1.0000 | ORAL_TABLET | Freq: Every day | ORAL | Status: DC
  Administered 2015-08-23 – 2015-08-28 (×5): 1 via ORAL
  Filled 2015-08-22 (×7): qty 1

## 2015-08-22 MED ORDER — IRBESARTAN 75 MG PO TABS
75.0000 mg | ORAL_TABLET | Freq: Every day | ORAL | Status: DC
  Filled 2015-08-22: qty 1

## 2015-08-22 MED ORDER — ASPIRIN 300 MG RE SUPP
300.0000 mg | Freq: Every day | RECTAL | Status: DC
  Administered 2015-08-22: 300 mg via RECTAL
  Filled 2015-08-22 (×2): qty 1

## 2015-08-22 MED ORDER — HYDRALAZINE HCL 20 MG/ML IJ SOLN: 10.0000 mg | Freq: Three times a day (TID) | INTRAMUSCULAR | Status: DC | PRN

## 2015-08-22 MED ORDER — ROSUVASTATIN CALCIUM 20 MG PO TABS
20.0000 mg | ORAL_TABLET | Freq: Every day | ORAL | Status: DC
  Administered 2015-08-23 – 2015-08-28 (×5): 20 mg via ORAL
  Filled 2015-08-22 (×6): qty 1

## 2015-08-22 MED ORDER — CALCITRIOL 0.25 MCG PO CAPS
0.2500 ug | ORAL_CAPSULE | Freq: Every day | ORAL | Status: DC
  Filled 2015-08-22: qty 1

## 2015-08-22 MED ORDER — VENLAFAXINE HCL 75 MG PO TABS
75.0000 mg | ORAL_TABLET | Freq: Two times a day (BID) | ORAL | Status: DC
Start: 2015-08-22 — End: 2015-08-22
  Filled 2015-08-22 (×3): qty 1

## 2015-08-22 MED ORDER — THIOTHIXENE 1 MG PO CAPS
1.0000 mg | ORAL_CAPSULE | Freq: Two times a day (BID) | ORAL | Status: DC
  Administered 2015-08-23 – 2015-08-24 (×3): 1 mg via ORAL
  Filled 2015-08-22 (×6): qty 1

## 2015-08-22 NOTE — Progress Notes (Signed)
SLP Cancellation Note  Patient Details Name: Kristina [name] MRN: [id number] DOB: Feb 14, 1931   Cancelled treatment:       Reason Eval/Treat Not Completed: Fatigue/lethargy limiting ability to participate  The patient was unable to rouse enough to safely participate in BSE. ST will follow up tomorrow for possible BSE.    Shelly Flatten, MA, [facility] SLP [phone]  Kristina [name] 08/22/2015, 2:07 PM

## 2015-08-22 NOTE — Progress Notes (Signed)
Received patient from [facility], lethargic, agree with previous RN's assessment.Husband at bedside.

## 2015-08-22 NOTE — Progress Notes (Addendum)
[facility] PROGRESS NOTE  Kristina [name] [id number] DOB: 01-02-1931 DOA: 08/21/2015 PCP: Rulon Eisenmenger, NP  Assessment/Plan: Kristina [name] is a 82 y.o. female with PMH of insulin-dependent diabetes mellitus, chronic kidney disease stage III, and Alzheimer's dementia with behavioral disturbance who presents from her SNF with a decreased level of consciousness. SNF personnel suspected hypoglycemia tonight when patient could not be roused and activated EMS. CBG was 81 on the scene and patient was reportedly very resistant to EMS attempts at care. Per report, the patient is typically verbal, though confused, and assists with her feeds. Prior to arrival, the patient was reportedly not opening her eyes or giving any response except withdrawing from pain.  In ED, patient was found to be afebrile, saturating well on room air, and with vital signs stable. CT head without contrast was obtained and negative for acute intracranial abnormality. Chest x-ray revealed mild vascular congestion and mild peribronchial thickening but the airways were otherwise clear. Initial blood work returned notable for a slight bump in her serum creatinine and an approximate 2 g drop in hemoglobin from 3 weeks ago. FOBT was negative and there is no sign of active bleed.   1. Acute encephalopathy, unknown etiology  -per husband this is not patient baseline, at baseline she is able to ambulate with walker, some times able to feed herself, able to speak, but confuse.  - CT head with no acute intracranial abnormality  - Electrolytes stable  - UA negative  -TSH 1.377, ammonia 11, B-12 pending, folate, RPR  - Holding Neurontin  -will check MRI, ABG to evaluate for hypercapnia.  -start IV fluids.  -Neurology consulted.   2. Insulin-dependent DM  - Last A1c 8.2% in 2014  - CBG with meals and qHS, q4h if not eating  - Hold Insulin  - Update A1c, pending   3. Hypertension  - Hold oral medications,   -IV hydralazine.   4. CKD stage III  - SCr 1.49 on arrival, up from apparent baseline of 1.3  - Daily chem panel   5. Normocytic anemia  - Hgb 8.7 on admission, down from apparent baseline of 10.5  - Was 10.5 on 08/03/15  - FOBT is neg  - No sign of active bleed  - Continuing pharmacologic VTE ppx with caution  - Trend; consider transfusing for Hgb <7     Code Status: Full Code.  Family Communication: Husband at bedside.  Disposition Plan: remain inpatient for treatment and work up of encephalopathy.    Consultants:  Neurology  Procedures:  none  Antibiotics:  none  HPI/Subjective: Lethargic, moves upper extremity at times. Was able to say no, and mumble some words.   Objective: Filed Vitals:   08/22/15 0430 08/22/15 0549  BP: 133/73 157/67  Pulse:  93  Temp:  98 F (36.7 C)  Resp:  22   No intake or output data in the 24 hours ending 08/22/15 0908 Filed Weights   08/22/15 0600  Weight: 52.164 kg (115 lb)    Exam:   General:  Lethargic, protecting airway.,  Cardiovascular: S 1, S 2 RRR  Respiratory: Bilateral air movement, coarse sounds.  Abdomen: BS presents, soft nt  Musculoskeletal: no edema  Data Reviewed: Basic Metabolic Panel:  Recent Labs Lab 08/22/15 0103  NA 141  K 4.8  CL 104  CO2 28  GLUCOSE 136*  BUN 30*  CREATININE 1.49*  CALCIUM 9.0   Liver Function Tests:  Recent Labs Lab 08/22/15 0103  AST 19  ALT 17  ALKPHOS 97  BILITOT 0.6  PROT 6.6  ALBUMIN 3.1*   No results for input(s): LIPASE, AMYLASE in the last 168 hours.  Recent Labs Lab 08/22/15 0103  AMMONIA 11   CBC:  Recent Labs Lab 08/22/15 0103  WBC 7.7  NEUTROABS 6.8  HGB 8.7*  HCT 27.9*  MCV 98.6  PLT 198   Cardiac Enzymes:  Recent Labs Lab 08/22/15 0418  TROPONINI <0.03   BNP (last 3 results)  Recent Labs  12/24/14 2035  BNP 41.2    ProBNP (last 3 results) No results for input(s): PROBNP in the last 8760  hours.  CBG:  Recent Labs Lab 08/22/15 0801  GLUCAP 121*    No results found for this or any previous visit (from the past 240 hour(s)).   Studies: Ct Head Wo Contrast  08/22/2015  CLINICAL DATA:  Acute onset of altered mental status. Initial encounter. EXAM: CT HEAD WITHOUT CONTRAST TECHNIQUE: Contiguous axial images were obtained from the base of the skull through the vertex without intravenous contrast. COMPARISON:  CT of the head performed 08/16/2015 FINDINGS: There is no evidence of acute infarction, mass lesion, or intra- or extra-axial hemorrhage on CT. Periventricular white matter change likely reflects small vessel ischemic microangiopathy. The posterior fossa, including the cerebellum, brainstem and fourth ventricle, is within normal limits. The third and lateral ventricles, and basal ganglia are unremarkable in appearance. The cerebral hemispheres are symmetric in appearance, with normal gray-white differentiation. No mass effect or midline shift is seen. There is no evidence of fracture; visualized osseous structures are unremarkable in appearance. The orbits are within normal limits. The paranasal sinuses and mastoid air cells are well-aerated. No significant soft tissue abnormalities are seen. IMPRESSION: 1. No acute intracranial pathology seen on CT. 2. Mild small vessel ischemic microangiopathy. Electronically Signed   By: Garald Balding M.D.   On: 08/22/2015 00:55   Dg Chest Port 1 View  08/22/2015  CLINICAL DATA:  Altered mental status.  Initial encounter. EXAM: PORTABLE CHEST 1 VIEW COMPARISON:  Chest radiograph performed 08/03/2015 FINDINGS: The lungs are well-aerated. Mild vascular congestion is noted. Mild peribronchial thickening is seen. There is no evidence of focal opacification, pleural effusion or pneumothorax. An apparent small calcification is noted overlying the left lower lung zone. The cardiomediastinal silhouette is within normal limits. No acute osseous  abnormalities are seen. There is chronic superior subluxation of the right humeral head, with underlying degenerative change. IMPRESSION: Mild vascular congestion noted. Mild peribronchial thickening seen. Lungs otherwise grossly clear. Electronically Signed   By: Garald Balding M.D.   On: 08/22/2015 00:54    Scheduled Meds: . calcitRIOL  0.25 mcg Oral Daily  . feeding supplement (ENSURE ENLIVE)  237 mL Oral TID WC  . ferrous sulfate  325 mg Oral BID  . heparin  5,000 Units Subcutaneous 3 times per day  . insulin aspart  0-9 Units Subcutaneous TID WC  . irbesartan  75 mg Oral Daily  . memantine  10 mg Oral BID  . multivitamin with minerals  1 tablet Oral Daily  . pantoprazole  40 mg Oral Daily  . rosuvastatin  20 mg Oral Daily  . thiothixene  2 mg Oral BID  . venlafaxine  75 mg Oral BID WC  . vitamin B-12  1,000 mcg Oral Daily   Continuous Infusions: . sodium chloride 75 mL/hr at 08/22/15 0455    Principal Problem:   Altered mental status Active Problems:   Insulin dependent diabetes mellitus (  HCC)   HTN (hypertension)   CKD (chronic kidney disease) stage 3, GFR 30-59 ml/min   Dementia of Alzheimer's type with behavioral disturbance   Acute encephalopathy   Normochromic normocytic anemia    Time spent: 35 minutes.     Niel Hummer A  [facility] Pager [phone]. If 7PM-7AM, please contact night-coverage at [email or web address], password [facility] 08/22/2015, 9:08 AM  LOS: 0 days

## 2015-08-22 NOTE — Progress Notes (Signed)
Pt to transfer to 4 [facility] telemetry per MD order. Report called to Shirlee Limerick, RN regarding pt status, tests and labs. Shirlee Limerick aware to follow up with Dr. Tyrell Antonio when CBC done this afternoon with results. Husband remains at bedside. Waunita Schooner, [place] with neurology recently in to see pt.

## 2015-08-22 NOTE — Progress Notes (Signed)
Called to do bedside stroke swallow screen. Unable to get pt to open her eyes, or follow other commands.  Will order speech therapist to evaluate.

## 2015-08-22 NOTE — Progress Notes (Signed)
Transported to 4 [place] via bed. Accompanied by husband, NT and nurse. updated Shirlee Limerick, RN upon arrival that pt opening eyes more in last hour with minimal verbalization.

## 2015-08-22 NOTE — ED Notes (Signed)
Nurse drawing labs. 

## 2015-08-22 NOTE — Consult Note (Addendum)
Reason for Consult: Decreased level of consciousness. Referring Physician: Dr.Regalado  CC: Poorly responsive  HPI: Kristina [name] is an 82 y.o. female with a history of diabetes mellitus, hypertension, chronic kidney disease, breast cancer, dementia, and previous pulmonary embolus admitted to [facility] from a skilled nursing facility on 08/21/2015 for evaluation of decreased level of consciousness. She was found to be poorly responsive by the staff who initially felt that she was hypoglycemic however her glucose came back at 81. So far her workup has been unrevealing. A CT scan of the head showed no acute changes. An MRI of the brain is pending. Neurology was asked to see the patient.  Past Medical History  Diagnosis Date  . Diabetes mellitus   . Hypertension   . Renal disorder   . Cancer ([place]) 2000    sp bilateral masectomy, sp chemo  . DVT (deep venous thrombosis) ([place])   . PE (pulmonary embolism)   . Dementia   . GERD (gastroesophageal reflux disease)   . Dehydration   . Hypokalemia   . GERD (gastroesophageal reflux disease)   . Fracture of ramus of mandible ([place])   . Hyperlipidemia   . Alzheimer disease   . Chronic kidney disease (CKD), stage III (moderate)     Past Surgical History  Procedure Laterality Date  . Breast surgery    . Mastecomy      Family History  Problem Relation Age of Onset  . Hypertension Mother   . Diabetes Mellitus II Father     Social History:  reports that she has never smoked. She does not have any smokeless tobacco history on file. She reports that she does not drink alcohol or use illicit drugs.  Allergies  Allergen Reactions  . Cortisone Other (See Comments)    Injection-altered mental status, didn't know her name, where she lived, etc.     . Oxycodone Other (See Comments)    Caused patient to go crazy  . Penicillins Rash    Per MAR     Medications:  Scheduled: . aspirin  300 mg Rectal Daily  . feeding supplement  (ENSURE ENLIVE)  237 mL Oral TID WC  . heparin  5,000 Units Subcutaneous 3 times per day  . multivitamin with minerals  1 tablet Oral Daily  . pantoprazole (PROTONIX) IV  40 mg Intravenous Q12H  . rosuvastatin  20 mg Oral Daily  . thiothixene  1 mg Oral BID    ROS: History obtained from Husband - he reports no recent medical complaints from the patient; however, he notes that she has had multiple hospitalizations for falls. The patient was able to indicate pain in both hips although x-rays this admission were negative for fracture. Patient was not able to provide any other significant review of systems.     Physical Examination: Blood pressure 157/67, pulse 93, temperature 98 F (36.7 C), temperature source Oral, resp. rate 22, weight 52.164 kg (115 lb), SpO2 100 %.   General - 82 year old female who appears younger than her stated age lying on her back with her eyes closed not moving. Heart - Regular rate and rhythm with occasional ectopic beats Lungs - Clear except occasional end-expiratory wheezing Abdomen - Soft - non tender Extremities - Distal pulses intact - left ankle slightly larger than right Skin - Warm and dry   Neurologic Examination  Mental Status: Initially unresponsive the patient eventually opened her eyes and followed a few very simple commands. She was able to answer an  occasional question appropriately but also had nonsensical mumbling. In most instances she did not respond at all to questions. Cranial Nerves: II: Discs not visualized; Visual fields could not be tested. Pupils equal, round, reactive to light and accommodation III,IV, VI: ptosis not present, horizontal extra-ocular motions intact bilaterally with cueing. Unable to prompt the patient for vertical movement. V,VII: smile symmetric, facial light touch sensation normal bilaterally VIII: hearing normal bilaterally IX,X: gag not tested. Speech therapist could not perform swallowing eval. XI: bilateral  shoulder shrug - unable to test XII: midline tongue extension - patient could not follow commands. Motor: The patient moved all extremities spontaneously against gravity although formal muscle testing cannot be performed. Tone and bulk:normal tone throughout; no atrophy noted Sensory: Grimaces to painful stimuli but no withdrawal. Deep Tendon Reflexes: 3+ and symmetric throughout Plantars: Right: downgoing   Left: downgoing Cerebellar: Patient was able to attempt finger to nose testing. This was performed very slowly and inaccurately possibly due to weakness. Gait: Not attempted     Laboratory Studies:   Basic Metabolic Panel:  Recent Labs Lab 08/22/15 0103  NA 141  K 4.8  CL 104  CO2 28  GLUCOSE 136*  BUN 30*  CREATININE 1.49*  CALCIUM 9.0    Liver Function Tests:  Recent Labs Lab 08/22/15 0103  AST 19  ALT 17  ALKPHOS 97  BILITOT 0.6  PROT 6.6  ALBUMIN 3.1*   No results for input(s): LIPASE, AMYLASE in the last 168 hours.  Recent Labs Lab 08/22/15 0103  AMMONIA 11    CBC:  Recent Labs Lab 08/22/15 0103  WBC 7.7  NEUTROABS 6.8  HGB 8.7*  HCT 27.9*  MCV 98.6  PLT 198    Cardiac Enzymes:  Recent Labs Lab 08/22/15 0418  TROPONINI <0.03    BNP: Invalid input(s): POCBNP  CBG:  Recent Labs Lab 08/22/15 0801  GLUCAP 121*    Microbiology: Results for orders placed or performed during the hospital encounter of 07/24/15  Urine culture     Status: None   Collection Time: 07/24/15  7:26 AM  Result Value Ref Range Status   Specimen Description URINE, RANDOM  Final   Special Requests NONE  Final   Culture MULTIPLE SPECIES PRESENT, SUGGEST RECOLLECTION  Final   Report Status 07/25/2015 FINAL  Final    Coagulation Studies: No results for input(s): LABPROT, INR in the last 72 hours.  Urinalysis:  Recent Labs Lab 08/22/15 0021  COLORURINE YELLOW  LABSPEC 1.018  PHURINE 6.5  GLUCOSEU 100*  HGBUR NEGATIVE  BILIRUBINUR NEGATIVE   KETONESUR NEGATIVE  PROTEINUR NEGATIVE  NITRITE NEGATIVE  LEUKOCYTESUR NEGATIVE    Lipid Panel:     Component Value Date/Time   CHOL 189 06/12/2013 1026   TRIG 161.0* 06/12/2013 1026   HDL 54.80 06/12/2013 1026   CHOLHDL 3 06/12/2013 1026   VLDL 32.2 06/12/2013 1026   LDLCALC 102* 06/12/2013 1026    HgbA1C:  Lab Results  Component Value Date   HGBA1C 8.2* 06/12/2013    Urine Drug Screen:  No results found for: LABOPIA, COCAINSCRNUR, LABBENZ, AMPHETMU, THCU, LABBARB  Alcohol Level:  Recent Labs Lab 08/22/15 [place] <5    Other results: EKG: Sinus tachycardia rate 101 bpm with nonspecific changes. Please refer to the formal cardiology reading for complete details.  Imaging:   Ct Head Wo Contrast 08/22/2015   1. No acute intracranial pathology seen on CT.  2. Mild small vessel ischemic microangiopathy.    Dg Chest  Port 1 View 08/22/2015   Mild vascular congestion noted. Mild peribronchial thickening seen. Lungs otherwise grossly clear.     MRI of the Head without contrast 08/22/2015 Exam is motion degraded. No acute infarct or intracranial hemorrhage. Mild chronic small vessel disease changes. Global mild atrophy without hydrocephalus. No intracranial mass lesion noted on this unenhanced exam.    Assessment/Plan: Discussed with Dr. Armida Sans.  Nonfocal exam. No obvious source of the patient's recent cognitive change. Will order an EEG.  If EEG is nonrevealing consider a trial of amantadine. Will continue to follow.  Mikey Bussing PA-C [facility] Pager [phone] 08/22/2015, 1:12 PM Further assessment and plan to follow per attending neurologist. Patient seen and examined together with physician assistant and I concur with the assessment and plan.  Dorian Pod, MD  Neuro attending: 82 y/o with known dementia, admitted with rather sudden and unexplained declined in mental status. Neuro-exam is non focal and no significant  metabolic derangements or infection. MRI brain was personally reviewed and showed no acute abnormality. No new medications or change in doses of her chronic medications. Afebrile, white count is not elevated, doesn't look toxic, thus doubt CNS infection. Agree with obtaining EEG to assess for encephalopathy or subtle SE.  Lorenza Chick [facility]

## 2015-08-22 NOTE — ED Notes (Signed)
BLOOD DRAW DELAYED, PT CURRENTLY GET XRAY.

## 2015-08-22 NOTE — H&P (Signed)
[facility] History and Physical  Kristina [name] [id number] DOB: 03-30-1931 DOA: 08/21/2015  Referring physician: ED physician PCP: Rulon Eisenmenger, NP  Specialists: Dr. Tomi Likens, neurology   Chief Complaint:  Decreased level of consciousness   HPI: Kristina [name] is a 82 y.o. female with PMH of insulin-dependent diabetes mellitus, chronic kidney disease stage III, and Alzheimer's dementia with behavioral disturbance who presents from her SNF with a decreased level of consciousness. SNF personnel suspected hypoglycemia tonight when patient could not be roused and activated EMS. CBG was 81 on the scene and patient was reportedly very resistant to EMS attempts at care. Per report, the patient is typically verbal, though confused, and assists with her feeds. Prior to arrival, the patient was reportedly not opening her eyes or giving any response except withdrawing from pain. Per the SNF personnel, she had been in her usual state until the day of her presentation. There is reportedly been no recent change in her medications.   In ED, patient was found to be afebrile, saturating well on room air, and with vital signs stable. CT head without contrast was obtained and negative for acute intracranial abnormality. Chest x-ray revealed mild vascular congestion and mild peribronchial thickening but the airways were otherwise clear. Initial blood work returned notable for a slight bump in her serum creatinine and an approximate 2 g drop in hemoglobin from 3 weeks ago. FOBT was negative and there is no sign of active bleed. UA was unremarkable. Patient remained stable in the emergency department but would not open her eyes or follow commands, only withdrawing from pain and groaning occasionally. She will be admitted to the hospital for further evaluation and management of acute encephalitis of unknown etiology.  Where does patient live?  SNF      Can patient participate in ADLs?  Barely    Review of  Systems:   General: no fevers, chills, sweats, weight change, poor appetite, or fatigue HEENT: no blurry vision, hearing changes or sore throat Pulm: no dyspnea, cough, or wheeze CV: no chest pain or palpitations Abd: no nausea, vomiting, abdominal pain, diarrhea, or constipation GU: no dysuria, hematuria, increased urinary frequency, or urgency  Ext: no leg edema Neuro: no focal weakness, numbness, or tingling, no vision change or hearing loss Skin: no rash, no wounds MSK: No muscle spasm, no deformity, no red, hot, or swollen joint Heme: No easy bruising or bleeding Travel history: No recent long distant travel    Allergy:  Allergies  Allergen Reactions  . Cortisone Other (See Comments)    Injection-altered mental status, didn't know her name, where she lived, etc.     . Oxycodone Other (See Comments)    Caused patient to go crazy  . Penicillins Rash    Per [facility]     Past Medical History  Diagnosis Date  . Diabetes mellitus   . Hypertension   . Renal disorder   . Cancer ([place]) 2000    sp bilateral masectomy, sp chemo  . DVT (deep venous thrombosis) ([place])   . PE (pulmonary embolism)   . Dementia   . GERD (gastroesophageal reflux disease)   . Dehydration   . Hypokalemia   . GERD (gastroesophageal reflux disease)   . Fracture of ramus of mandible ([place])   . Hyperlipidemia   . Alzheimer disease   . Chronic kidney disease (CKD), stage III (moderate)     Past Surgical History  Procedure Laterality Date  . Breast surgery    . Mastecomy  Social History:  reports that she has never smoked. She does not have any smokeless tobacco history on file. She reports that she does not drink alcohol or use illicit drugs.  Family History:  Family History  Problem Relation Age of Onset  . Hypertension Mother   . Diabetes Mellitus II Father      Prior to Admission medications   Medication Sig Start Date End Date Taking? Authorizing Provider  acetaminophen (TYLENOL) 325 MG  tablet Take 325 mg by mouth 3 (three) times daily.   Yes Historical Provider, MD  alum & mag hydroxide-simeth (MAALOX/MYLANTA) 200-200-20 MG/5ML suspension Take 30 mLs by mouth every 6 (six) hours as needed for indigestion or heartburn.   Yes Historical Provider, MD  calcitRIOL (ROCALTROL) 0.25 MCG capsule Take 1 capsule (0.25 mcg total) by mouth daily. 04/14/15  Yes Courteney Lyn Mackuen, MD  candesartan (ATACAND) 4 MG tablet Take 4 mg by mouth daily. 07/16/15 07/15/16 Yes Historical Provider, MD  feeding supplement ([place]) LIQD Take 237 mLs by mouth 3 (three) times daily with meals.   Yes Historical Provider, MD  ferrous sulfate 325 (65 FE) MG tablet Take 325 mg by mouth 2 (two) times daily. 07/16/15 07/15/16 Yes Historical Provider, MD  gabapentin (NEURONTIN) 100 MG capsule Take 2 capsules (200 mg total) by mouth 2 (two) times daily. 08/05/15  Yes Delfin Gant, NP  guaifenesin (ROBITUSSIN) 100 MG/5ML syrup Take 200 mg by mouth 4 (four) times daily as needed for cough.   Yes Historical Provider, MD  insulin aspart (NOVOLOG) 100 UNIT/ML injection Inject 2-6 Units into the skin 4 (four) times daily as needed for high blood sugar. Blood glucose 150-200=2 units, 201-250=4 units, 251-300=6units   Yes Historical Provider, MD  loperamide (IMODIUM) 2 MG capsule Take 2 mg by mouth daily as needed for diarrhea or loose stools.   Yes Historical Provider, MD  LORazepam (ATIVAN) 0.5 MG tablet Take 1 tablet (0.5 mg total) by mouth 2 (two) times daily. Patient taking differently: Take 0.5 mg by mouth 2 (two) times daily. 2PM AND 8PM 08/05/15  Yes Delfin Gant, NP  magnesium hydroxide (MILK OF MAGNESIA) 400 MG/5ML suspension Take 30 mLs by mouth daily as needed for mild constipation.   Yes Historical Provider, MD  memantine (NAMENDA) 10 MG tablet Take 1 tablet (10 mg total) by mouth 2 (two) times daily. 04/14/15  Yes Courteney Lyn Mackuen, MD  Multiple Vitamin (MULTIVITAMIN) tablet Take 1 tablet  by mouth daily. 04/14/15  Yes Courteney Lyn Mackuen, MD  neomycin-bacitracin-polymyxin (NEOSPORIN) [phone] ointment Apply 1 application topically daily as needed (skin tears).   Yes Historical Provider, MD  pantoprazole (PROTONIX) 40 MG tablet Take 40 mg by mouth daily.   Yes Historical Provider, MD  potassium chloride SA (K-DUR,KLOR-CON) 20 MEQ tablet Take 20 mEq by mouth daily. 07/16/15  Yes Historical Provider, MD  rosuvastatin (CRESTOR) 20 MG tablet Take 1 tablet (20 mg total) by mouth daily. 04/14/15  Yes Courteney Lyn Mackuen, MD  thiothixene (NAVANE) 1 MG capsule Take 2 mg by mouth 2 (two) times daily.    Yes Historical Provider, MD  venlafaxine (EFFEXOR) 75 MG tablet Take 1 tablet (75 mg total) by mouth 2 (two) times daily with a meal. 08/05/15  Yes Delfin Gant, NP  vitamin B-12 (CYANOCOBALAMIN) 1000 MCG tablet Take 1 tablet (1,000 mcg total) by mouth daily. 04/14/15  Yes Courteney Julio Alm, MD    Physical Exam: Filed Vitals:   08/22/15 0150 08/22/15 0200  08/22/15 0230 08/22/15 0300  BP: 129/78 124/56 127/60 135/80  Pulse: 96 94 95 102  Temp:      Resp: 15     SpO2: 96% 92% 93% 97%   General: Not in acute distress HEENT:       Eyes: PERRL, EOMI, no scleral icterus or conjunctival pallor.       ENT: No discharge from the ears or nose, no pharyngeal ulcers, petechiae or exudate, no tonsillar enlargement.        Neck: No JVD, no bruit, no appreciable mass Heme: No cervical adenopathy, no pallor Cardiac: S1/S2, RRR, No murmurs, No gallops or rubs. Pulm: Good air movement bilaterally. No rales, wheezing, rhonchi or rubs. Abd: Soft, nondistended, nontender, no rebound pain or gaurding, no mass or organomegaly, BS present. Ext: No LE edema bilaterally. 2+DP/PT pulse bilaterally. Musculoskeletal: No gross deformity, no red, hot, swollen joints, no limitation in ROM  Skin: No rashes or wounds on exposed surfaces  Neuro: Alert, oriented X3, cranial nerves II-XII grossly intact,  muscle strength 5/5 in all extremities, sensation to light touch intact. Brachial reflex 2+ bilaterally. Knee reflex 2+ bilaterally. Negative Babinski's sign. Normal finger to nose test. No focal findings Psych: Patient is not overtly psychotic, denies suicidal or homocidal ideation, no active hallucinations.  Labs on Admission:  Basic Metabolic Panel:  Recent Labs Lab 08/22/15 0103  NA 141  K 4.8  CL 104  CO2 28  GLUCOSE 136*  [name] 30*  CREATININE 1.49*  CALCIUM 9.0   Liver Function Tests:  Recent Labs Lab 08/22/15 0103  AST 19  ALT 17  ALKPHOS 97  BILITOT 0.6  PROT 6.6  ALBUMIN 3.1*   No results for input(s): LIPASE, AMYLASE in the last 168 hours.  Recent Labs Lab 08/22/15 0103  AMMONIA 11   CBC:  Recent Labs Lab 08/22/15 0103  WBC 7.7  NEUTROABS 6.8  HGB 8.7*  HCT 27.9*  MCV 98.6  PLT 198   Cardiac Enzymes: No results for input(s): CKTOTAL, CKMB, CKMBINDEX, TROPONINI in the last 168 hours.  BNP (last 3 results)  Recent Labs  12/24/14 2035  BNP 41.2    ProBNP (last 3 results) No results for input(s): PROBNP in the last 8760 hours.  CBG: No results for input(s): GLUCAP in the last 168 hours.  Radiological Exams on Admission: Ct Head Wo Contrast  08/22/2015  CLINICAL DATA:  Acute onset of altered mental status. Initial encounter. EXAM: CT HEAD WITHOUT CONTRAST TECHNIQUE: Contiguous axial images were obtained from the base of the skull through the vertex without intravenous contrast. COMPARISON:  CT of the head performed 08/16/2015 FINDINGS: There is no evidence of acute infarction, mass lesion, or intra- or extra-axial hemorrhage on CT. Periventricular white matter change likely reflects small vessel ischemic microangiopathy. The posterior fossa, including the cerebellum, brainstem and fourth ventricle, is within normal limits. The third and lateral ventricles, and basal ganglia are unremarkable in appearance. The cerebral hemispheres are symmetric  in appearance, with normal gray-white differentiation. No mass effect or midline shift is seen. There is no evidence of fracture; visualized osseous structures are unremarkable in appearance. The orbits are within normal limits. The paranasal sinuses and mastoid air cells are well-aerated. No significant soft tissue abnormalities are seen. IMPRESSION: 1. No acute intracranial pathology seen on CT. 2. Mild small vessel ischemic microangiopathy. Electronically Signed   By: Garald Balding M.D.   On: 08/22/2015 00:55   Dg Chest Port 1 View  08/22/2015  CLINICAL DATA:  Altered mental status.  Initial encounter. EXAM: PORTABLE CHEST 1 VIEW COMPARISON:  Chest radiograph performed 08/03/2015 FINDINGS: The lungs are well-aerated. Mild vascular congestion is noted. Mild peribronchial thickening is seen. There is no evidence of focal opacification, pleural effusion or pneumothorax. An apparent small calcification is noted overlying the left lower lung zone. The cardiomediastinal silhouette is within normal limits. No acute osseous abnormalities are seen. There is chronic superior subluxation of the right humeral head, with underlying degenerative change. IMPRESSION: Mild vascular congestion noted. Mild peribronchial thickening seen. Lungs otherwise grossly clear. Electronically Signed   By: Garald Balding M.D.   On: 08/22/2015 00:54    EKG:  Ordered and pending.    Assessment/Plan  1. Acute encephalopathy, unknown etiology  - CT head with no acute intracranial abnormality  - Electrolytes stable  - UA negative  - Checking thyroid studies, ammonia, B12, folate, RPR  - Holding Neurontin    2. Insulin-dependent DM  - Last A1c 8.2% in 2014  - CBG with meals and qHS, q4h if not eating  - SSI correctional, sensitive-scale, titrate prn  - Update A1c, pending  - Carb-modified diet when appropriate   3. Hypertension  - Continue home candesartan, converted to formulary equivalent irbesartan  - Normotensive  currently, monitoring   4. CKD stage III  - SCr 1.49 on arrival, up from apparent baseline of 1.3  - Avoiding nephrotoxins where feasible  - Daily chem panel    5. Normocytic anemia  - Hgb 8.7 on admission, down from apparent baseline of 10.5  - Was 10.5 on 08/03/15  - FOBT is neg  - No sign of active bleed  - Continuing pharmacologic VTE ppx with caution  - Trend; consider transfusing for Hgb <7     DVT ppx: SQ Heparin    Code Status: Full code Family Communication: None at bed side.     Disposition Plan: Admit to inpatient   Date of Service 08/22/2015    Vianne Bulls, MD [facility] Pager [phone]  If 7PM-7AM, please contact night-coverage [email or web address] Password [email or web address] 08/22/2015, 4:04 AM

## 2015-08-22 NOTE — ED Notes (Signed)
Pt is nonverbal on arrival which seems consistent with baseline per prior visits.  No indication of pain or discomfort; RN provided warm blanket for comfort.

## 2015-08-22 NOTE — Progress Notes (Signed)
Patient in need of safety sitter due to numerous falls recently prior to admission. During report/ change of shift, pt was attempting to climb out of bed. NT x 2 at bedside to redirect patient. Pt is disoriented x 3, resyless Able to redirect to get back in bed but forgets shortly afterward. Charge nurse and [facility] notified of the need for a sitter to ensure safety. Pt bed alarm is activated, mittens are on both hands, patient is slightly positioned in Trendelenburg. Will continue to monitor patient.

## 2015-08-23 ENCOUNTER — Inpatient Hospital Stay (HOSPITAL_COMMUNITY): Payer: Medicare Other

## 2015-08-23 LAB — CBC
HEMATOCRIT: 28.5 % — AB (ref 36.0–46.0)
HEMOGLOBIN: 9.1 g/dL — AB (ref 12.0–15.0)
MCH: 30.3 pg (ref 26.0–34.0)
MCHC: 31.9 g/dL (ref 30.0–36.0)
MCV: 95 fL (ref 78.0–100.0)
PLATELETS: 191 10*3/uL (ref 150–400)
RBC: 3 MIL/uL — AB (ref 3.87–5.11)
RDW: 14 % (ref 11.5–15.5)
WBC: 3.1 10*3/uL — AB (ref 4.0–10.5)

## 2015-08-23 LAB — GLUCOSE, CAPILLARY
GLUCOSE-CAPILLARY: 181 mg/dL — AB (ref 65–99)
Glucose-Capillary: 155 mg/dL — ABNORMAL HIGH (ref 65–99)
Glucose-Capillary: 156 mg/dL — ABNORMAL HIGH (ref 65–99)
Glucose-Capillary: 191 mg/dL — ABNORMAL HIGH (ref 65–99)
Glucose-Capillary: 164 mg/dL — ABNORMAL HIGH (ref 65–99)

## 2015-08-23 LAB — BASIC METABOLIC PANEL
ANION GAP: 9 (ref 5–15)
BUN: 17 mg/dL (ref 6–20)
CO2: 27 mmol/L (ref 22–32)
Calcium: 8.8 mg/dL — ABNORMAL LOW (ref 8.9–10.3)
Chloride: 104 mmol/L (ref 101–111)
Creatinine, Ser: 1.2 mg/dL — ABNORMAL HIGH (ref 0.44–1.00)
GFR calc Af Amer: 47 mL/min — ABNORMAL LOW (ref >60)
GFR, EST NON AFRICAN AMERICAN: 41 mL/min — AB (ref >60)
GLUCOSE: 166 mg/dL — AB (ref 65–99)
POTASSIUM: 4 mmol/L (ref 3.5–5.1)
Sodium: 140 mmol/L (ref 135–145)

## 2015-08-23 MED ORDER — LORAZEPAM 2 MG/ML IJ SOLN
0.5000 mg | Freq: Once | INTRAMUSCULAR | Status: AC
  Administered 2015-08-23: 0.5 mg via INTRAVENOUS
  Filled 2015-08-23: qty 1

## 2015-08-23 MED ORDER — RESOURCE THICKENUP CLEAR PO POWD
ORAL | Status: DC | PRN
  Filled 2015-08-23: qty 125

## 2015-08-23 MED ORDER — ACETAMINOPHEN 10 MG/ML IV SOLN
1000.0000 mg | Freq: Three times a day (TID) | INTRAVENOUS | Status: AC | PRN
  Administered 2015-08-23 – 2015-08-24 (×2): 1000 mg via INTRAVENOUS
  Filled 2015-08-23 (×3): qty 100

## 2015-08-23 MED ORDER — STARCH (THICKENING) PO POWD
1.0000 g | ORAL | Status: DC | PRN
  Filled 2015-08-23: qty 227

## 2015-08-23 NOTE — Evaluation (Signed)
Clinical/Bedside Swallow Evaluation Patient Details  Name: Kristina [name] MRN: [id number] Date of Birth: 1930-09-19  Today's Date: 08/23/2015 Time: SLP Start Time (ACUTE ONLY): 45 SLP Stop Time (ACUTE ONLY): 1648 SLP Time Calculation (min) (ACUTE ONLY): 33 min  Past Medical History:  Past Medical History  Diagnosis Date  . Diabetes mellitus   . Hypertension   . Renal disorder   . Cancer ([place]) 2000    sp bilateral masectomy, sp chemo  . DVT (deep venous thrombosis) ([place])   . PE (pulmonary embolism)   . Dementia   . GERD (gastroesophageal reflux disease)   . Dehydration   . Hypokalemia   . GERD (gastroesophageal reflux disease)   . Fracture of ramus of mandible ([place])   . Hyperlipidemia   . Alzheimer disease   . Chronic kidney disease (CKD), stage III (moderate)    Past Surgical History:  Past Surgical History  Procedure Laterality Date  . Breast surgery    . Mastecomy     HPI:  82 y.o. female with PMH of insulin-dependent diabetes mellitus, chronic kidney disease stage III, GERD, HTN, mandible fx, and Alzheimer's dementia with behavioral disturbance admitted from SNF with a decreased level of consciousness.  CT head without contrast negative for acute intracranial abnormality. Chest x-ray revealed mild vascular congestion and mild peribronchial thickening but the airways were otherwise clear. Kristina [name] reports SNF will not change diet from regular texture unless seen by SLP or MD orders (pt has been at facility 4 weeks).    Assessment / Plan / Recommendation Clinical Impression  Unfortunately pt exhibited oropharyngeal dysphagia characteristic of advanced/end stage dementia marked by decreased oral manipulation and cohesion, delayed transit, significantly weak/decreased laryngeal ROM, multiple swallows and audible swallow. Consistent delayed cough at end of session due to probable penetration/aspiration. Educated/discussed with Kristina [name] relationship between dysphagia and dementia  and findings of assessment and that alternate means of nutrtion would not be beneficial at present (encouraged her to speak with MD more). Kristina [name] stated "Kristina [name] loves to eat" and desire for her to initiate po's with known risks. Recommending Dys 1 and nectar thick liquids, sit upright, no straws. Will briefly follow for education.     Aspiration Risk  Severe aspiration risk    Diet Recommendation Dysphagia 1 (Puree);Nectar-thick liquid   Liquid Administration via: Cup;No straw Medication Administration: Crushed with puree Supervision: Full supervision/cueing for compensatory strategies;Staff to assist with self feeding Compensations: Slow rate;Small sips/bites Postural Changes: Seated upright at 90 degrees    Other  Recommendations Oral Care Recommendations: Oral care BID   Follow up Recommendations  Skilled Nursing facility    Frequency and Duration min 1 x/week  2 weeks       Prognosis Prognosis for Safe Diet Advancement: Guarded Barriers to Reach Goals: Cognitive deficits;Severity of deficits      Swallow Study   General HPI: 82 y.o. female with PMH of insulin-dependent diabetes mellitus, chronic kidney disease stage III, GERD, HTN, mandible fx, and Alzheimer's dementia with behavioral disturbance admitted from SNF with a decreased level of consciousness.  CT head without contrast negative for acute intracranial abnormality. Chest x-ray revealed mild vascular congestion and mild peribronchial thickening but the airways were otherwise clear. Kristina [name] reports SNF will not change diet from regular texture unless seen by SLP or MD orders (pt has been at facility 4 weeks).  Type of Study: Bedside Swallow Evaluation Previous Swallow Assessment:  (none) Diet Prior to this Study: NPO Temperature Spikes Noted: No Respiratory Status: Room air History  of Recent Intubation: No Behavior/Cognition: Alert;Cooperative;Confused;Distractible;Requires cueing Oral Cavity Assessment: Dry Oral Care  Completed by SLP: Yes Oral Cavity - Dentition: Missing dentition;Poor condition Self-Feeding Abilities: Total assist Patient Positioning: Upright in bed Baseline Vocal Quality: Low vocal intensity Volitional Cough: Cognitively unable to elicit Volitional Swallow: Unable to elicit    Oral/Motor/Sensory Function Overall Oral Motor/Sensory Function:  (unable to formally assess, no focal weakness)   Ice Chips Ice chips: Impaired Presentation: Spoon Oral Phase Impairments: Reduced labial seal;Reduced lingual movement/coordination Oral Phase Functional Implications: Prolonged oral transit Pharyngeal Phase Impairments: Suspected delayed Swallow;Decreased hyoid-laryngeal movement;Multiple swallows;Cough - Delayed   Thin Liquid Thin Liquid: Impaired Presentation: Spoon Oral Phase Impairments: Reduced labial seal Oral Phase Functional Implications: Left anterior spillage;Right anterior spillage Pharyngeal  Phase Impairments: Suspected delayed Swallow;Decreased hyoid-laryngeal movement;Multiple swallows;Cough - Delayed    Nectar Thick Nectar Thick Liquid: Not tested   Honey Thick Honey Thick Liquid: Not tested   Puree Puree: Impaired Oral Phase Impairments: Reduced lingual movement/coordination Oral Phase Functional Implications: Prolonged oral transit Pharyngeal Phase Impairments: Suspected delayed Swallow;Decreased hyoid-laryngeal movement;Multiple swallows;Cough - Delayed   Solid   GO   Solid: Not tested        Houston Siren 08/23/2015,4:58 PM   [name].Ed [employer] [phone]

## 2015-08-23 NOTE — Clinical Social Work Note (Signed)
Clinical Social Work Assessment  Patient Details  Name: Kristina [name] MRN: [id number] Date of Birth: 1930-10-05  Date of referral:  08/23/15               Reason for consult:  Facility Placement (From ALF- ? increased to SNF)                Permission sought to share information with:  Family Supports, Chartered certified accountant granted to share information::  Yes, Verbal Permission Granted (By husband Herbie Baltimore. Patient is alert but confused)  Name::     [facility], [place] SNF's if indicated, [place]::     Relationship::     Contact Information:     Housing/Transportation Living arrangements for the past 2 months:  [facility]) Source of Information:  Spouse Patient Interpreter Needed:  None Criminal Activity/Legal Involvement Pertinent to Current Situation/Hospitalization:  No - Comment as needed Significant Relationships:  Adult Children, Spouse Lives with:   Assisted Living Do you feel safe going back to the place where you live?  No (Per husband- patient is not well managed at facility) Need for family participation in patient care:  Yes (Comment)  Care giving concerns:  Husband indicates patient is not thriving at facility; multiple falls and requires locked facility as she tries to leave the ALF.  "They aren't able to look after her care needs."   Social Worker assessment / plan: CSW met with husband Herbie Baltimore this morning to discuss patient's current level of care and discharge disposition.  He relates that she was placed at [facility] ([place] unit) about 3+ months ago after a series of events at home where she wandered from home and he found he could no longer manage her care.  Husband indicates concerns about her care at the facility- she is diabetic and has had multiple falls at the facility.  He does not feel that staff are able to adequately able to manage her care and wants her placed in  another facility. Discussed differences in ALF, SNF as well as the need for a memory care unit due to her ability to wander.  Note left for MD to consider PT/OT Evals to determine if SNF level is indicated. Fl2 initiated for patient and will need to monitor for care needs.  Husband requests that CSW contact their daughter Ladona Mow for future decision making  [phone].  Employment status:  Retired Advertising copywriter, Medicaid In [place] PT Recommendations:  Not assessed at this time [place] / Referral to community resources:   None at this time  Patient/Family's Response to care:  Patient is alert but disoriented; unable to respond to care needs at this time.  Husband indicates that he is worried about her but glad that she is in the hospital. He feels she is receiving good care.  Patient/Family's Understanding of and Emotional Response to Diagnosis, Current Treatment, and Prognosis:  Patient has no understanding or response to her diagnosis, treatment or prognosis due to confusion.  Husband is able to verbalize a good understanding of her current medical condition as well as state of confusion.  While he is very involved, he requests deferment to his daughter for further decision making and he will continue to be supportive and involved.   Emotional Assessment Appearance:  Appears stated age Attitude/Demeanor/Rapport:  Uncooperative (confused; sitter indicated. High risk for falls.) Affect (typically observed):  Anxious (high risk for falls) Orientation:  Oriented to Self Alcohol / Substance use:  Never Used Psych involvement (Current and /or in the community):  No (Comment)  Discharge Needs  Concerns to be addressed:  Cognitive Concerns, Care Coordination Readmission within the last 30 days:  No (Has had MULTIPLE ED admissions) Current discharge risk:  Cognitively Impaired, Dependent with Mobility (Falls) Barriers to Discharge:  Requiring sitter/restraints, Continued  Medical Work up   Estill Bakes 08/23/2015, 11:54 AM

## 2015-08-23 NOTE — Progress Notes (Signed)
[facility] PROGRESS NOTE  Kristina [name] [id number] DOB: 1930-10-15 DOA: 08/21/2015 PCP: Rulon Eisenmenger, NP  Assessment/Plan: Kristina [name] is a 82 y.o. female with PMH of insulin-dependent diabetes mellitus, chronic kidney disease stage III, and Alzheimer's dementia with behavioral disturbance who presents from her SNF with a decreased level of consciousness. SNF personnel suspected hypoglycemia tonight when patient could not be roused and activated EMS. CBG was 81 on the scene and patient was reportedly very resistant to EMS attempts at care. Per report, the patient is typically verbal, though confused, and assists with her feeds. Prior to arrival, the patient was reportedly not opening her eyes or giving any response except withdrawing from pain.  In ED, patient was found to be afebrile, saturating well on room air, and with vital signs stable. CT head without contrast was obtained and negative for acute intracranial abnormality. Chest x-ray revealed mild vascular congestion and mild peribronchial thickening but the airways were otherwise clear. Initial blood work returned notable for a slight bump in her serum creatinine and an approximate 2 g drop in hemoglobin from 3 weeks ago. FOBT was negative and there is no sign of active bleed.   1. Acute encephalopathy, unknown etiology ; might be worsening of dementia, vs delirium Has dementia, Alzheimer's with behavior disturbance.  - CT head with no acute intracranial abnormality  - Electrolytes stable  - UA negative  -TSH 1.377, ammonia 11, B-12 pending, folate, RPR  - Holding Neurontin  -MRI negative, ABG negative for hypercapnia. .  -Continue with  IV fluids.  -Neurology consulted.  Patient per husband has good days, other days she is alter, sleepy, per husband. Also discussed with daughter patient has been admitted in the past to psychiatrist facility for behavior problems.  I will also consult psych in am.  I reduce  dose of navane.   2. Insulin-dependent DM  - Last A1c 8.2% in 2014  - CBG with meals and qHS, q4h if not eating  - Hold Insulin  - Update A1c, pending   3. Hypertension  - Hold oral medications,  -IV hydralazine.   4. CKD stage III  - SCr 1.49 on arrival, up from apparent baseline of 1.3  - Daily chem panel   5. Normocytic anemia  - Hgb 8.7 on admission, down from apparent baseline of 10.5  - Was 10.5 on 08/03/15  - FOBT is neg  - No sign of active bleed  - Continuing pharmacologic VTE ppx with caution  - Trend; consider transfusing for Hgb <7   6-Abdominal Pain; protonix. Check KUB  Code Status: Full Code.  Family Communication: Husband at bedside. And daughter  Disposition Plan: remain inpatient for treatment and work up of encephalopathy.    Consultants:  Neurology  Procedures:  none  Antibiotics:  none  HPI/Subjective: Patient is more alert. Say few words.  Complaining of abdominal pain She gets restless.   Objective: Filed Vitals:   08/22/15 2212 08/23/15 0435  BP: 142/50 151/62  Pulse: 81 86  Temp: 98.1 F (36.7 C) 98.4 F (36.9 C)  Resp: 20 18    Intake/Output Summary (Last 24 hours) at 08/23/15 1040 Last data filed at 08/22/15 1700  Gross per 24 hour  Intake      0 ml  Output      1 ml  Net     -1 ml   Filed Weights   08/22/15 0600  Weight: 52.164 kg (115 lb)    Exam:   General:  more alert.   Cardiovascular: S 1, S 2 RRR  Respiratory: Bilateral air movement, coarse sounds.  Abdomen: BS presents, soft nt  Musculoskeletal: no edema  Data Reviewed: Basic Metabolic Panel:  Recent Labs Lab 08/22/15 0103 08/23/15 0740  NA 141 140  K 4.8 4.0  CL 104 104  CO2 28 27  GLUCOSE 136* 166*  BUN 30* 17  CREATININE 1.49* 1.20*  CALCIUM 9.0 8.8*   Liver Function Tests:  Recent Labs Lab 08/22/15 0103  AST 19  ALT 17  ALKPHOS 97  BILITOT 0.6  PROT 6.6  ALBUMIN 3.1*   No results for input(s): LIPASE,  AMYLASE in the last 168 hours.  Recent Labs Lab 08/22/15 0103  AMMONIA 11   CBC:  Recent Labs Lab 08/22/15 0103 08/22/15 1429 08/23/15 0740  WBC 7.7 4.5 3.1*  NEUTROABS 6.8  --   --   HGB 8.7* 9.1* 9.1*  HCT 27.9* 28.8* 28.5*  MCV 98.6 96.0 95.0  PLT 198 193 191   Cardiac Enzymes:  Recent Labs Lab 08/22/15 0418  TROPONINI <0.03   BNP (last 3 results)  Recent Labs  12/24/14 2035  BNP 41.2    ProBNP (last 3 results) No results for input(s): PROBNP in the last 8760 hours.  CBG:  Recent Labs Lab 08/22/15 0801 08/22/15 1312 08/22/15 1731 08/22/15 2217 08/23/15 0747  GLUCAP 121* 125* 124* 155* 156*    No results found for this or any previous visit (from the past 240 hour(s)).   Studies: Ct Head Wo Contrast  08/22/2015  CLINICAL DATA:  Acute onset of altered mental status. Initial encounter. EXAM: CT HEAD WITHOUT CONTRAST TECHNIQUE: Contiguous axial images were obtained from the base of the skull through the vertex without intravenous contrast. COMPARISON:  CT of the head performed 08/16/2015 FINDINGS: There is no evidence of acute infarction, mass lesion, or intra- or extra-axial hemorrhage on CT. Periventricular white matter change likely reflects small vessel ischemic microangiopathy. The posterior fossa, including the cerebellum, brainstem and fourth ventricle, is within normal limits. The third and lateral ventricles, and basal ganglia are unremarkable in appearance. The cerebral hemispheres are symmetric in appearance, with normal gray-white differentiation. No mass effect or midline shift is seen. There is no evidence of fracture; visualized osseous structures are unremarkable in appearance. The orbits are within normal limits. The paranasal sinuses and mastoid air cells are well-aerated. No significant soft tissue abnormalities are seen. IMPRESSION: 1. No acute intracranial pathology seen on CT. 2. Mild small vessel ischemic microangiopathy. Electronically  Signed   By: Garald Balding M.D.   On: 08/22/2015 00:55   Mr Brain Wo Contrast  08/22/2015  CLINICAL DATA:  82 year old diabetic hypertensive female with chronic kidney disease and Alzheimer's presenting with decreased level of consciousness. Subsequent encounter. EXAM: MRI HEAD WITHOUT CONTRAST TECHNIQUE: Multiplanar, multiecho pulse sequences of the brain and surrounding structures were obtained without intravenous contrast. COMPARISON:  08/22/2015 CT. FINDINGS: Exam is motion degraded. No acute infarct or intracranial hemorrhage. Mild chronic small vessel disease changes. Global mild atrophy without hydrocephalus. No intracranial mass lesion noted on this unenhanced exam. Major intracranial vascular structures are patent. Post lens replacement otherwise orbital structures unremarkable. Mild cervical spondylotic changes upper cervical spine. Minimal mucosal thickening inferior right maxillary sinus. IMPRESSION: Exam is motion degraded. No acute infarct or intracranial hemorrhage. Mild chronic small vessel disease changes. Global mild atrophy without hydrocephalus. No intracranial mass lesion noted on this unenhanced exam. Electronically Signed   By: Alcide Evener.D.  On: 08/22/2015 13:22   Dg Chest Port 1 View  08/22/2015  CLINICAL DATA:  Altered mental status.  Initial encounter. EXAM: PORTABLE CHEST 1 VIEW COMPARISON:  Chest radiograph performed 08/03/2015 FINDINGS: The lungs are well-aerated. Mild vascular congestion is noted. Mild peribronchial thickening is seen. There is no evidence of focal opacification, pleural effusion or pneumothorax. An apparent small calcification is noted overlying the left lower lung zone. The cardiomediastinal silhouette is within normal limits. No acute osseous abnormalities are seen. There is chronic superior subluxation of the right humeral head, with underlying degenerative change. IMPRESSION: Mild vascular congestion noted. Mild peribronchial thickening seen. Lungs  otherwise grossly clear. Electronically Signed   By: Garald Balding M.D.   On: 08/22/2015 00:54    Scheduled Meds: . feeding supplement (ENSURE ENLIVE)  237 mL Oral TID WC  . heparin  5,000 Units Subcutaneous 3 times per day  . multivitamin with minerals  1 tablet Oral Daily  . pantoprazole (PROTONIX) IV  40 mg Intravenous Q12H  . rosuvastatin  20 mg Oral Daily  . thiothixene  1 mg Oral BID   Continuous Infusions: . dextrose 5 % and 0.45% NaCl 75 mL/hr (08/22/15 1507)    Principal Problem:   Altered mental status Active Problems:   Insulin dependent diabetes mellitus (HCC)   HTN (hypertension)   CKD (chronic kidney disease) stage 3, GFR 30-59 ml/min   Dementia of Alzheimer's type with behavioral disturbance   Acute encephalopathy   Normochromic normocytic anemia    Time spent: 35 minutes.     Niel Hummer A  [facility] Pager [phone]. If 7PM-7AM, please contact night-coverage at [email or web address], password [facility] 08/23/2015, 10:40 AM  LOS: 1 day

## 2015-08-24 ENCOUNTER — Inpatient Hospital Stay (HOSPITAL_COMMUNITY): Payer: Medicare Other

## 2015-08-24 ENCOUNTER — Inpatient Hospital Stay (HOSPITAL_COMMUNITY)
Admit: 2015-08-24 | Discharge: 2015-08-24 | Disposition: A | Discharge status: Home / Self Care | Payer: Medicare Other | Attending: Internal Medicine | Admitting: Internal Medicine

## 2015-08-24 DIAGNOSIS — F0281 Dementia in other diseases classified elsewhere with behavioral disturbance: Secondary | ICD-10-CM

## 2015-08-24 DIAGNOSIS — E440 Moderate protein-calorie malnutrition: Secondary | ICD-10-CM | POA: Insufficient documentation

## 2015-08-24 DIAGNOSIS — G9340 Encephalopathy, unspecified: Secondary | ICD-10-CM

## 2015-08-24 DIAGNOSIS — R410 Disorientation, unspecified: Secondary | ICD-10-CM

## 2015-08-24 LAB — CBC
HEMATOCRIT: 27.1 % — AB (ref 36.0–46.0)
Hemoglobin: 8.6 g/dL — ABNORMAL LOW (ref 12.0–15.0)
MCH: 30.5 pg (ref 26.0–34.0)
MCHC: 31.7 g/dL (ref 30.0–36.0)
MCV: 96.1 fL (ref 78.0–100.0)
Platelets: 178 10*3/uL (ref 150–400)
RBC: 2.82 MIL/uL — ABNORMAL LOW (ref 3.87–5.11)
RDW: 13.9 % (ref 11.5–15.5)
WBC: 2.5 10*3/uL — ABNORMAL LOW (ref 4.0–10.5)

## 2015-08-24 LAB — BASIC METABOLIC PANEL
Anion gap: 12 (ref 5–15)
BUN: 18 mg/dL (ref 6–20)
CALCIUM: 8.5 mg/dL — AB (ref 8.9–10.3)
CO2: 24 mmol/L (ref 22–32)
CREATININE: 1.11 mg/dL — AB (ref 0.44–1.00)
Chloride: 104 mmol/L (ref 101–111)
GFR calc non Af Amer: 45 mL/min — ABNORMAL LOW (ref >60)
GFR, EST AFRICAN AMERICAN: 52 mL/min — AB (ref >60)
GLUCOSE: 172 mg/dL — AB (ref 65–99)
Potassium: 3.9 mmol/L (ref 3.5–5.1)
Sodium: 140 mmol/L (ref 135–145)

## 2015-08-24 LAB — GLUCOSE, CAPILLARY
GLUCOSE-CAPILLARY: 175 mg/dL — AB (ref 65–99)
Glucose-Capillary: 191 mg/dL — ABNORMAL HIGH (ref 65–99)
Glucose-Capillary: 206 mg/dL — ABNORMAL HIGH (ref 65–99)
Glucose-Capillary: 138 mg/dL — ABNORMAL HIGH (ref 65–99)

## 2015-08-24 LAB — FOLATE RBC
FOLATE, HEMOLYSATE: 547.7 ng/mL
FOLATE, RBC: 1820 ng/mL (ref >498)
HEMATOCRIT: 30.1 % — AB (ref 34.0–46.6)

## 2015-08-24 MED ORDER — HALOPERIDOL LACTATE 5 MG/ML IJ SOLN
2.0000 mg | Freq: Once | INTRAMUSCULAR | Status: AC
Start: 2015-08-24 — End: 2015-08-24
  Administered 2015-08-24: 2 mg via INTRAVENOUS
  Filled 2015-08-24: qty 1

## 2015-08-24 MED ORDER — QUETIAPINE FUMARATE 25 MG PO TABS: 25.0000 mg | ORAL_TABLET | Freq: Two times a day (BID) | ORAL | Status: DC

## 2015-08-24 MED ORDER — HALOPERIDOL LACTATE 5 MG/ML IJ SOLN
3.0000 mg | Freq: Once | INTRAMUSCULAR | Status: AC
  Administered 2015-08-24: 3 mg via INTRAVENOUS
  Filled 2015-08-24: qty 1

## 2015-08-24 MED ORDER — QUETIAPINE FUMARATE 25 MG PO TABS
25.0000 mg | ORAL_TABLET | Freq: Two times a day (BID) | ORAL | Status: DC
  Administered 2015-08-24 – 2015-08-29 (×11): 25 mg via ORAL
  Filled 2015-08-24 (×12): qty 1

## 2015-08-24 NOTE — Evaluation (Signed)
Occupational Therapy Evaluation Patient Details Name: Kristina [name] MRN: [id number] DOB: 31-Aug-1930 Today's Date: 08/24/2015    History of Present Illness 82 y.o. female with PMH of insulin-dependent diabetes mellitus, hypertension, DVT, PE, chronic kidney disease stage III, and Alzheimer's dementia with behavioral disturbance who presents from her ALF with acute encephalopathy.     Clinical Impression   Pt admitted with acute encephalopathy. Pt currently with functional limitations due to the deficits listed below (see OT Problem List). Pt will benefit from skilled OT to increase their safety and independence with ADL and functional mobility for ADL to facilitate discharge to venue listed below.      Follow Up Recommendations  SNF    Equipment Recommendations  None recommended by OT       Precautions / Restrictions Precautions Precautions: Fall Restrictions Weight Bearing Restrictions: No      Mobility Bed Mobility Overal bed mobility: Needs Assistance;+2 for physical assistance Bed Mobility: Rolling Rolling: Mod assist       General bed mobility comments: pt positioned on L side s/p eating on back with HOB raised  Transfers Overall transfer level: Needs assistance Equipment used: 2 person hand held assist Transfers: Sit to/from [facility] Sit to Stand: Mod assist Stand pivot transfers: Mod assist       General transfer comment: did not perform    Balance Overall balance assessment: History of Falls                                          ADL Overall ADL's : Needs assistance/impaired Eating/Feeding: Total assistance;Bed level   Grooming: Maximal assistance;Bed level;Cueing for sequencing                                 General ADL Comments: husband reports pt was feeding self occasinally but would often have spoon nit chin rather than her mouth so she often received A.  Pt did wipe mouth with washcloth  with OT providiing VC (pt used R hand)               Pertinent Vitals/Pain Pain Assessment:  (does not appear in pain) Faces Pain Scale: No hurt     Hand Dominance     Extremity/Trunk Assessment Upper Extremity Assessment Upper Extremity Assessment: Generalized weakness   Lower Extremity Assessment Lower Extremity Assessment: Difficult to assess due to impaired cognition   Cervical / Trunk Assessment Cervical / Trunk Assessment: Normal   Communication Communication Communication: Other (comment) (limited communication)   Cognition Arousal/Alertness: Lethargic Behavior During Therapy: WFL for tasks assessed/performed Overall Cognitive Status: History of cognitive impairments - at baseline                                Home Living Family/patient expects to be discharged to:: Skilled nursing facility Living Arrangements:  (was at ALF)                                      Prior Functioning/Environment Level of Independence: Needs assistance        Comments: increased falls at ALF    OT Diagnosis: Generalized weakness;Cognitive deficits   OT Problem List: Decreased strength;Decreased activity  tolerance   OT Treatment/Interventions: Self-care/ADL training;DME and/or AE instruction;Therapeutic exercise;Balance training    OT Goals(Current goals can be found in the care plan section) Acute Rehab OT Goals OT Goal Formulation: With patient Time For Goal Achievement: 09/07/15 Potential to Achieve Goals: Good  OT Frequency: Min 2X/week   Barriers to D/C: Decreased caregiver support             End of Session Nurse Communication: Mobility status  Activity Tolerance: Patient limited by lethargy Patient left: in bed;with family/visitor present;with bed alarm set   Time: [id number] OT Time Calculation (min): 19 min Charges:  OT General Charges $OT Visit: 1 Procedure OT Evaluation $OT Eval Low Complexity: 1 Procedure G-Codes:     Betsy Pries 08/24/2015, 2:56 PM

## 2015-08-24 NOTE — Progress Notes (Signed)
Initial Nutrition Assessment  DOCUMENTATION CODES:   Non-severe (moderate) malnutrition in context of chronic illness  INTERVENTION:  - Continue Ensure Enlive po BID, each supplement provides 350 kcal and 20 grams of protein (be sure to mix with thickener as pt is on nectar-thick liquids) - Continue orders for Resource ThickenUp and Thick It - Will order Magic cup BID with meals, each supplement provides 290 kcal and 9 grams of protein - RD will continue to monitor for needs  NUTRITION DIAGNOSIS:   Unintentional weight loss related to chronic illness as evidenced by percent weight loss.  GOAL:   Patient will meet greater than or equal to 90% of their needs  MONITOR:   PO intake, Supplement acceptance, Weight trends, Labs, Skin, I & O's  REASON FOR ASSESSMENT:   Malnutrition Screening Tool  ASSESSMENT:   82 y.o. female with PMH of insulin-dependent diabetes mellitus, chronic kidney disease stage III, and Alzheimer's dementia with behavioral disturbance who presents from her SNF with a decreased level of consciousness. SNF personnel suspected hypoglycemia tonight when patient could not be roused and activated EMS. CBG was 81 on the scene and patient was reportedly very resistant to EMS attempts at care. Per report, the patient is typically verbal, though confused, and assists with her feeds. Prior to arrival, the patient was reportedly not opening her eyes or giving any response except withdrawing from pain. Per the SNF personnel, she had been in her usual state until the day of her presentation. There is reportedly been no recent change in her medications.   Pt seen for MST. BMI indicates normal weight. Per chart review, pt ate 0% of breakfast and lunch and 100% of dinner yesterday (1/22). Breakfast tray in room and is untouched. Pt sleeping at time of RD visit; noted hx of Alzheimer's dementia. No family/visitors present at time of visit to provide information from PTA. Per chart  notes, pt had previously stated that at baseline pt is sometimes able to feed herself. Pt currently has bilateral mitten restraints in place.  Physical assessment shows mild and moderate muscle wasting, no fat wasting, and mild edema to BLE. Per chart review, pt has lost 20 lbs (15% body weight) in the past 8 months which is significant for time frame.   Will monitor for need to switch from Ensure Enlive to Glucerna Shake dependent on CBGs. Not meeting needs. Medications reviewed. Labs reviewed; CBGs: 124-191 mg/dL, creatinine elevated but trending down, Ca: 8.5 mg/dL, GFR: 52.   Diet Order:  DIET - DYS 1 Room service appropriate?: Yes; Fluid consistency:: Nectar Thick  Skin:  Reviewed, no issues  Last BM:  PTA  Height:   Ht Readings from Last 1 Encounters:  08/22/15 5\' 5"  (1.651 m)    Weight:   Wt Readings from Last 1 Encounters:  08/22/15 115 lb (52.164 kg)    Ideal Body Weight:  56.82 kg (kg)  BMI:  Body mass index is 19.14 kg/(m^2).  Estimated Nutritional Needs:   Kcal:  1200-1400  Protein:  50-60 grams  Fluid:  >/= 1.6 L/day  EDUCATION NEEDS:   No education needs identified at this time     Jarome Matin, RD, LDN Inpatient Clinical Dietitian Pager # [phone] After hours/weekend pager # [phone]

## 2015-08-24 NOTE — Progress Notes (Signed)
EEG completed; results pending.    

## 2015-08-24 NOTE — Procedures (Signed)
History: 82 year old female with altered mental status  Sedation: Ativan around 8 hours prior to EEG  Technique: This is a 21 channel routine scalp EEG performed at the bedside with bipolar and monopolar montages arranged in accordance to the international 10/20 system of electrode placement. One channel was dedicated to EKG recording.   Background: The background consists predominantly of normal sleep. During a brief period of arousal, there is irregular generalized slow activity in addition to a posterior dominant rhythm of 6.5 Hz.   Photic stimulation: Physiologic driving is not performed  EEG Abnormalities: 1) Generalized irregular slow activity 2) slow PDER  Clinical Interpretation: This EEG is consistent with a generalized non-specific cerebral dysfunction(encephalopathy). There was no seizure or seizure predisposition recorded on this study.   Roland Rack, MD [facility] [phone]  If 7pm- 7am, please page neurology on call as listed in [place].

## 2015-08-24 NOTE — Progress Notes (Signed)
[facility] PROGRESS NOTE  Kristina [name] [id number] DOB: June 11, 1931 DOA: 08/21/2015 PCP: Rulon Eisenmenger, NP  Assessment/Plan: Kristina [name] is a 82 y.o. female with PMH of insulin-dependent diabetes mellitus, chronic kidney disease stage III, and Alzheimer's dementia with behavioral disturbance who presents from her SNF with a decreased level of consciousness. SNF personnel suspected hypoglycemia tonight when patient could not be roused and activated EMS. CBG was 81 on the scene and patient was reportedly very resistant to EMS attempts at care. Per report, the patient is typically verbal, though confused, and assists with her feeds. Prior to arrival, the patient was reportedly not opening her eyes or giving any response except withdrawing from pain.  In ED, patient was found to be afebrile, saturating well on room air, and with vital signs stable. CT head without contrast was obtained and negative for acute intracranial abnormality. Chest x-ray revealed mild vascular congestion and mild peribronchial thickening but the airways were otherwise clear. Initial blood work returned notable for a slight bump in her serum creatinine and an approximate 2 g drop in hemoglobin from 3 weeks ago. FOBT was negative and there is no sign of active bleed.   1. Acute encephalopathy, unknown etiology ; might be worsening of dementia, vs delirium Has dementia, Alzheimer's with behavior disturbance.  - CT head with no acute intracranial abnormality  - Electrolytes stable  - UA negative  -TSH 1.377, ammonia 11, B-12 1117, folate 547, RPR non- reactive.  - Holding Neurontin  -MRI negative, ABG negative for hypercapnia. .  -Continue with  IV fluids.  -Neurology consulted and following -EEG, negative for seizure, generalized non specific cerebral dysfunction, encephalopathy/  Appreciate Psych and neuro input. Will stop navane, started on Seroquel.  Repeat chest x rau after hydration to rule out  PNA.  For agitation would use haldol.   2. Insulin-dependent DM  - Last A1c 8.2% in 2014  - CBG with meals and qHS, q4h if not eating  - Hold Insulin  - Update A1c, P  3. Hypertension  - Hold oral medications,  -IV hydralazine.   4. CKD stage III  - SCr 1.49 on arrival, up from apparent baseline of 1.3  - Daily chem panel   5. Normocytic anemia  - Hgb 8.7 on admission, down from apparent baseline of 10.5  - FOBT is neg  - No sign of active bleed  - Continuing pharmacologic VTE ppx with caution  - Trend; consider transfusing for Hgb <7  Leukopenia; might be related to medications, like navane.   6-Abdominal Pain; protonix. KUB negative, abdominal exam benign.   Code Status: Full Code.  Family Communication: Husband at bedside. And daughter  Disposition Plan: remain inpatient for treatment and work up of encephalopathy.    Consultants:  Neurology  Procedures:  none  Antibiotics:  none  HPI/Subjective: Patient lethargic this am, suspect relate to medications navane , ativan.  Awake at night   Objective: Filed Vitals:   08/23/15 2239 08/24/15 0501  BP: 139/67 158/46  Pulse: 90 90  Temp: 97.3 F (36.3 C) 98.8 F (37.1 C)  Resp: 20 20    Intake/Output Summary (Last 24 hours) at 08/24/15 1536 Last data filed at 08/24/15 0600  Gross per 24 hour  Intake 1593.75 ml  Output      0 ml  Net 1593.75 ml   Filed Weights   08/22/15 0600  Weight: 52.164 kg (115 lb)    Exam:   General: more alert.   Cardiovascular:  S 1, S 2 RRR  Respiratory: Bilateral air movement, coarse sounds.  Abdomen: BS presents, soft nt  Musculoskeletal: no edema  Data Reviewed: Basic Metabolic Panel:  Recent Labs Lab 08/22/15 0103 08/23/15 0740 08/24/15 0452  NA 141 140 140  K 4.8 4.0 3.9  CL 104 104 104  CO2 28 27 24   GLUCOSE 136* 166* 172*  BUN 30* 17 18  CREATININE 1.49* 1.20* 1.11*  CALCIUM 9.0 8.8* 8.5*   Liver Function Tests:  Recent  Labs Lab 08/22/15 0103  AST 19  ALT 17  ALKPHOS 97  BILITOT 0.6  PROT 6.6  ALBUMIN 3.1*   No results for input(s): LIPASE, AMYLASE in the last 168 hours.  Recent Labs Lab 08/22/15 0103  AMMONIA 11   CBC:  Recent Labs Lab 08/22/15 0103 08/22/15 1429 08/23/15 0740 08/24/15 0452  WBC 7.7 4.5 3.1* 2.5*  NEUTROABS 6.8  --   --   --   HGB 8.7* 9.1* 9.1* 8.6*  HCT 27.9* 28.8* 28.5* 27.1*  MCV 98.6 96.0 95.0 96.1  PLT 198 193 191 178   Cardiac Enzymes:  Recent Labs Lab 08/22/15 0418  TROPONINI <0.03   BNP (last 3 results)  Recent Labs  12/24/14 2035  BNP 41.2    ProBNP (last 3 results) No results for input(s): PROBNP in the last 8760 hours.  CBG:  Recent Labs Lab 08/23/15 1141 08/23/15 1646 08/23/15 2244 08/24/15 0732 08/24/15 1148  GLUCAP 191* 164* 181* 191* 175*    No results found for this or any previous visit (from the past 240 hour(s)).   Studies: Dg Abd 1 View  08/23/2015  CLINICAL DATA:  Abdominal pain. EXAM: ABDOMEN - 1 VIEW COMPARISON:  CT of the abdomen pelvis 05/08/2013 FINDINGS: The bowel gas pattern is normal. There is moderate amount of stool throughout the colon. No evidence of organomegaly. No radio-opaque calculi or other significant radiographic abnormality are seen. Bridging enthesophyte is seen at L2-L3. IMPRESSION: Nonobstructive bowel gas pattern. Electronically Signed   By: Fidela Salisbury M.D.   On: 08/23/2015 16:42    Scheduled Meds: . feeding supplement (ENSURE ENLIVE)  237 mL Oral TID WC  . heparin  5,000 Units Subcutaneous 3 times per day  . multivitamin with minerals  1 tablet Oral Daily  . pantoprazole (PROTONIX) IV  40 mg Intravenous Q12H  . QUEtiapine  25 mg Oral BID  . rosuvastatin  20 mg Oral Daily   Continuous Infusions: . dextrose 5 % and 0.45% NaCl 75 mL/hr at 08/24/15 [id number]    Principal Problem:   Dementia of Alzheimer's type with behavioral disturbance Active Problems:   Insulin dependent diabetes  mellitus (HCC)   HTN (hypertension)   CKD (chronic kidney disease) stage 3, GFR 30-59 ml/min   Altered mental status   Acute encephalopathy   Normochromic normocytic anemia   Malnutrition of moderate degree    Time spent: 25 minutes.     Kristina [name] A  [facility] Pager [phone]. If 7PM-7AM, please contact night-coverage at [email or web address], password [facility] 08/24/2015, 3:36 PM  LOS: 2 days

## 2015-08-24 NOTE — Progress Notes (Signed)
CSW continuing to follow.   PT/OT recommending SNF.   CSW met with pt husband at bedside. Pt husband discussed that pt admitted from [facility] ALF memory care unit and ALF memory care has been attempting to place pt in a higher level of care at [facility], but have not yet been successful in locating a SNF that can meet pt needs. Pt husband provided permission for CSW to contact pt daughter as pt daughter has been involved in discussion with [facility].   CSW contacted [facility] and left voice message.  CSW spoke with pt daughter via telephone. Pt daughter reports that pt was placed in [place] [facility] memory care unit following an 80 day stay in [facility]. Pt daughter discussed that [facility] has been working on placement to a higher level of care at [facility], but has not located a facility that can meet pt needs. Pt daughter reports that pt will need a locked memory care unit as pt has a history of wandering. Pt daughter is agreeable to CSW doing an expansive SNF search to determine if there is a facility willing to offer pt placement. Pt daughter feels that [place] would likely not accept pt back if SNF bed not located. CSW discussed that CSW contacted facility and left voice message to discuss. Pt daughter reports that pt had been offered a bed at [facility] in [facility] in the past and pt family did not accept bed as they feared pt would not thrive in facility. Pt daughter aware of pt special needs and hopeful that there will be more success in locating SNF from hospital, but this has proven to be difficult in the past per pt daughter.   CSW completed FL2 and did expansive SNF search.  CSW to follow up with pt family re: SNF bed offers.  CSW to continue to follow.  Alison Murray, MSW, LCSW Clinical Social Work Coverage for [employer], [place] [phone]

## 2015-08-24 NOTE — NC FL2 (Signed)
[place] LEVEL OF CARE SCREENING TOOL     IDENTIFICATION  Patient Name: Kristina [name] Birthdate: 1931/01/27 Sex: female Admission Date (Current Location): 08/21/2015  [place] and [place] Number:  Kathleen Argue [id number] [place] and Address:  [facility],  [place] [address] [place]      Provider Number: [id number]  Attending Physician Name and Address:  Elmarie Shiley, MD  Relative Name and Phone Number:       Current Level of Care: Hospital Recommended Level of Care: [place] Prior Approval Number:    Date Approved/Denied:   PASRR Number:    Discharge Plan: SNF    Current Diagnoses: Patient Active Problem List   Diagnosis Date Noted  . Malnutrition of moderate degree 08/24/2015  . Altered mental status 08/22/2015  . Acute encephalopathy 08/22/2015  . Normochromic normocytic anemia   . 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 Dementia of Alzheimer's type with behavioral disturbance 08/05/2015  . Alzheimer's dementia 11/14/2014  . Depression 11/14/2014  . Pure hypercholesterolemia 03/01/2013  . DVT, lower extremity ([place]) 10/19/2012  . Dyspnea 10/15/2012  . Insulin dependent diabetes mellitus ([place]) 10/15/2012  . HTN (hypertension) 10/15/2012  . PE (pulmonary embolism) 10/15/2012  . CKD (chronic kidney disease) stage 3, GFR 30-59 ml/min 10/15/2012    Orientation RESPIRATION BLADDER Height & Weight    Self  Normal Incontinent 5\' 5"  (165.1 cm) 115 lbs.  BEHAVIORAL SYMPTOMS/MOOD NEUROLOGICAL BOWEL NUTRITION STATUS  Other (Comment) (dementia with behavioral disturbances, admitted from a memory care assisted living facility)   Incontinent Diet (DYS 1 Room service appropriate; Fluid consistency: Nectar Thick)  AMBULATORY STATUS COMMUNICATION OF NEEDS Skin   Extensive Assist  (incomprehensible sounds) Normal                       Personal Care Assistance  Level of Assistance  Bathing, Feeding, Dressing Bathing Assistance: Maximum assistance Feeding assistance: Limited assistance Dressing Assistance: Maximum assistance Total Care Assistance: Maximum assistance   Functional Limitations Info  Speech, Sight, Hearing Sight Info: Adequate Hearing Info: Adequate Speech Info: Impaired    SPECIAL CARE FACTORS FREQUENCY  PT (By licensed PT), OT (By licensed OT)     PT Frequency: 3x week OT Frequency: 2x week            Contractures Contractures Info: Not present    Additional Factors Info  Insulin Sliding Scale Code Status Info: FULL Allergies Info: Cortisone, Oxycodone, PCNS   Insulin Sliding Scale Info: Insulin dependent DM       Current Medications (08/24/2015):  This is the current hospital active medication list Current Facility-Administered Medications  Medication Dose Route Frequency Provider Last Rate Last Dose  . acetaminophen (OFIRMEV) IV 1,000 mg  1,000 mg Intravenous Q8H PRN Belkys A Regalado, MD   1,000 mg at 08/24/15 0702  . acetaminophen (TYLENOL) tablet 650 mg  650 mg Oral Q6H PRN Ilene Qua Opyd, MD      . dextrose 5 %-0.45 % sodium chloride infusion   Intravenous Continuous Belkys A Regalado, MD 75 mL/hr at 08/24/15 0728    . feeding supplement (ENSURE ENLIVE) (ENSURE ENLIVE) liquid 237 mL  237 mL Oral TID WC Ilene Qua Opyd, MD   237 mL at 08/23/15 1715  . food thickener (THICK IT) powder 1 g  1 g Oral PRN Belkys A Regalado, MD      . heparin injection 5,000 Units  5,000 Units  Subcutaneous 3 times per day Vianne Bulls, MD   5,000 Units at 08/24/15 1324  . hydrALAZINE (APRESOLINE) injection 10 mg  10 mg Intravenous Q8H PRN Belkys A Regalado, MD      . multivitamin with minerals tablet 1 tablet  1 tablet Oral Daily Vianne Bulls, MD   1 tablet at 08/24/15 1014  . pantoprazole (PROTONIX) injection 40 mg  40 mg Intravenous Q12H Belkys A Regalado, MD   40 mg at 08/24/15 1014  . QUEtiapine (SEROQUEL) tablet 25 mg  25  mg Oral BID Belkys A Regalado, MD      . RESOURCE THICKENUP CLEAR   Oral PRN Belkys A Regalado, MD      . rosuvastatin (CRESTOR) tablet 20 mg  20 mg Oral Daily Vianne Bulls, MD   20 mg at 08/23/15 1715  . senna (SENOKOT) tablet 8.6 mg  1 tablet Oral Daily PRN Vianne Bulls, MD         Discharge Medications: Please see discharge summary for a list of discharge medications.  Relevant Imaging Results:  Relevant Lab Results:   Additional Information SSN: [id number]  KIDD, SUZANNA A, LCSW

## 2015-08-24 NOTE — Evaluation (Signed)
Physical Therapy Evaluation Patient Details Name: ABBEGALE ROUGEAU MRN: [id number] DOB: [birth year] Today's Date: 08/24/2015   History of Present Illness  82 y.o. female with PMH of insulin-dependent diabetes mellitus, hypertension, DVT, PE, chronic kidney disease stage III, and Alzheimer's dementia with behavioral disturbance who presents from her ALF with acute encephalopathy.    Clinical Impression  Pt admitted with above diagnosis. Pt currently with functional limitations due to the deficits listed below (see PT Problem List).  Pt will benefit from skilled PT to increase their independence and safety with mobility to allow discharge to the venue listed below.  Pt's lethargy limiting evaluation however was evaluated earlier this month by PT upon prior admission and 24/7 assist recommended - likely more custodial care needs.     Follow Up Recommendations No PT follow up;Supervision/Assistance - 24 hour (likely requires custodial care (also per previous admission PT recommendation) - SNF)    Equipment Recommendations  None recommended by PT    Recommendations for Other Services       Precautions / Restrictions Precautions Precautions: Fall      Mobility  Bed Mobility Overal bed mobility: Needs Assistance;+2 for physical assistance Bed Mobility: Supine to Sit     Supine to sit: Max assist;+2 for physical assistance     General bed mobility comments: RN assisted with upper body and PT assisted lower body and hips to EOB utilizing bed pad, pt slightly assisting, not resistive  Transfers Overall transfer level: Needs assistance Equipment used: 2 person hand held assist Transfers: Sit to/from [facility] Sit to Stand: Mod assist Stand pivot transfers: Mod assist       General transfer comment: verbal cues for technique, mostly kept eyes closed, attempting to assist movement, requires increased time to process/intiate  Ambulation/Gait Ambulation/Gait  assistance:  (deferred today due to lethargy)              Stairs            Wheelchair Mobility    Modified Rankin (Stroke Patients Only)       Balance Overall balance assessment: History of Falls                                           Pertinent Vitals/Pain Pain Assessment:  (does not appear in pain)    Home Living Family/patient expects to be discharged to:: Assisted living                      Prior Function Level of Independence: Needs assistance         Comments: increased falls at ALF     Hand Dominance        Extremity/Trunk Assessment   Upper Extremity Assessment: Difficult to assess due to impaired cognition           Lower Extremity Assessment: Difficult to assess due to impaired cognition      Cervical / Trunk Assessment: Normal  Communication   Communication: Other (comment) (limited communication, lethargic)  Cognition Arousal/Alertness: Lethargic   Overall Cognitive Status:  (hx of cognitive impairments, however very lethargic )                      General Comments      Exercises        Assessment/Plan    PT Assessment Patient needs continued PT  services  PT Diagnosis Difficulty walking   PT Problem List Decreased strength;Decreased activity tolerance;Decreased mobility;Decreased cognition;Decreased balance  PT Treatment Interventions DME instruction;Gait training;Functional mobility training;Patient/family education;Therapeutic activities;Therapeutic exercise;Balance training   PT Goals (Current goals can be found in the Care Plan section) Acute Rehab PT Goals PT Goal Formulation: Patient unable to participate in goal setting Time For Goal Achievement: 08/31/15 Potential to Achieve Goals: Fair    Frequency Min 1X/week   Barriers to discharge        Co-evaluation               End of Session   Activity Tolerance: Patient limited by lethargy Patient left: in  chair;with call bell/phone within reach;with nursing/sitter in room;with chair alarm set           Time: 1003-1013 PT Time Calculation (min) (ACUTE ONLY): 10 min   Charges:   PT Evaluation $PT Eval Low Complexity: 1 Procedure     PT G Codes:        Jaki Steptoe,KATHrine E 08/24/2015, 12:33 PM Carmelia Bake, PT, DPT 08/24/2015 Pager: [phone]

## 2015-08-24 NOTE — Consult Note (Signed)
[facility] Face-to-Face Psychiatry Consult   Reason for Consult:  AMS Referring Physician:  Dr. Tyrell Antonio Patient Identification: Kristina [name] MRN:  [id number] Principal Diagnosis: Dementia of Alzheimer's type with behavioral disturbance Diagnosis:   Patient Active Problem List   Diagnosis Date Noted  . Altered mental status [R41.82] 08/22/2015  . Acute encephalopathy [G93.40] 08/22/2015  . Normochromic normocytic anemia [D64.9]   . Dementia in Alzheimer's disease with early onset with behavioral disturbance [G30.0, F02.81] 08/05/2015  . Dementia of Alzheimer's type with behavioral disturbance [G30.8] 08/05/2015  . Alzheimer's dementia [G30.9, F02.80] 11/14/2014  . Depression [F32.9] 11/14/2014  . Pure hypercholesterolemia [E78.00] 03/01/2013  . DVT, lower extremity ([place]) [I82.409] 10/19/2012  . Dyspnea [R06.00] 10/15/2012  . Insulin dependent diabetes mellitus ([place]) [E11.9, Z79.4] 10/15/2012  . HTN (hypertension) [I10] 10/15/2012  . PE (pulmonary embolism) [I26.99] 10/15/2012  . CKD (chronic kidney disease) stage 3, GFR 30-59 ml/min [N18.3] 10/15/2012    Total Time spent with patient: 1 hour  Subjective:   Kristina [name] is a 82 y.o. female patient admitted with AMS.  HPI:  Kristina [name] is a 82 y.o. female with PMH of insulin-dependent diabetes mellitus, chronic kidney disease stage III, and Alzheimer's dementia with behavioral disturbance who presents from her SNF with a decreased level of consciousness. Patient seen, chart reviewed for face-to-face psychiatric consultation and evaluation of psychiatric medication management for patients with a dementia along with a behavioral problems. Patient is a poor historian and unable to contribute verbally to the history and evaluation. Information for this evaluation obtained from mostly reviewing electronic medical records and case discussed with staff RN. Per report, the patient is typically verbal, though confused, and assists with her feeds.  Prior to arrival, the patient was reportedly not opening her eyes or giving any response except withdrawing from pain. Per the SNF personnel, she had been in her usual state until the day of her presentation. There is reportedly been no recent change in her medications. Patient was previously evaluated at [facility] 08/07/2015 for increased symptoms of depression and behavioral problems but does not required inpatient hospitalization.   Medical history: patient was found to be afebrile, saturating well on room air, and with vital signs stable. CT head without contrast was obtained and negative for acute intracranial abnormality. Chest x-ray revealed mild vascular congestion and mild peribronchial thickening but the airways were otherwise clear. Initial blood work returned notable for a slight bump in her serum creatinine and an approximate 2 g drop in hemoglobin from 3 weeks ago. FOBT was negative and there is no sign of active bleed. UA was unremarkable.   Past Psychiatric History: Dementia with behavioral problems and depression  Risk to Self: Is patient at risk for suicide?: No Risk to Others:   Prior Inpatient Therapy:   Prior Outpatient Therapy:    Past Medical History:  Past Medical History  Diagnosis Date  . Diabetes mellitus   . Hypertension   . Renal disorder   . Cancer ([place]) 2000    sp bilateral masectomy, sp chemo  . DVT (deep venous thrombosis) ([place])   . PE (pulmonary embolism)   . Dementia   . GERD (gastroesophageal reflux disease)   . Dehydration   . Hypokalemia   . GERD (gastroesophageal reflux disease)   . Fracture of ramus of mandible ([place])   . Hyperlipidemia   . Alzheimer disease   . Chronic kidney disease (CKD), stage III (moderate)     Past Surgical History  Procedure Laterality  Date  . Breast surgery    . Mastecomy     Family History:  Family History  Problem Relation Age of Onset  . Hypertension Mother   . Diabetes Mellitus II Father    Family Psychiatric   History: Unknown Social History:  History  Alcohol Use No     History  Drug Use No    Social History   Social History  . Marital Status: Married    Spouse Name: N/A  . Number of Children: N/A  . Years of Education: N/A   Social History Main Topics  . Smoking status: Never Smoker   . Smokeless tobacco: None  . Alcohol Use: No  . Drug Use: No  . Sexual Activity: Not Asked   Other Topics Concern  . None   Social History Narrative   Additional Social History:                          Allergies:   Allergies  Allergen Reactions  . Cortisone Other (See Comments)    Injection-altered mental status, didn't know her name, where she lived, etc.     . Oxycodone Other (See Comments)    Caused patient to go crazy  . Penicillins Rash    Per MAR     Labs:  Results for orders placed or performed during the hospital encounter of 08/21/15 (from the past 48 hour(s))  Glucose, capillary     Status: Abnormal   Collection Time: 08/22/15  1:12 PM  Result Value Ref Range   Glucose-Capillary 125 (H) 65 - 99 mg/dL  CBC     Status: Abnormal   Collection Time: 08/22/15  2:29 PM  Result Value Ref Range   WBC 4.5 4.0 - 10.5 K/uL   RBC 3.00 (L) 3.87 - 5.11 MIL/uL   Hemoglobin 9.1 (L) 12.0 - 15.0 g/dL   HCT 28.8 (L) 36.0 - 46.0 %   MCV 96.0 78.0 - 100.0 fL   MCH 30.3 26.0 - 34.0 pg   MCHC 31.6 30.0 - 36.0 g/dL   RDW 14.2 11.5 - 15.5 %   Platelets 193 150 - 400 K/uL  Glucose, capillary     Status: Abnormal   Collection Time: 08/22/15  5:31 PM  Result Value Ref Range   Glucose-Capillary 124 (H) 65 - 99 mg/dL  Glucose, capillary     Status: Abnormal   Collection Time: 08/22/15 10:17 PM  Result Value Ref Range   Glucose-Capillary 155 (H) 65 - 99 mg/dL  CBC     Status: Abnormal   Collection Time: 08/23/15  7:40 AM  Result Value Ref Range   WBC 3.1 (L) 4.0 - 10.5 K/uL   RBC 3.00 (L) 3.87 - 5.11 MIL/uL   Hemoglobin 9.1 (L) 12.0 - 15.0 g/dL   HCT 28.5 (L) 36.0 - 46.0 %    MCV 95.0 78.0 - 100.0 fL   MCH 30.3 26.0 - 34.0 pg   MCHC 31.9 30.0 - 36.0 g/dL   RDW 14.0 11.5 - 15.5 %   Platelets 191 150 - 400 K/uL  Basic metabolic panel     Status: Abnormal   Collection Time: 08/23/15  7:40 AM  Result Value Ref Range   Sodium 140 135 - 145 mmol/L   Potassium 4.0 3.5 - 5.1 mmol/L   Chloride 104 101 - 111 mmol/L   CO2 27 22 - 32 mmol/L   Glucose, Bld 166 (H) 65 - 99 mg/dL  BUN 17 6 - 20 mg/dL   Creatinine, Ser 1.20 (H) 0.44 - 1.00 mg/dL   Calcium 8.8 (L) 8.9 - 10.3 mg/dL   GFR calc non Af Amer 41 (L) >60 mL/min   GFR calc Af Amer 47 (L) >60 mL/min    Comment: (NOTE) The eGFR has been calculated using the CKD EPI equation. This calculation has not been validated in all clinical situations. eGFR's persistently <60 mL/min signify possible Chronic Kidney Disease.    Anion gap 9 5 - 15  Glucose, capillary     Status: Abnormal   Collection Time: 08/23/15  7:47 AM  Result Value Ref Range   Glucose-Capillary 156 (H) 65 - 99 mg/dL  Glucose, capillary     Status: Abnormal   Collection Time: 08/23/15 11:41 AM  Result Value Ref Range   Glucose-Capillary 191 (H) 65 - 99 mg/dL  Glucose, capillary     Status: Abnormal   Collection Time: 08/23/15  4:46 PM  Result Value Ref Range   Glucose-Capillary 164 (H) 65 - 99 mg/dL  Glucose, capillary     Status: Abnormal   Collection Time: 08/23/15 10:44 PM  Result Value Ref Range   Glucose-Capillary 181 (H) 65 - 99 mg/dL  CBC     Status: Abnormal   Collection Time: 08/24/15  4:52 AM  Result Value Ref Range   WBC 2.5 (L) 4.0 - 10.5 K/uL   RBC 2.82 (L) 3.87 - 5.11 MIL/uL   Hemoglobin 8.6 (L) 12.0 - 15.0 g/dL   HCT 27.1 (L) 36.0 - 46.0 %   MCV 96.1 78.0 - 100.0 fL   MCH 30.5 26.0 - 34.0 pg   MCHC 31.7 30.0 - 36.0 g/dL   RDW 13.9 11.5 - 15.5 %   Platelets 178 150 - 400 K/uL  Basic metabolic panel     Status: Abnormal   Collection Time: 08/24/15  4:52 AM  Result Value Ref Range   Sodium 140 135 - 145 mmol/L    Potassium 3.9 3.5 - 5.1 mmol/L   Chloride 104 101 - 111 mmol/L   CO2 24 22 - 32 mmol/L   Glucose, Bld 172 (H) 65 - 99 mg/dL   BUN 18 6 - 20 mg/dL   Creatinine, Ser 1.11 (H) 0.44 - 1.00 mg/dL   Calcium 8.5 (L) 8.9 - 10.3 mg/dL   GFR calc non Af Amer 45 (L) >60 mL/min   GFR calc Af Amer 52 (L) >60 mL/min    Comment: (NOTE) The eGFR has been calculated using the CKD EPI equation. This calculation has not been validated in all clinical situations. eGFR's persistently <60 mL/min signify possible Chronic Kidney Disease.    Anion gap 12 5 - 15  Glucose, capillary     Status: Abnormal   Collection Time: 08/24/15  7:32 AM  Result Value Ref Range   Glucose-Capillary 191 (H) 65 - 99 mg/dL    Current Facility-Administered Medications  Medication Dose Route Frequency Provider Last Rate Last Dose  . acetaminophen (OFIRMEV) IV 1,000 mg  1,000 mg Intravenous Q8H PRN Belkys A Regalado, MD   1,000 mg at 08/24/15 0702  . acetaminophen (TYLENOL) tablet 650 mg  650 mg Oral Q6H PRN Ilene Qua Opyd, MD      . dextrose 5 %-0.45 % sodium chloride infusion   Intravenous Continuous Belkys A Regalado, MD 75 mL/hr at 08/24/15 0728    . feeding supplement (ENSURE ENLIVE) (ENSURE ENLIVE) liquid 237 mL  237 mL Oral TID WC Ilene Qua  Opyd, MD   237 mL at 08/23/15 1715  . food thickener (THICK IT) powder 1 g  1 g Oral PRN Belkys A Regalado, MD      . heparin injection 5,000 Units  5,000 Units Subcutaneous 3 times per day Vianne Bulls, MD   5,000 Units at 08/24/15 0500  . hydrALAZINE (APRESOLINE) injection 10 mg  10 mg Intravenous Q8H PRN Belkys A Regalado, MD      . multivitamin with minerals tablet 1 tablet  1 tablet Oral Daily Vianne Bulls, MD   1 tablet at 08/24/15 1014  . pantoprazole (PROTONIX) injection 40 mg  40 mg Intravenous Q12H Belkys A Regalado, MD   40 mg at 08/24/15 1014  . RESOURCE THICKENUP CLEAR   Oral PRN Belkys A Regalado, MD      . rosuvastatin (CRESTOR) tablet 20 mg  20 mg Oral Daily Vianne Bulls, MD   20 mg at 08/23/15 1715  . senna (SENOKOT) tablet 8.6 mg  1 tablet Oral Daily PRN Vianne Bulls, MD      . thiothixene (NAVANE) capsule 1 mg  1 mg Oral BID Belkys A Regalado, MD   1 mg at 08/24/15 1014    Musculoskeletal: Strength & Muscle Tone: decreased Gait & Station: unable to stand Patient leans: N/A  Psychiatric Specialty Exam: ROS unable to complete due to current mental status   Blood pressure 158/46, pulse 90, temperature 98.8 F (37.1 C), temperature source Axillary, resp. rate 20, height _0  (1.651 m), weight 52.164 kg (115 lb), SpO2 100 %.Body mass index is 19.14 kg/(m^2).  General Appearance: Guarded  Eye Contact::  Poor  Speech:  NA  Volume:  Not applicable  Mood:  NA  Affect:  NA  Thought Process:  NA  Orientation:  NA  Thought Content:  NA  Suicidal Thoughts:  No  Homicidal Thoughts:  No  Memory:  NA  Judgement:  NA  Insight:  NA  Psychomotor Activity:  Psychomotor Retardation  Concentration:  Poor  Recall:  Poor  Fund of Knowledge:NA  Language: NA  Akathisia:  NA  Handed:  Right  AIMS (if indicated):     Assets:  Others:  Secondary to patient has been mute  ADL's:  Impaired  Cognition: Impaired,  Severe  Sleep:      Treatment Plan Summary: Daily contact with patient to assess and evaluate symptoms and progress in treatment and Medication management Patient has no self-injurious behaviors status suicidal or homicidal ideations  Patient may need safety sitter for fall prevention  We will provide Seroquel 25 mg at 4 PM and 8 PM to prevent sundown syndrome/agitation Continue initiation for possible encephalopathy Appreciate psychiatric consultation and follow up as clinically required Please contact [phone] or [phone] if needs further assistance   Disposition: No evidence of imminent risk to self or others at present.   Supportive therapy provided about ongoing stressors.  Asharia Lotter,JANARDHAHA R. 08/24/2015 10:38 AM

## 2015-08-24 NOTE — Progress Notes (Signed)
Patient continue to be very restless, will not stay in bed/chair, very unsteady, needing constant reinforcement. No new order given, will continue to monitor.

## 2015-08-24 NOTE — Progress Notes (Signed)
Subjective: Patient was more alert yesterday her name, she became agitated in the evening and received multiple doses of Ativan, most recently around 1 AM.  Exam: Filed Vitals:   08/23/15 2239 08/24/15 0501  BP: 139/67 158/46  Pulse: 90 90  Temp: 97.3 F (36.3 C) 98.8 F (37.1 C)  Resp: 20 20   Gen: In bed, NAD Resp: non-labored breathing, no acute distress Abd: soft, nt  Neuro: MS: Lethargic, but with stimulation well open eyes and answer simple questions such as "in the hospital" to location and told me "good morning" CN: Face is relatively symmetric, she does fixate, does not clearly track Motor: Moves all extremities to noxious stimuli  Pertinent Labs: Ammonia, TSH-normal ESR-slightly elevated  Impression: 82 year old female with increasing confusion in the setting of dementia. I do suspect that this represents mild delirium, and think that at this point there is some contribution from day/night reversal.  She did have an elevated ESR, and I do wonder if there is some underlying mild infection driving process. She demonstrated aspiration risk I wonder if possibly an aspiration pneumonitis could have precipitated her state. There is only mild peribronchial thickening seemed chest x-ray.  Recommendations: 1) consider low dose seroquel in the early evening 2) EEG  3) neurology will continue to follow   Roland Rack, MD [facility] [phone]  If 7pm- 7am, please page neurology on call as listed in [facility].

## 2015-08-24 NOTE — NC FL2 (Signed)
[place] LEVEL OF CARE SCREENING TOOL     IDENTIFICATION  Patient Name: Kristina [name] Birthdate: 09-24-1930 Sex: female Admission Date (Current Location): 08/21/2015  [place] and [place] Number:  Kathleen Argue [id number] [place] and Address:  [facility],  [place] [address], [place]      Provider Number: [id number]  Attending Physician Name and Address:  Elmarie Shiley, MD  Relative Name and Phone Number:       Current Level of Care: Hospital Recommended Level of Care: [place] Prior Approval Number:    Date Approved/Denied:   PASRR Number:    Discharge Plan: SNF    Current Diagnoses: Patient Active Problem List   Diagnosis Date Noted  . Malnutrition of moderate degree 08/24/2015  . Altered mental status 08/22/2015  . Acute encephalopathy 08/22/2015  . Normochromic normocytic anemia   . 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 Dementia of Alzheimer's type with behavioral disturbance 08/05/2015  . Alzheimer's dementia 11/14/2014  . Depression 11/14/2014  . Pure hypercholesterolemia 03/01/2013  . DVT, lower extremity ([place]) 10/19/2012  . Dyspnea 10/15/2012  . Insulin dependent diabetes mellitus ([place]) 10/15/2012  . HTN (hypertension) 10/15/2012  . PE (pulmonary embolism) 10/15/2012  . CKD (chronic kidney disease) stage 3, GFR 30-59 ml/min 10/15/2012    Orientation RESPIRATION BLADDER Height & Weight    Self  Normal Incontinent 5\' 5"  (165.1 cm) 115 lbs.  BEHAVIORAL SYMPTOMS/MOOD NEUROLOGICAL BOWEL NUTRITION STATUS  Other (Comment), Wanderer (dementia with behavioral distubances, admitted from memory care assisted living, pt has not displayed wandering in hospital, but per pt family, pt has history of wandering)   Incontinent Diet (DYS 1 Room service appropriate; Fluid consistency: Nectar Thick)  AMBULATORY STATUS COMMUNICATION OF NEEDS Skin   Extensive Assist   (incomprehensible sounds) Normal                       Personal Care Assistance Level of Assistance  Bathing, Feeding, Dressing Bathing Assistance: Maximum assistance Feeding assistance: Limited assistance Dressing Assistance: Maximum assistance Total Care Assistance: Maximum assistance   Functional Limitations Info  Speech, Sight, Hearing Sight Info: Adequate Hearing Info: Adequate Speech Info: Impaired    SPECIAL CARE FACTORS FREQUENCY  PT (By licensed PT), OT (By licensed OT)     PT Frequency: 3x week OT Frequency: 2x week            Contractures Contractures Info: Not present    Additional Factors Info  Insulin Sliding Scale Code Status Info: FULL Allergies Info: Cortisone, Oxycodone, PCNS   Insulin Sliding Scale Info: Insulin dependent DM       Current Medications (08/24/2015):  This is the current hospital active medication list Current Facility-Administered Medications  Medication Dose Route Frequency Provider Last Rate Last Dose  . acetaminophen (TYLENOL) tablet 650 mg  650 mg Oral Q6H PRN Ilene Qua Opyd, MD      . dextrose 5 %-0.45 % sodium chloride infusion   Intravenous Continuous Belkys A Regalado, MD 75 mL/hr at 08/24/15 0728    . feeding supplement (ENSURE ENLIVE) (ENSURE ENLIVE) liquid 237 mL  237 mL Oral TID WC Ilene Qua Opyd, MD   237 mL at 08/23/15 1715  . food thickener (THICK IT) powder 1 g  1 g Oral PRN Belkys A Regalado, MD      . heparin injection 5,000 Units  5,000 Units Subcutaneous 3 times per day Vianne Bulls, MD  5,000 Units at 08/24/15 1324  . hydrALAZINE (APRESOLINE) injection 10 mg  10 mg Intravenous Q8H PRN Belkys A Regalado, MD      . multivitamin with minerals tablet 1 tablet  1 tablet Oral Daily Vianne Bulls, MD   1 tablet at 08/24/15 1014  . pantoprazole (PROTONIX) injection 40 mg  40 mg Intravenous Q12H Belkys A Regalado, MD   40 mg at 08/24/15 1014  . QUEtiapine (SEROQUEL) tablet 25 mg  25 mg Oral BID Belkys A Regalado,  MD      . RESOURCE THICKENUP CLEAR   Oral PRN Belkys A Regalado, MD      . rosuvastatin (CRESTOR) tablet 20 mg  20 mg Oral Daily Vianne Bulls, MD   20 mg at 08/23/15 1715  . senna (SENOKOT) tablet 8.6 mg  1 tablet Oral Daily PRN Vianne Bulls, MD         Discharge Medications: Please see discharge summary for a list of discharge medications.  Relevant Imaging Results:  Relevant Lab Results:   Additional Information SSN: [id number]  Ellajane Stong A, LCSW

## 2015-08-25 DIAGNOSIS — F0390 Unspecified dementia without behavioral disturbance: Secondary | ICD-10-CM

## 2015-08-25 DIAGNOSIS — G308 Other Alzheimer's disease: Secondary | ICD-10-CM

## 2015-08-25 LAB — GLUCOSE, CAPILLARY
GLUCOSE-CAPILLARY: 193 mg/dL — AB (ref 65–99)
GLUCOSE-CAPILLARY: 198 mg/dL — AB (ref 65–99)
Glucose-Capillary: 166 mg/dL — ABNORMAL HIGH (ref 65–99)
Glucose-Capillary: 285 mg/dL — ABNORMAL HIGH (ref 65–99)

## 2015-08-25 LAB — BASIC METABOLIC PANEL
Anion gap: 11 (ref 5–15)
BUN: 14 mg/dL (ref 6–20)
CALCIUM: 9 mg/dL (ref 8.9–10.3)
CO2: 26 mmol/L (ref 22–32)
Chloride: 105 mmol/L (ref 101–111)
Creatinine, Ser: 1.23 mg/dL — ABNORMAL HIGH (ref 0.44–1.00)
GFR calc Af Amer: 46 mL/min — ABNORMAL LOW (ref >60)
GFR, EST NON AFRICAN AMERICAN: 40 mL/min — AB (ref >60)
GLUCOSE: 179 mg/dL — AB (ref 65–99)
Potassium: 3.6 mmol/L (ref 3.5–5.1)
Sodium: 142 mmol/L (ref 135–145)

## 2015-08-25 LAB — CBC WITH DIFFERENTIAL/PLATELET
BASOS ABS: 0 10*3/uL (ref 0.0–0.1)
BASOS PCT: 0 %
EOS PCT: 0 %
Eosinophils Absolute: 0 10*3/uL (ref 0.0–0.7)
HCT: 30.6 % — ABNORMAL LOW (ref 36.0–46.0)
Hemoglobin: 9.7 g/dL — ABNORMAL LOW (ref 12.0–15.0)
Lymphocytes Relative: 13 %
Lymphs Abs: 0.8 10*3/uL (ref 0.7–4.0)
MCH: 30.6 pg (ref 26.0–34.0)
MCHC: 31.7 g/dL (ref 30.0–36.0)
MCV: 96.5 fL (ref 78.0–100.0)
MONO ABS: 0.4 10*3/uL (ref 0.1–1.0)
Monocytes Relative: 7 %
Neutro Abs: 4.8 10*3/uL (ref 1.7–7.7)
Neutrophils Relative %: 80 %
PLATELETS: 207 10*3/uL (ref 150–400)
RBC: 3.17 MIL/uL — ABNORMAL LOW (ref 3.87–5.11)
RDW: 13.9 % (ref 11.5–15.5)
WBC: 6 10*3/uL (ref 4.0–10.5)

## 2015-08-25 LAB — RAPID URINE DRUG SCREEN, HOSP PERFORMED
Amphetamines: NOT DETECTED
BARBITURATES: NOT DETECTED
BENZODIAZEPINES: NOT DETECTED
COCAINE: NOT DETECTED
Opiates: NOT DETECTED
TETRAHYDROCANNABINOL: NOT DETECTED

## 2015-08-25 LAB — HEMOGLOBIN A1C
HEMOGLOBIN A1C: 7.5 % — AB (ref 4.8–5.6)
Mean Plasma Glucose: 169 mg/dL

## 2015-08-25 MED ORDER — SODIUM CHLORIDE 0.45 % IV SOLN
INTRAVENOUS | Status: DC
  Administered 2015-08-25 – 2015-08-30 (×10): via INTRAVENOUS

## 2015-08-25 NOTE — Consult Note (Signed)
[facility] Face-to-Face Psychiatry Consult follow-up  Reason for Consult:  AMS Referring Physician:  Dr. Tyrell Antonio Patient Identification: Kristina [name] MRN:  [id number] Principal Diagnosis: Dementia of Alzheimer's type with behavioral disturbance Diagnosis:   Patient Active Problem List   Diagnosis Date Noted  . Malnutrition of moderate degree [E44.0] 08/24/2015  . Altered mental status [R41.82] 08/22/2015  . Acute encephalopathy [G93.40] 08/22/2015  . Normochromic normocytic anemia [D64.9]   . Dementia in Alzheimer's disease with early onset with behavioral disturbance [G30.0, F02.81] 08/05/2015  . Dementia of Alzheimer's type with behavioral disturbance [G30.8] 08/05/2015  . Alzheimer's dementia [G30.9, F02.80] 11/14/2014  . Depression [F32.9] 11/14/2014  . Pure hypercholesterolemia [E78.00] 03/01/2013  . DVT, lower extremity ([place]) [I82.409] 10/19/2012  . Dyspnea [R06.00] 10/15/2012  . Insulin dependent diabetes mellitus ([place]) [E11.9, Z79.4] 10/15/2012  . HTN (hypertension) [I10] 10/15/2012  . PE (pulmonary embolism) [I26.99] 10/15/2012  . CKD (chronic kidney disease) stage 3, GFR 30-59 ml/min [N18.3] 10/15/2012    Total Time spent with patient: 20 minutes  Subjective:   Kristina [name] is a 82 y.o. female patient admitted with AMS.  HPI:  Kristina [name] is a 82 y.o. female with PMH of insulin-dependent diabetes mellitus, chronic kidney disease stage III, and Alzheimer's dementia with behavioral disturbance who presents from her SNF with a decreased level of consciousness. Patient seen, chart reviewed for face-to-face psychiatric consultation and evaluation of psychiatric medication management for patients with a dementia along with a behavioral problems. Patient is a poor historian and unable to contribute verbally to the history and evaluation. Information for this evaluation obtained from mostly reviewing electronic medical records and case discussed with staff RN. Per report, the  patient is typically verbal, though confused, and assists with her feeds. Prior to arrival, the patient was reportedly not opening her eyes or giving any response except withdrawing from pain. Per the SNF personnel, she had been in her usual state until the day of her presentation. There is reportedly been no recent change in her medications. Patient was previously evaluated at [facility] 08/07/2015 for increased symptoms of depression and behavioral problems but does not required inpatient hospitalization.   Medical history: patient was found to be afebrile, saturating well on room air, and with vital signs stable. CT head without contrast was obtained and negative for acute intracranial abnormality. Chest x-ray revealed mild vascular congestion and mild peribronchial thickening but the airways were otherwise clear. Initial blood work returned notable for a slight bump in her serum creatinine and an approximate 2 g drop in hemoglobin from 3 weeks ago. FOBT was negative and there is no sign of active bleed. UA was unremarkable.   Past Psychiatric History: Dementia with behavioral problems and depression  Interval history: Patient has been tolerating his current medication management and reportedly has no adverse affects. Case discussed with psychiatric social service under staff RN. Patient is a poor historian and does not interact during this evaluation.  Risk to Self: Is patient at risk for suicide?: No Risk to Others:   Prior Inpatient Therapy:   Prior Outpatient Therapy:    Past Medical History:  Past Medical History  Diagnosis Date  . Diabetes mellitus   . Hypertension   . Renal disorder   . Cancer ([place]) 2000    sp bilateral masectomy, sp chemo  . DVT (deep venous thrombosis) ([place])   . PE (pulmonary embolism)   . Dementia   . GERD (gastroesophageal reflux disease)   . Dehydration   .  Hypokalemia   . GERD (gastroesophageal reflux disease)   . Fracture of ramus of mandible ([place])   .  Hyperlipidemia   . Alzheimer disease   . Chronic kidney disease (CKD), stage III (moderate)     Past Surgical History  Procedure Laterality Date  . Breast surgery    . Mastecomy     Family History:  Family History  Problem Relation Age of Onset  . Hypertension Mother   . Diabetes Mellitus II Father    Family Psychiatric  History: Unknown Social History:  History  Alcohol Use No     History  Drug Use No    Social History   Social History  . Marital Status: Married    Spouse Name: N/A  . Number of Children: N/A  . Years of Education: N/A   Social History Main Topics  . Smoking status: Never Smoker   . Smokeless tobacco: None  . Alcohol Use: No  . Drug Use: No  . Sexual Activity: Not Asked   Other Topics Concern  . None   Social History Narrative   Additional Social History:                          Allergies:   Allergies  Allergen Reactions  . Cortisone Other (See Comments)    Injection-altered mental status, didn't know her name, where she lived, etc.     . Oxycodone Other (See Comments)    Caused patient to go crazy  . Penicillins Rash    Per MAR     Labs:  Results for orders placed or performed during the hospital encounter of 08/21/15 (from the past 48 hour(s))  Glucose, capillary     Status: Abnormal   Collection Time: 08/23/15 11:41 AM  Result Value Ref Range   Glucose-Capillary 191 (H) 65 - 99 mg/dL  Glucose, capillary     Status: Abnormal   Collection Time: 08/23/15  4:46 PM  Result Value Ref Range   Glucose-Capillary 164 (H) 65 - 99 mg/dL  Glucose, capillary     Status: Abnormal   Collection Time: 08/23/15 10:44 PM  Result Value Ref Range   Glucose-Capillary 181 (H) 65 - 99 mg/dL  CBC     Status: Abnormal   Collection Time: 08/24/15  4:52 AM  Result Value Ref Range   WBC 2.5 (L) 4.0 - 10.5 K/uL   RBC 2.82 (L) 3.87 - 5.11 MIL/uL   Hemoglobin 8.6 (L) 12.0 - 15.0 g/dL   HCT 27.1 (L) 36.0 - 46.0 %   MCV 96.1 78.0 - 100.0 fL    MCH 30.5 26.0 - 34.0 pg   MCHC 31.7 30.0 - 36.0 g/dL   RDW 13.9 11.5 - 15.5 %   Platelets 178 150 - 400 K/uL  Basic metabolic panel     Status: Abnormal   Collection Time: 08/24/15  4:52 AM  Result Value Ref Range   Sodium 140 135 - 145 mmol/L   Potassium 3.9 3.5 - 5.1 mmol/L   Chloride 104 101 - 111 mmol/L   CO2 24 22 - 32 mmol/L   Glucose, Bld 172 (H) 65 - 99 mg/dL   BUN 18 6 - 20 mg/dL   Creatinine, Ser 1.11 (H) 0.44 - 1.00 mg/dL   Calcium 8.5 (L) 8.9 - 10.3 mg/dL   GFR calc non Af Amer 45 (L) >60 mL/min   GFR calc Af Amer 52 (L) >60 mL/min    Comment: (NOTE)  The eGFR has been calculated using the CKD EPI equation. This calculation has not been validated in all clinical situations. eGFR's persistently <60 mL/min signify possible Chronic Kidney Disease.    Anion gap 12 5 - 15  Glucose, capillary     Status: Abnormal   Collection Time: 08/24/15  7:32 AM  Result Value Ref Range   Glucose-Capillary 191 (H) 65 - 99 mg/dL  Glucose, capillary     Status: Abnormal   Collection Time: 08/24/15 11:48 AM  Result Value Ref Range   Glucose-Capillary 175 (H) 65 - 99 mg/dL  Glucose, capillary     Status: Abnormal   Collection Time: 08/24/15  4:52 PM  Result Value Ref Range   Glucose-Capillary 206 (H) 65 - 99 mg/dL  Glucose, capillary     Status: Abnormal   Collection Time: 08/24/15  8:40 PM  Result Value Ref Range   Glucose-Capillary 138 (H) 65 - 99 mg/dL   Comment 1 Notify RN    Comment 2 Document in Chart   Basic metabolic panel     Status: Abnormal   Collection Time: 08/25/15  3:50 AM  Result Value Ref Range   Sodium 142 135 - 145 mmol/L   Potassium 3.6 3.5 - 5.1 mmol/L   Chloride 105 101 - 111 mmol/L   CO2 26 22 - 32 mmol/L   Glucose, Bld 179 (H) 65 - 99 mg/dL   BUN 14 6 - 20 mg/dL   Creatinine, Ser 1.23 (H) 0.44 - 1.00 mg/dL   Calcium 9.0 8.9 - 10.3 mg/dL   GFR calc non Af Amer 40 (L) >60 mL/min   GFR calc Af Amer 46 (L) >60 mL/min    Comment: (NOTE) The eGFR has  been calculated using the CKD EPI equation. This calculation has not been validated in all clinical situations. eGFR's persistently <60 mL/min signify possible Chronic Kidney Disease.    Anion gap 11 5 - 15  CBC with Differential/Platelet     Status: Abnormal   Collection Time: 08/25/15  3:50 AM  Result Value Ref Range   WBC 6.0 4.0 - 10.5 K/uL   RBC 3.17 (L) 3.87 - 5.11 MIL/uL   Hemoglobin 9.7 (L) 12.0 - 15.0 g/dL   HCT 30.6 (L) 36.0 - 46.0 %   MCV 96.5 78.0 - 100.0 fL   MCH 30.6 26.0 - 34.0 pg   MCHC 31.7 30.0 - 36.0 g/dL   RDW 13.9 11.5 - 15.5 %   Platelets 207 150 - 400 K/uL   Neutrophils Relative % 80 %   Neutro Abs 4.8 1.7 - 7.7 K/uL   Lymphocytes Relative 13 %   Lymphs Abs 0.8 0.7 - 4.0 K/uL   Monocytes Relative 7 %   Monocytes Absolute 0.4 0.1 - 1.0 K/uL   Eosinophils Relative 0 %   Eosinophils Absolute 0.0 0.0 - 0.7 K/uL   Basophils Relative 0 %   Basophils Absolute 0.0 0.0 - 0.1 K/uL  Glucose, capillary     Status: Abnormal   Collection Time: 08/25/15  7:45 AM  Result Value Ref Range   Glucose-Capillary 166 (H) 65 - 99 mg/dL    Current Facility-Administered Medications  Medication Dose Route Frequency Provider Last Rate Last Dose  . acetaminophen (TYLENOL) tablet 650 mg  650 mg Oral Q6H PRN Vianne Bulls, MD   650 mg at 08/24/15 2240  . dextrose 5 %-0.45 % sodium chloride infusion   Intravenous Continuous Belkys A Regalado, MD 75 mL/hr at 08/24/15 2206    .  feeding supplement (ENSURE ENLIVE) (ENSURE ENLIVE) liquid 237 mL  237 mL Oral TID WC Ilene Qua Opyd, MD   237 mL at 08/25/15 1018  . food thickener (THICK IT) powder 1 g  1 g Oral PRN Belkys A Regalado, MD      . heparin injection 5,000 Units  5,000 Units Subcutaneous 3 times per day Vianne Bulls, MD   5,000 Units at 08/25/15 0174  . hydrALAZINE (APRESOLINE) injection 10 mg  10 mg Intravenous Q8H PRN Belkys A Regalado, MD      . multivitamin with minerals tablet 1 tablet  1 tablet Oral Daily Vianne Bulls,  MD   1 tablet at 08/25/15 1018  . pantoprazole (PROTONIX) injection 40 mg  40 mg Intravenous Q12H Belkys A Regalado, MD   40 mg at 08/25/15 1018  . QUEtiapine (SEROQUEL) tablet 25 mg  25 mg Oral BID Belkys A Regalado, MD   25 mg at 08/24/15 2052  . Ray   Oral PRN Belkys A Regalado, MD      . rosuvastatin (CRESTOR) tablet 20 mg  20 mg Oral Daily Vianne Bulls, MD   20 mg at 08/24/15 1700  . senna (SENOKOT) tablet 8.6 mg  1 tablet Oral Daily PRN Vianne Bulls, MD        Musculoskeletal: Strength & Muscle Tone: decreased Gait & Station: unable to stand Patient leans: N/A  Psychiatric Specialty Exam: ROS unable to complete due to current mental status   Blood pressure 140/62, pulse 98, temperature 98.9 F (37.2 C), temperature source Axillary, resp. rate 18, height 5' 5" (1.651 m), weight 52.164 kg (115 lb), SpO2 98 %.Body mass index is 19.14 kg/(m^2).  General Appearance: Guarded  Eye Contact::  Poor  Speech:  NA  Volume:  Not applicable  Mood:  NA  Affect:  NA  Thought Process:  NA  Orientation:  NA  Thought Content:  NA  Suicidal Thoughts:  No  Homicidal Thoughts:  No  Memory:  NA  Judgement:  NA  Insight:  NA  Psychomotor Activity:  Psychomotor Retardation  Concentration:  Poor  Recall:  Poor  Fund of Knowledge:NA  Language: NA  Akathisia:  NA  Handed:  Right  AIMS (if indicated):     Assets:  Others:  Secondary to patient has been mute  ADL's:  Impaired  Cognition: Impaired,  Severe  Sleep:      Treatment Plan Summary: Daily contact with patient to assess and evaluate symptoms and progress in treatment and Medication management Patient has no self-injurious behaviors status suicidal or homicidal ideations  Patient may need safety sitter for fall prevention  We will provide Seroquel 25 mg at 4 PM and 8 PM to prevent sundown syndrome/agitation Continue initiation for possible encephalopathy Appreciate psychiatric consultation and follow up as  clinically required Please contact [phone] or [phone] if needs further assistance   Disposition: No evidence of imminent risk to self or others at present.   Supportive therapy provided about ongoing stressors.  Huckleberry Martinson,JANARDHAHA R. 08/25/2015 10:59 AM

## 2015-08-25 NOTE — Progress Notes (Signed)
Speech Language Pathology Treatment: Dysphagia  Patient Details Name: Kristina [name] MRN: [id number] DOB: 1931/07/11 Today's Date: 08/25/2015 Time: [id number] SLP Time Calculation (min) (ACUTE ONLY): 20 min  Assessment / Plan / Recommendation Clinical Impression  Pt today sitting upright in bed and articulating well.  Pt is in restraints and trying to get OOB but was willing to be redirected with SLP max verbal/visual cues to attend to po intake.  Trials of ice cream, Ensure, graham cracker *moistened* and thin water via cup/straw provided.    NO s/s of aspiration however pt presents with symptoms of possible pharyngeal *esophageal* deficits characterized by multiple swallows across all consistencies.  Pt able to masticate cracker - albeit slowly- with adequate clearance.   Delayed belching noted but no s/s of aspiration.   Due to ongoing swallow delay and increased aspiration risk, recommend to advance diet slowly - Dys2/nectar currently with strict precautions.  NT reports pt ate well today = belching after completion of snack but not having coughing with po intake.      HPI HPI: 82 y.o. female with PMH of insulin-dependent diabetes mellitus, chronic kidney disease stage III, GERD, HTN, mandible fx, and Alzheimer's dementia with behavioral disturbance admitted from SNF with a decreased level of consciousness.  CT head without contrast negative for acute intracranial abnormality. Chest x-ray revealed mild vascular congestion and mild peribronchial thickening but the airways were otherwise clear. Daughter reports SNF will not change diet from regular texture unless seen by SLP or MD orders (pt has been at facility 4 weeks).       SLP Plan        Recommendations  Diet recommendations: Dysphagia 2 (fine chop);Nectar-thick liquid Liquids provided via: Cup;Straw Medication Administration: Whole meds with puree Supervision: Patient able to self feed Compensations: Slow rate;Small  sips/bites Postural Changes and/or Swallow Maneuvers: Seated upright 90 degrees;Upright 30-60 min after meal             Oral Care Recommendations: Oral care BID Follow up Recommendations: Skilled Nursing facility     [place], [place] [facility] SLP [phone]

## 2015-08-25 NOTE — Progress Notes (Signed)
Subjective: Had some agitation overnight. She did receive Haldol.  Exam: Filed Vitals:   08/24/15 2042 08/25/15 0637  BP: 158/66 140/62  Pulse: 101 98  Temp: 98.5 F (36.9 C) 98.9 F (37.2 C)  Resp: 20 18   Gen: In bed, NAD Resp: non-labored breathing, no acute distress Abd: soft, nt  Neuro: MS: Awakens much more easily than yesterday. She is able to tell me her name, and follows simple commands CN: Possible mild left facial weakness, but this is possibly also due to positioning. Motor: She does not have any clear weakness Sensory: Responds to stimuli bilaterally  Pertinent Labs: Creatinine 1.2  Impression: 82 year old female living in a memory unit at baseline, who presented with worsening delirium. I suspect that much of this is situational delirium.  Recommendations: 1) agree with scheduled Seroquel and 4 an 8 p.m. 2) can continue to use prn antipsychotics for severe agitation, but would try to minimize their use. 3) keep wound is open during the day, minimize stimulation and night. 4) no further neurodiagnostic testing at this time. Please call with any further questions or concerns.   Roland Rack, MD [facility] [phone]  If 7pm- 7am, please page neurology on call as listed in [place].

## 2015-08-25 NOTE — Care Management Important Message (Signed)
Important Message  Patient Details  Name: Kristina [name] MRN: [id number] Date of Birth: [birth year]   Medicare Important Message Given:  Yes    Camillo Flaming 08/25/2015, 11:07 AMImportant Message  Patient Details  Name: Kristina [name] MRN: [id number] Date of Birth: 1931/04/18   Medicare Important Message Given:  Yes    Camillo Flaming 08/25/2015, 11:06 AM

## 2015-08-25 NOTE — Progress Notes (Signed)
[facility] PROGRESS NOTE  TULSA BRYE [id number] DOB: 1931-07-13 DOA: 08/21/2015 PCP: Rulon Eisenmenger, NP  Assessment/Plan: Kristina [name] is a 82 y.o. female with PMH of insulin-dependent diabetes mellitus, chronic kidney disease stage III, and Alzheimer's dementia with behavioral disturbance who presents from her SNF with a decreased level of consciousness. SNF personnel suspected hypoglycemia tonight when patient could not be roused and activated EMS. CBG was 81 on the scene and patient was reportedly very resistant to EMS attempts at care. Per report, the patient is typically verbal, though confused, and assists with her feeds. Prior to arrival, the patient was reportedly not opening her eyes or giving any response except withdrawing from pain.  In ED, patient was found to be afebrile, saturating well on room air, and with vital signs stable. CT head without contrast was obtained and negative for acute intracranial abnormality. Chest x-ray revealed mild vascular congestion and mild peribronchial thickening but the airways were otherwise clear. Initial blood work returned notable for a slight bump in her serum creatinine and an approximate 2 g drop in hemoglobin from 3 weeks ago. FOBT was negative and there is no sign of active bleed.   1. Acute encephalopathy, might be worsening of dementia, vs delirium Has dementia, Alzheimer's with behavior disturbance.  - CT head with no acute intracranial abnormality  - Electrolytes stable  - UA negative  -TSH 1.377, ammonia 11, B-12 1117, folate 547, RPR non- reactive.  - Holding Neurontin  -MRI negative, ABG negative for hypercapnia. .  -Continue with  IV fluids.  -Neurology consulted and following. Neurology think component of delirium  -EEG, negative for seizure, generalized non specific cerebral dysfunction, encephalopathy/  Appreciate Psych and neuro input. Will stop navane, started on Seroquel.  Repeat chest x rau after  hydration negative for PNA For agitation would use haldol.  Alert today, restless at time  2. Insulin-dependent DM  - Last A1c 8.2% in 2014  - Hold Insulin not eating.  - Update A1c, P -cbg increasing will change D 5 to half NS.   3. Hypertension  - Hold oral medications,  -IV hydralazine.   4. CKD stage III  - SCr 1.49 on arrival, up from apparent baseline of 1.3  - Daily chem panel   5. Normocytic anemia  - Hgb 8.7 on admission, down from apparent baseline of 10.5  - FOBT is neg  - No sign of active bleed  - Continuing pharmacologic VTE ppx with caution  - Trend; consider transfusing for Hgb <7  Leukopenia; might be related to medications, like navane.   6-Abdominal Pain; protonix. KUB negative, abdominal exam benign. She denies abdominal pain today   Code Status: Full Code.  Family Communication: Husband at bedside. And daughter  Disposition Plan: remain inpatient for treatment and work up of encephalopathy.    Consultants:  Neurology  Procedures:  none  Antibiotics:  none  HPI/Subjective: Patient awake. Mental status fluctuates, at time she has been ;lethargic   Objective: Filed Vitals:   08/25/15 0637 08/25/15 1301  BP: 140/62 139/52  Pulse: 98 112  Temp: 98.9 F (37.2 C) 98 F (36.7 C)  Resp: 18 18    Intake/Output Summary (Last 24 hours) at 08/25/15 1741 Last data filed at 08/25/15 1500  Gross per 24 hour  Intake 1921.25 ml  Output      0 ml  Net 1921.25 ml   Filed Weights   08/22/15 0600  Weight: 52.164 kg (115 lb)    Exam:  General: more alert.   Cardiovascular: S 1, S 2 RRR  Respiratory: Bilateral air movement, coarse sounds.  Abdomen: BS presents, soft nt  Musculoskeletal: no edema  Data Reviewed: Basic Metabolic Panel:  Recent Labs Lab 08/22/15 0103 08/23/15 0740 08/24/15 0452 08/25/15 0350  NA 141 140 140 142  K 4.8 4.0 3.9 3.6  CL 104 104 104 105  CO2 28 27 24 26   GLUCOSE 136* 166* 172* 179*   BUN 30* 17 18 14   CREATININE 1.49* 1.20* 1.11* 1.23*  CALCIUM 9.0 8.8* 8.5* 9.0   Liver Function Tests:  Recent Labs Lab 08/22/15 0103  AST 19  ALT 17  ALKPHOS 97  BILITOT 0.6  PROT 6.6  ALBUMIN 3.1*   No results for input(s): LIPASE, AMYLASE in the last 168 hours.  Recent Labs Lab 08/22/15 0103  AMMONIA 11   CBC:  Recent Labs Lab 08/22/15 0103 08/22/15 0657 08/22/15 1429 08/23/15 0740 08/24/15 0452 08/25/15 0350  WBC 7.7  --  4.5 3.1* 2.5* 6.0  NEUTROABS 6.8  --   --   --   --  4.8  HGB 8.7*  --  9.1* 9.1* 8.6* 9.7*  HCT 27.9* 30.1* 28.8* 28.5* 27.1* 30.6*  MCV 98.6  --  96.0 95.0 96.1 96.5  PLT 198  --  193 191 178 207   Cardiac Enzymes:  Recent Labs Lab 08/22/15 0418  TROPONINI <0.03   BNP (last 3 results)  Recent Labs  12/24/14 2035  BNP 41.2    ProBNP (last 3 results) No results for input(s): PROBNP in the last 8760 hours.  CBG:  Recent Labs Lab 08/24/15 1652 08/24/15 2040 08/25/15 0745 08/25/15 1141 08/25/15 1656  GLUCAP 206* 138* 166* 285* 193*    No results found for this or any previous visit (from the past 240 hour(s)).   Studies: Dg Chest Port 1 View  08/24/2015  CLINICAL DATA:  Dementia and Cough.  Diabetes and hypertension. EXAM: PORTABLE CHEST 1 VIEW COMPARISON:  08/22/2015 FINDINGS: Midline trachea. Normal heart size and mediastinal contours. No pleural effusion or pneumothorax. Clear lungs. Skin fold over the chest bilaterally. IMPRESSION: No acute cardiopulmonary disease. Electronically Signed   By: Abigail Miyamoto M.D.   On: 08/24/2015 16:30    Scheduled Meds: . feeding supplement (ENSURE ENLIVE)  237 mL Oral TID WC  . heparin  5,000 Units Subcutaneous 3 times per day  . multivitamin with minerals  1 tablet Oral Daily  . pantoprazole (PROTONIX) IV  40 mg Intravenous Q12H  . QUEtiapine  25 mg Oral BID  . rosuvastatin  20 mg Oral Daily   Continuous Infusions: . dextrose 5 % and 0.45% NaCl 75 mL/hr at 08/24/15 2206     Principal Problem:   Dementia of Alzheimer's type with behavioral disturbance Active Problems:   Insulin dependent diabetes mellitus (HCC)   HTN (hypertension)   CKD (chronic kidney disease) stage 3, GFR 30-59 ml/min   Altered mental status   Acute encephalopathy   Normochromic normocytic anemia   Malnutrition of moderate degree    Time spent: 25 minutes.     Niel Hummer A  [facility] Pager [phone]. If 7PM-7AM, please contact night-coverage at [email or web address], password [facility] 08/25/2015, 5:41 PM  LOS: 3 days

## 2015-08-25 NOTE — Clinical Social Work Placement (Signed)
CSW provided SNF bed offers ([facility], [facility]) to patient's daughter, Ladona Mow (ph#: [phone]). Daughter to tour facilities & get back to CSW with SNF decision.   Patient currently has a posey belt/restraint - will need to be restraint-free for 24 hours before SNF could accept patient.     Raynaldo Opitz, [facility] Clinical Social Worker cell #: [phone]    CLINICAL SOCIAL WORK PLACEMENT  NOTE  Date:  08/25/2015  Patient Details  Name: Kristina [name] MRN: [id number] Date of Birth: 31-Aug-1930  Clinical Social Work is seeking post-discharge placement for this patient at the [place] level of care (*CSW will initial, date and re-position this form in  chart as items are completed):  Yes   Patient/family provided with [place] Work Department's list of facilities offering this level of care within the geographic area requested by the patient (or if unable, by the patient's family).  Yes   Patient/family informed of their freedom to choose among providers that offer the needed level of care, that participate in Medicare, Medicaid or managed care program needed by the patient, have an available bed and are willing to accept the patient.  Yes   Patient/family informed of [facility]'s ownership interest in [facility] and [facility], as well as of the fact that they are under no obligation to receive care at these facilities.  PASRR submitted to EDS on 08/25/15     PASRR number received on 08/25/15     Existing PASRR number confirmed on       FL2 transmitted to all facilities in geographic area requested by pt/family on 08/25/15     FL2 transmitted to all facilities within larger geographic area on 08/25/15     Patient informed that his/her managed care company has contracts with or will negotiate with certain facilities, including the following:        Yes    Patient/family informed of bed offers received.  Patient chooses bed at       Physician recommends and patient chooses bed at      Patient to be transferred to   on  .  Patient to be transferred to facility by       Patient family notified on   of transfer.  Name of family member notified:        PHYSICIAN       Additional Comment:    _______________________________________________ Standley Brooking, LCSW 08/25/2015, 3:29 PM

## 2015-08-26 LAB — BASIC METABOLIC PANEL
ANION GAP: 11 (ref 5–15)
BUN: 14 mg/dL (ref 6–20)
CHLORIDE: 106 mmol/L (ref 101–111)
CO2: 25 mmol/L (ref 22–32)
Calcium: 8.9 mg/dL (ref 8.9–10.3)
Creatinine, Ser: 1.09 mg/dL — ABNORMAL HIGH (ref 0.44–1.00)
GFR calc Af Amer: 53 mL/min — ABNORMAL LOW (ref >60)
GFR, EST NON AFRICAN AMERICAN: 46 mL/min — AB (ref >60)
GLUCOSE: 155 mg/dL — AB (ref 65–99)
POTASSIUM: 3.7 mmol/L (ref 3.5–5.1)
Sodium: 142 mmol/L (ref 135–145)

## 2015-08-26 LAB — CBC
HEMATOCRIT: 29.8 % — AB (ref 36.0–46.0)
Hemoglobin: 9.5 g/dL — ABNORMAL LOW (ref 12.0–15.0)
MCH: 30.4 pg (ref 26.0–34.0)
MCHC: 31.9 g/dL (ref 30.0–36.0)
MCV: 95.5 fL (ref 78.0–100.0)
PLATELETS: 194 10*3/uL (ref 150–400)
RBC: 3.12 MIL/uL — AB (ref 3.87–5.11)
RDW: 13.9 % (ref 11.5–15.5)
WBC: 4.1 10*3/uL (ref 4.0–10.5)

## 2015-08-26 LAB — GLUCOSE, CAPILLARY
GLUCOSE-CAPILLARY: 139 mg/dL — AB (ref 65–99)
GLUCOSE-CAPILLARY: 130 mg/dL — AB (ref 65–99)
Glucose-Capillary: 180 mg/dL — ABNORMAL HIGH (ref 65–99)
Glucose-Capillary: 215 mg/dL — ABNORMAL HIGH (ref 65–99)

## 2015-08-26 MED ORDER — LORAZEPAM 2 MG/ML IJ SOLN
0.5000 mg | Freq: Two times a day (BID) | INTRAMUSCULAR | Status: DC | PRN
  Administered 2015-08-26: 0.5 mg via INTRAVENOUS
  Filled 2015-08-26 (×2): qty 1

## 2015-08-26 MED ORDER — LORAZEPAM 2 MG/ML IJ SOLN
0.5000 mg | Freq: Four times a day (QID) | INTRAMUSCULAR | Status: DC | PRN
  Administered 2015-08-26 – 2015-08-28 (×4): 0.5 mg via INTRAVENOUS
  Filled 2015-08-26 (×3): qty 1

## 2015-08-26 MED ORDER — QUETIAPINE FUMARATE 25 MG PO TABS
25.0000 mg | ORAL_TABLET | Freq: Every day | ORAL | Status: DC
  Administered 2015-08-26 – 2015-08-31 (×4): 25 mg via ORAL
  Filled 2015-08-26 (×3): qty 1

## 2015-08-26 NOTE — Care Management Note (Signed)
Case Management Note  Patient Details  Name: Kristina [name] MRN: [id number] Date of Birth: Mar 09, 1931  Subjective/Objective:   Pt admitted with Dementia                 Action/Plan:  Plan to dc to SNF   Expected Discharge Date:                  Expected Discharge Plan:  Skilled Nursing Facility  In-House Referral:  Clinical Social Work  Discharge planning Services  CM Consult  Post Acute Care Choice:    Choice offered to:     DME Arranged:    DME Agency:     HH Arranged:    [place] Agency:     Status of Service:  Completed, signed off  Medicare Important Message Given:  Yes Date Medicare IM Given:    Medicare IM give by:    Date Additional Medicare IM Given:    Additional Medicare Important Message give by:     If discussed at [place] of Stay Meetings, dates discussed:    Additional CommentsPurcell Mouton, RN 08/26/2015, 12:20 PM

## 2015-08-26 NOTE — Progress Notes (Addendum)
[facility] PROGRESS NOTE  Kristina [name] [id number] DOB: 06/14/1931 DOA: 08/21/2015  PCP: Rulon Eisenmenger, NP  Brief HPI: Kristina [name] is a 82 y.o. female with PMH of insulin-dependent diabetes mellitus, chronic kidney disease stage III, and Alzheimer's dementia with behavioral disturbance who presents from her SNF with a decreased level of consciousness. SNF personnel suspected hypoglycemia tonight when patient could not be roused and activated EMS. CBG was 81 on the scene and patient was reportedly very resistant to EMS attempts at care. Per report, the patient is typically verbal, though confused, and assists with her feeds. Prior to arrival, the patient was reportedly not opening her eyes or giving any response except withdrawing from pain. Evaluation in the hospital has not been conclusive for any reversible etiology for her agitation.  Past medical history:  Past Medical History  Diagnosis Date  . Diabetes mellitus   . Hypertension   . Renal disorder   . Cancer ([place]) 2000    sp bilateral masectomy, sp chemo  . DVT (deep venous thrombosis) ([place])   . PE (pulmonary embolism)   . Dementia   . GERD (gastroesophageal reflux disease)   . Dehydration   . Hypokalemia   . GERD (gastroesophageal reflux disease)   . Fracture of ramus of mandible ([place])   . Hyperlipidemia   . Alzheimer disease   . Chronic kidney disease (CKD), stage III (moderate)     Consultants: Neurology, psychiatry  Procedures:  EEG "Clinical Interpretation: This EEG is consistent with a generalized non-specific cerebral dysfunction(encephalopathy). There was no seizure or seizure predisposition recorded on this study."  Antibiotics: None  Subjective: Patient very agitated. She is attempting to get out of the bed. Does not communicate with me. Not fully cooperative with examination.  Objective: Vital Signs  Filed Vitals:   08/25/15 0637 08/25/15 1301 08/25/15 2235 08/26/15 0442  BP:  140/62 139/52 112/79 174/83  Pulse: 98 112 102 71  Temp: 98.9 F (37.2 C) 98 F (36.7 C) 97.8 F (36.6 C) 98.1 F (36.7 C)  TempSrc: Axillary Axillary Oral Oral  Resp: 18 18 18 18   Height:      Weight:      SpO2: 98% 98% 99% 92%    Intake/Output Summary (Last 24 hours) at 08/26/15 1104 Last data filed at 08/26/15 0600  Gross per 24 hour  Intake   1860 ml  Output      0 ml  Net   1860 ml   Filed Weights   08/22/15 0600  Weight: 52.164 kg (115 lb)    General appearance: alert, combative and no distress Resp: clear to auscultation bilaterally Cardio: regular rate and rhythm, S1, S2 normal, no murmur, click, rub or gallop GI: soft, non-tender; bowel sounds normal; no masses,  no organomegaly Extremities: extremities normal, atraumatic, no cyanosis or edema Neurologic: She is awake. Delirious. Not communicative. Moving all her extremities.  Lab Results:  Basic Metabolic Panel:  Recent Labs Lab 08/22/15 0103 08/23/15 0740 08/24/15 0452 08/25/15 0350 08/26/15 0337  NA 141 140 140 142 142  K 4.8 4.0 3.9 3.6 3.7  CL 104 104 104 105 106  CO2 28 27 24 26 25   GLUCOSE 136* 166* 172* 179* 155*  BUN 30* 17 18 14 14   CREATININE 1.49* 1.20* 1.11* 1.23* 1.09*  CALCIUM 9.0 8.8* 8.5* 9.0 8.9   Liver Function Tests:  Recent Labs Lab 08/22/15 0103  AST 19  ALT 17  ALKPHOS 97  BILITOT 0.6  PROT 6.6  ALBUMIN 3.1*    Recent Labs Lab 08/22/15 0103  AMMONIA 11   CBC:  Recent Labs Lab 08/22/15 0103  08/22/15 1429 08/23/15 0740 08/24/15 0452 08/25/15 0350 08/26/15 0337  WBC 7.7  --  4.5 3.1* 2.5* 6.0 4.1  NEUTROABS 6.8  --   --   --   --  4.8  --   HGB 8.7*  --  9.1* 9.1* 8.6* 9.7* 9.5*  HCT 27.9*  < > 28.8* 28.5* 27.1* 30.6* 29.8*  MCV 98.6  --  96.0 95.0 96.1 96.5 95.5  PLT 198  --  193 191 178 207 194  < > = values in this interval not displayed. Cardiac Enzymes:  Recent Labs Lab 08/22/15 0418  TROPONINI <0.03    CBG:  Recent Labs Lab  08/25/15 0745 08/25/15 1141 08/25/15 1656 08/25/15 2241 08/26/15 0728  GLUCAP 166* 285* 193* 198* 180*    Recent Results (from the past 240 hour(s))  Urine culture     Status: None (Preliminary result)   Collection Time: 08/25/15  6:31 PM  Result Value Ref Range Status   Specimen Description URINE, CLEAN CATCH  Final   Special Requests NONE  Final   Culture   Final    TOO YOUNG TO READ Performed at [facility]    Report Status PENDING  Incomplete      Studies/Results: Dg Chest Port 1 View  08/24/2015  CLINICAL DATA:  Dementia and Cough.  Diabetes and hypertension. EXAM: PORTABLE CHEST 1 VIEW COMPARISON:  08/22/2015 FINDINGS: Midline trachea. Normal heart size and mediastinal contours. No pleural effusion or pneumothorax. Clear lungs. Skin fold over the chest bilaterally. IMPRESSION: No acute cardiopulmonary disease. Electronically Signed   By: Abigail Miyamoto M.D.   On: 08/24/2015 16:30    Medications:  Scheduled: . feeding supplement (ENSURE ENLIVE)  237 mL Oral TID WC  . heparin  5,000 Units Subcutaneous 3 times per day  . multivitamin with minerals  1 tablet Oral Daily  . pantoprazole (PROTONIX) IV  40 mg Intravenous Q12H  . QUEtiapine  25 mg Oral BID  . QUEtiapine  25 mg Oral Daily  . rosuvastatin  20 mg Oral Daily   Continuous: . sodium chloride 75 mL/hr at 08/26/15 0609   [id number], food thickener, hydrALAZINE, LORazepam, RESOURCE THICKENUP CLEAR, senna  Assessment/Plan:  Principal Problem:   Dementia of Alzheimer's type with behavioral disturbance Active Problems:   Insulin dependent diabetes mellitus (HCC)   HTN (hypertension)   CKD (chronic kidney disease) stage 3, GFR 30-59 ml/min   Altered mental status   Acute encephalopathy   Normochromic normocytic anemia   Malnutrition of moderate degree    Acute encephalopathy in the setting of known dementia Evaluation in the hospital so far has included a CT head, UA, TSH, ammonia B-12, RPR,  MRI brain, EEG. These test have been unremarkable. She has been seen by neurology and psychiatry. She hasn't been found to have any reversible etiology for her symptoms. This is most likely progression of her dementia. Seroquel has been added. We will increase the frequency today. She is noted to be on Ativan at [facility]. She'll be given as needed. Patient noted to have poor oral intake.  Insulin-dependent DM  Monitor CBGs. HbA1c is 7.5. Oral intake remains poor.  History of essential Hypertension  Holding oral medications. IV hydralazine as needed.  Mild acute on CKD stage III  SCr 1.49 on arrival. Improved with IV fluids.  Normocytic anemia  Hemoglobin remained stable.  No evidence for overt bleeding.  Leukopenia Might be related to medications, like navane. Now resolved.  Abdominal Pain KUB negative, abdominal exam benign. LFTs are normal. No further episodes of pain.   DVT Prophylaxis: Subcutaneous heparin    Code Status: CODE STATUS discussed with the patient's husband Tytianna Griner today. She will now be DO NOT RESUSCITATE.  Family Communication: Discussed with her husband Allen Cohenour in detail  Disposition Plan: Ativan has been added. Seroquel frequency has been adjusted. If there is no appreciable improvement in the next 24 hours we will consult palliative medicine. Husband is agreeable with this plan.    LOS: 4 days   [facility] Pager [phone] 08/26/2015, 11:04 AM  If 7PM-7AM, please contact night-coverage at [email or web address], password [facility]

## 2015-08-26 NOTE — Clinical Social Work Placement (Signed)
CSW received call from patients daughter that they have decided on [facility] for patient at discharge.   Patient currently has a posey belt/restraint - will need to be restraint-free for 24 hours before SNF could accept patient.     Raynaldo Opitz, [facility] Clinical Social Worker cell #: [phone]  CLINICAL SOCIAL WORK PLACEMENT  NOTE  Date:  08/26/2015  Patient Details  Name: Kristina [name] MRN: [id number] Date of Birth: 15-Aug-1930  Clinical Social Work is seeking post-discharge placement for this patient at the [place] level of care (*CSW will initial, date and re-position this form in  chart as items are completed):  Yes   Patient/family provided with [place] Work Department's list of facilities offering this level of care within the geographic area requested by the patient (or if unable, by the patient's family).  Yes   Patient/family informed of their freedom to choose among providers that offer the needed level of care, that participate in Medicare, Medicaid or managed care program needed by the patient, have an available bed and are willing to accept the patient.  Yes   Patient/family informed of [facility]'s ownership interest in [facility] and [facility], as well as of the fact that they are under no obligation to receive care at these facilities.  PASRR submitted to EDS on 08/25/15     PASRR number received on 08/25/15     Existing PASRR number confirmed on       FL2 transmitted to all facilities in geographic area requested by pt/family on 08/25/15     FL2 transmitted to all facilities within larger geographic area on 08/25/15     Patient informed that his/her managed care company has contracts with or will negotiate with certain facilities, including the following:        Yes   Patient/family informed of bed offers received.  Patient chooses bed at [facility]     Physician  recommends and patient chooses bed at      Patient to be transferred to [facility] on  .  Patient to be transferred to facility by       Patient family notified on   of transfer.  Name of family member notified:        PHYSICIAN       Additional Comment:    _______________________________________________ Standley Brooking, LCSW 08/26/2015, 2:09 PM

## 2015-08-26 NOTE — Progress Notes (Addendum)
SLP received call from RN stating pt is not tolerating diet advancement to dys2/nectar.  Will back diet down to puree/nectar  based on report of pt choking on foods. Luanna Salk, [facility] SLP [phone]

## 2015-08-27 LAB — URINE CULTURE

## 2015-08-27 LAB — GLUCOSE, CAPILLARY
GLUCOSE-CAPILLARY: 130 mg/dL — AB (ref 65–99)
GLUCOSE-CAPILLARY: 82 mg/dL (ref 65–99)
Glucose-Capillary: 109 mg/dL — ABNORMAL HIGH (ref 65–99)

## 2015-08-27 NOTE — Progress Notes (Signed)
Speech Language Pathology Treatment: Dysphagia  Patient Details Name: Kristina [name] MRN: [id number] DOB: 12/07/1930 Today's Date: 08/27/2015 Time: [id number] SLP Time Calculation (min) (ACUTE ONLY): 27 min  Assessment / Plan / Recommendation Clinical Impression  Pt in bed, reaching out for computer with mitts in place.  SLP provided pt with gingerale via straw- multiple swallows noted with each bolus suspect due to piece mealing.  Delayed cough x1 noted, which SLP does not suspect is due to aspiration as NT present reports pt had congested cough even Mon/Tues not coorelated to po intake.  RN reports pt with minimal oral movement even with applesauce = dysphagia will contribute to risks re: hydration/nutrition. Recommend to continue to encourage intake -= providing sweeter foods due to gustatory changes associated with dementia.   Recommend to advance diet to puree/thin (suspect needed long term) and SLP to sign off - no family has been present to educate during SLP sessions.  Please reorder if indicated/desired.    HPI HPI: 82 y.o. female with PMH of insulin-dependent diabetes mellitus, chronic kidney disease stage III, GERD, HTN, mandible fx, and Alzheimer's dementia with behavioral disturbance admitted from SNF with a decreased level of consciousness.  CT head without contrast negative for acute intracranial abnormality. Chest x-ray revealed mild vascular congestion and mild peribronchial thickening but the airways were otherwise clear. Daughter reports SNF will not change diet from regular texture unless seen by SLP or MD orders (pt has been at facility 4 weeks).       SLP Plan        Recommendations  Diet recommendations: Dysphagia 1 (puree);Thin liquid Liquids provided via: Cup;Straw Medication Administration: Whole meds with puree (crush if large and not contraindicated) Supervision: Patient able to self feed Compensations: Slow rate;Small sips/bites (check for oral residuals) Postural  Changes and/or Swallow Maneuvers: Seated upright 90 degrees;Upright 30-60 min after meal             Oral Care Recommendations: Oral care BID Follow up Recommendations: Skilled Nursing facility     [place], [facility] SLP [phone]

## 2015-08-27 NOTE — Progress Notes (Signed)
Nutrition Follow-up  DOCUMENTATION CODES:   Non-severe (moderate) malnutrition in context of chronic illness  INTERVENTION:  - Continue Ensure Enlive BID mixed with Resource ThickenUp (ordered nectar-thick liquids) - Continue Magic Cup BID - RD will continue to monitor for needs  NUTRITION DIAGNOSIS:   Unintentional weight loss related to chronic illness as evidenced by percent weight loss. -ongoing  GOAL:   Patient will meet greater than or equal to 90% of their needs -likely unmet  MONITOR:   PO intake, Supplement acceptance, Weight trends, Labs, Skin, I & O's  ASSESSMENT:   82 y.o. female with PMH of insulin-dependent diabetes mellitus, chronic kidney disease stage III, and Alzheimer's dementia with behavioral disturbance who presents from her SNF with a decreased level of consciousness. SNF personnel suspected hypoglycemia tonight when patient could not be roused and activated EMS. CBG was 81 on the scene and patient was reportedly very resistant to EMS attempts at care. Per report, the patient is typically verbal, though confused, and assists with her feeds. Prior to arrival, the patient was reportedly not opening her eyes or giving any response except withdrawing from pain. Per the SNF personnel, she had been in her usual state until the day of her presentation. There is reportedly been no recent change in her medications.   1/26 Per chart review, pt ate 50% of breakfast and lunch and 10% of dinner 1/24. SLP saw pt yesterday afternoon and earlier this AM and recommendation made for Dysphagia 1, nectar-thick liquids; RN note from yesterday indicates that pt was unable to tolerate Dysphagia 2, which she was previously ordered. Spoke with tech who reports pt did not eat breakfast this AM.   Will continue order for Ensure rather than switching to Glucerna due to protein content with minimal intakes (20 grams/bottle of Ensure versus 10 grams/bottle of Glucerna).   Church member was  visiting pt at time of RD visit. Pt not meeting needs; will monitor for intakes and needs. Bilateral mitten restraints remain in place. No new weight since 08/22/15. Medications reviewed. Labs reviewed; CBGs: 109-285 mg/dL, creatinine elevated, GFR: 53.   1/23 - Per chart review, pt ate 0% of breakfast and lunch and 100% of dinner yesterday (1/22). - Breakfast tray in room and is untouched. - Pt sleeping at time of RD visit; noted hx of Alzheimer's dementia.  - No family/visitors present at time of visit to provide information from PTA.  - Per chart notes, pt had previously stated that at baseline pt is sometimes able to feed herself.  - Pt currently has bilateral mitten restraints in place. - Physical assessment shows mild and moderate muscle wasting, no fat wasting, and mild edema to BLE.  - Per chart review, pt has lost 20 lbs (15% body weight) in the past 8 months which is significant for time frame.  - Will monitor for need to switch from Ensure Enlive to Glucerna Shake dependent on CBGs   Diet Order:  DIET - DYS 1 Room service appropriate?: Yes; Fluid consistency:: Nectar Thick  Skin:  Reviewed, no issues  Last BM:  1/24  Height:   Ht Readings from Last 1 Encounters:  08/22/15 5\' 5"  (1.651 m)    Weight:   Wt Readings from Last 1 Encounters:  08/22/15 115 lb (52.164 kg)    Ideal Body Weight:  56.82 kg (kg)  BMI:  Body mass index is 19.14 kg/(m^2).  Estimated Nutritional Needs:   Kcal:  1200-1400  Protein:  50-60 grams  Fluid:  >/=  1.6 L/day  EDUCATION NEEDS:   No education needs identified at this time     Jarome Matin, [place], [facility] Clinical Dietitian Pager # [phone] After hours/weekend pager # [phone]

## 2015-08-27 NOTE — Progress Notes (Signed)
[facility] PROGRESS NOTE  MAIRELY MONDI [id number] DOB: 12/06/1930 DOA: 08/21/2015  PCP: Rulon Eisenmenger, NP  Brief HPI: Kristina [name] is a 82 y.o. female with PMH of insulin-dependent diabetes mellitus, chronic kidney disease stage III, and Alzheimer's dementia with behavioral disturbance who presents from her SNF with a decreased level of consciousness. SNF personnel suspected hypoglycemia tonight when patient could not be roused and activated EMS. CBG was 81 on the scene and patient was reportedly very resistant to EMS attempts at care. Per report, the patient is typically verbal, though confused, and assists with her feeds. Prior to arrival, the patient was reportedly not opening her eyes or giving any response except withdrawing from pain. Evaluation in the hospital has not been conclusive for any reversible etiology for her agitation.  Past medical history:  Past Medical History  Diagnosis Date  . Diabetes mellitus   . Hypertension   . Renal disorder   . Cancer ([place]) 2000    sp bilateral masectomy, sp chemo  . DVT (deep venous thrombosis) ([place])   . PE (pulmonary embolism)   . Dementia   . GERD (gastroesophageal reflux disease)   . Dehydration   . Hypokalemia   . GERD (gastroesophageal reflux disease)   . Fracture of ramus of mandible ([place])   . Hyperlipidemia   . Alzheimer disease   . Chronic kidney disease (CKD), stage III (moderate)     Consultants: Neurology, psychiatry  Procedures:  EEG "Clinical Interpretation: This EEG is consistent with a generalized non-specific cerebral dysfunction(encephalopathy). There was no seizure or seizure predisposition recorded on this study."  Antibiotics: None  Subjective: Patient remains agitated and confused. Does not communicate with me. Not fully cooperative with examination.  Objective: Vital Signs  Filed Vitals:   08/26/15 0442 08/26/15 1325 08/26/15 2200 08/27/15 0459  BP: 174/83 128/41 125/49 149/60    Pulse: 71 102 100 84  Temp: 98.1 F (36.7 C) 97.4 F (36.3 C) 98.2 F (36.8 C) 98.1 F (36.7 C)  TempSrc: Oral Oral Oral Axillary  Resp: 18 20 20 20   Height:      Weight:      SpO2: 92% 99% 98% 98%    Intake/Output Summary (Last 24 hours) at 08/27/15 0835 Last data filed at 08/27/15 0600  Gross per 24 hour  Intake   1860 ml  Output    800 ml  Net   1060 ml   Filed Weights   08/22/15 0600  Weight: 52.164 kg (115 lb)    General appearance: alert, agitated. no distress Resp: clear to auscultation bilaterally Cardio: regular rate and rhythm, S1, S2 normal, no murmur, click, rub or gallop GI: soft, non-tender; bowel sounds normal; no masses,  no organomegaly Extremities: extremities normal, atraumatic, no cyanosis or edema Neurologic: She is awake. Delirious. Not communicative. Moving all her extremities.  Lab Results:  Basic Metabolic Panel:  Recent Labs Lab 08/22/15 0103 08/23/15 0740 08/24/15 0452 08/25/15 0350 08/26/15 0337  NA 141 140 140 142 142  K 4.8 4.0 3.9 3.6 3.7  CL 104 104 104 105 106  CO2 28 27 24 26 25   GLUCOSE 136* 166* 172* 179* 155*  BUN 30* 17 18 14 14   CREATININE 1.49* 1.20* 1.11* 1.23* 1.09*  CALCIUM 9.0 8.8* 8.5* 9.0 8.9   Liver Function Tests:  Recent Labs Lab 08/22/15 0103  AST 19  ALT 17  ALKPHOS 97  BILITOT 0.6  PROT 6.6  ALBUMIN 3.1*    Recent Labs Lab  08/22/15 0103  AMMONIA 11   CBC:  Recent Labs Lab 08/22/15 0103  08/22/15 1429 08/23/15 0740 08/24/15 0452 08/25/15 0350 08/26/15 0337  WBC 7.7  --  4.5 3.1* 2.5* 6.0 4.1  NEUTROABS 6.8  --   --   --   --  4.8  --   HGB 8.7*  --  9.1* 9.1* 8.6* 9.7* 9.5*  HCT 27.9*  < > 28.8* 28.5* 27.1* 30.6* 29.8*  MCV 98.6  --  96.0 95.0 96.1 96.5 95.5  PLT 198  --  193 191 178 207 194  < > = values in this interval not displayed. Cardiac Enzymes:  Recent Labs Lab 08/22/15 0418  TROPONINI <0.03    CBG:  Recent Labs Lab 08/26/15 0728 08/26/15 1148  08/26/15 1645 08/26/15 2216 08/27/15 0719  GLUCAP 180* 215* 139* 130* 109*    Recent Results (from the past 240 hour(s))  Urine culture     Status: None (Preliminary result)   Collection Time: 08/25/15  6:31 PM  Result Value Ref Range Status   Specimen Description URINE, CLEAN CATCH  Final   Special Requests NONE  Final   Culture   Final    TOO YOUNG TO READ Performed at [facility]    Report Status PENDING  Incomplete      Studies/Results: No results found.  Medications:  Scheduled: . feeding supplement (ENSURE ENLIVE)  237 mL Oral TID WC  . heparin  5,000 Units Subcutaneous 3 times per day  . multivitamin with minerals  1 tablet Oral Daily  . pantoprazole (PROTONIX) IV  40 mg Intravenous Q12H  . QUEtiapine  25 mg Oral BID  . QUEtiapine  25 mg Oral Daily  . rosuvastatin  20 mg Oral Daily   Continuous: . sodium chloride 75 mL/hr at 08/26/15 2000   [id number], food thickener, hydrALAZINE, LORazepam, RESOURCE THICKENUP CLEAR, senna  Assessment/Plan:  Principal Problem:   Dementia of Alzheimer's type with behavioral disturbance Active Problems:   Insulin dependent diabetes mellitus (HCC)   HTN (hypertension)   CKD (chronic kidney disease) stage 3, GFR 30-59 ml/min   Altered mental status   Acute encephalopathy   Normochromic normocytic anemia   Malnutrition of moderate degree    Acute encephalopathy in the setting of known dementia Evaluation in the hospital so far has included a CT head, UA, TSH, ammonia B-12, RPR, MRI brain, EEG. These test have been unremarkable. She has been seen by neurology and psychiatry. She hasn't been found to have any reversible etiology for her symptoms. This is most likely progression of her dementia. Seroquel was added and frequency was increased. She was also started on Ativan that she was taking prior to hospitalization. Despite these measures, her mental status hasn't improved. She continues to have very poor oral  intake. Discussed with the patient's husband today. Proceed with palliative medicine consult. Tried calling her daughter, with no success so far.   Insulin-dependent DM  Monitor CBGs. HbA1c is 7.5. Oral intake remains poor.  History of essential Hypertension  Holding oral medications. IV hydralazine as needed.  Mild acute on CKD stage III  SCr 1.49 on arrival. Improved with IV fluids.  Normocytic anemia  Hemoglobin remained stable. No evidence for overt bleeding.  Leukopenia Might be related to medications, like navane. Now resolved.  Abdominal Pain KUB negative, abdominal exam benign. LFTs are normal. No further episodes of pain.   DVT Prophylaxis: Subcutaneous heparin    Code Status: DO NOT RESUSCITATE.  Family  Communication: Discussed with her husband Emya Phair in detail  Disposition Plan: Proceed with palliative medicine consult for goals of care.     LOS: 5 days   [place] [facility] Pager [phone] 08/27/2015, 8:35 AM  If 7PM-7AM, please contact night-coverage at [email or web address], password [facility]

## 2015-08-28 DIAGNOSIS — Z515 Encounter for palliative care: Secondary | ICD-10-CM | POA: Insufficient documentation

## 2015-08-28 DIAGNOSIS — Z7189 Other specified counseling: Secondary | ICD-10-CM | POA: Insufficient documentation

## 2015-08-28 LAB — BASIC METABOLIC PANEL
Anion gap: 12 (ref 5–15)
BUN: 13 mg/dL (ref 6–20)
CHLORIDE: 105 mmol/L (ref 101–111)
CO2: 23 mmol/L (ref 22–32)
CREATININE: 1.13 mg/dL — AB (ref 0.44–1.00)
Calcium: 8.7 mg/dL — ABNORMAL LOW (ref 8.9–10.3)
GFR calc non Af Amer: 44 mL/min — ABNORMAL LOW (ref >60)
GFR, EST AFRICAN AMERICAN: 51 mL/min — AB (ref >60)
GLUCOSE: 127 mg/dL — AB (ref 65–99)
Potassium: 3.6 mmol/L (ref 3.5–5.1)
Sodium: 140 mmol/L (ref 135–145)

## 2015-08-28 LAB — GLUCOSE, CAPILLARY
GLUCOSE-CAPILLARY: 125 mg/dL — AB (ref 65–99)
GLUCOSE-CAPILLARY: 191 mg/dL — AB (ref 65–99)
Glucose-Capillary: 112 mg/dL — ABNORMAL HIGH (ref 65–99)
Glucose-Capillary: 105 mg/dL — ABNORMAL HIGH (ref 65–99)

## 2015-08-28 NOTE — Progress Notes (Signed)
Occupational Therapy Treatment Patient Details Name: Kristina [name] MRN: [id number] DOB: 09-Dec-1930 Today's Date: 08/28/2015    History of present illness 82 y.o. female with PMH of insulin-dependent diabetes mellitus, hypertension, DVT, PE, chronic kidney disease stage III, and Alzheimer's dementia with behavioral disturbance who presents from her ALF with acute encephalopathy.     OT comments  Pt much more alert and participated well in therapy session.  Met grooming goal.  Needs +2 for safety  Follow Up Recommendations  SNF    Equipment Recommendations  None recommended by OT    Recommendations for Other Services      Precautions / Restrictions Precautions Precautions: Fall Restrictions Weight Bearing Restrictions: No       Mobility Bed Mobility         Supine to sit: Mod assist;+2 for safety/equipment     General bed mobility comments: tactile cues to move legs and physical assist to clear LLE from rail.  Pt pulled up on therapist to sit up  Transfers Overall transfer level: Needs assistance Equipment used: 2 person hand held assist;Rolling walker (2 wheeled) (and use of IV)   Sit to Stand: Mod assist Stand pivot transfers: Mod assist       General transfer comment: performed transfer to 3:1 then bed for rest and at end of session.  Multimodal cues given.  assist to rise; assist to position feet under her, and assist to turn completely.  Pt doesn't lean forward when sitting--slowed descent    Balance                                   ADL       Grooming: Wash/dry hands;Wash/dry face;Brushing hair (see comments below)                   Toilet Transfer: Moderate assistance;+2 for safety/equipment;Stand-pivot;Ambulation             General ADL Comments: pt sat on 3:1 commode but did not have results.  Said she wanted to go to toilet. Walked to bathroom, but she would not sit on commode. Stood at sink and washed hands with min A for  activity.  Washed face with set up/min guard for balance. Pt is very unpredictable, +2 needed for safety.  Sat on bed and combed hair with min A.  Tactile cues given.  Attempted use of walker, but pt let go several times. She did better with IV pole. When in bathroom, pt became quiet; guided her back to bed.  When BP cuff found, pt was smiling, chatting and back to baseline  Walked in hall with min +2 assistance and tolerated well      Vision                     Perception     Praxis      Cognition   Behavior During Therapy: [facility] for tasks assessed/performed Overall Cognitive Status: History of cognitive impairments - at baseline                       Extremity/Trunk Assessment               Exercises     Shoulder Instructions       General Comments      Pertinent Vitals/ Pain       Pain Assessment: Faces Faces Pain Scale:  No hurt  Home Living                                          Prior Functioning/Environment              Frequency Min 2X/week     Progress Toward Goals  OT Goals(current goals can now be found in the care plan section)  Progress towards OT goals: Progressing toward goals     Plan      Co-evaluation    PT/OT/SLP Co-Evaluation/Treatment: Yes Reason for Co-Treatment: For patient/therapist safety PT goals addressed during session: Mobility/safety with mobility OT goals addressed during session: ADL's and self-care      End of Session     Activity Tolerance Patient tolerated treatment well   Patient Left in bed;with call bell/phone within reach;with restraints reapplied   Nurse Communication  (check restraints)        Time: 1445-1515 OT Time Calculation (min): 30 min  Charges: OT General Charges $OT Visit: 1 Procedure OT Treatments $Self Care/Home Management : 8-22 mins  Laquashia Mergenthaler 08/28/2015, 3:49 PM   Lesle Chris, OTR/L [phone] 08/28/2015

## 2015-08-28 NOTE — Progress Notes (Signed)
Physical Therapy Treatment Patient Details Name: Kristina [name] MRN: [id number] DOB: 10-03-1930 Today's Date: 08/28/2015    History of Present Illness 82 y.o. female with PMH of insulin-dependent diabetes mellitus, hypertension, DVT, PE, chronic kidney disease stage III, and Alzheimer's dementia with behavioral disturbance who presents from her ALF with acute encephalopathy.      PT Comments    Pt assisted OOB to bathroom and then ambulated in hallway.  Pt requires multimodal cues and frequent redirection.  Follow Up Recommendations  No PT follow up;Supervision/Assistance - 24 hour (likely requires custodial care)     Equipment Recommendations  None recommended by PT    Recommendations for Other Services       Precautions / Restrictions Precautions Precautions: Fall Restrictions Weight Bearing Restrictions: No    Mobility  Bed Mobility Overal bed mobility: Needs Assistance Bed Mobility: Supine to Sit;Sit to Supine     Supine to sit: Mod assist;+2 for safety/equipment Sit to supine: Total assist;+2 for safety/equipment   General bed mobility comments: tactile cues to move legs and physical assist to clear LLE from rail.  Pt pulled up on therapist to sit up, required more assist back to bed likely due to cognition  Transfers Overall transfer level: Needs assistance Equipment used: 2 person hand held assist;Rolling walker (2 wheeled) (and use of IV) Transfers: Sit to/from Stand Sit to Stand: Mod assist Stand pivot transfers: Mod assist       General transfer comment: multimodal cues provided due to cognition, assist to rise; assist to position feet under her, and assist to turn completely.  Pt doesn't lean forward when sitting even when cued at hips--slowed descent, attempted using RW however pt would not continue holding  Ambulation/Gait Ambulation/Gait assistance: Min assist;+2 safety/equipment Ambulation Distance (Feet): 60 Feet Assistive device: 2 person hand  held assist (and IV pole) Gait Pattern/deviations: Step-through pattern;Decreased stride length;Drifts right/left;Narrow base of support     General Gait Details: very narrow BOS and small steps, multimodal cues for directions   Stairs            Wheelchair Mobility    Modified Rankin (Stroke Patients Only)       Balance Overall balance assessment: History of Falls             Standing balance comment: pt tends to reach for floor, socks, objects in bathroom requiring min/guard and redirection for safety                    Cognition Arousal/Alertness: Awake/alert Behavior During Therapy: WFL for tasks assessed/performed Overall Cognitive Status: History of cognitive impairments - at baseline                      Exercises      General Comments        Pertinent Vitals/Pain Pain Assessment: Faces Faces Pain Scale: No hurt    Home Living                      Prior Function            PT Goals (current goals can now be found in the care plan section) Acute Rehab PT Goals PT Goal Formulation: Patient unable to participate in goal setting Time For Goal Achievement: 09/09/2015 Potential to Achieve Goals: Fair Progress towards PT goals: Progressing toward goals    Frequency  Min 1X/week    PT Plan Current plan remains appropriate    Co-evaluation  Reason for Co-Treatment: For patient/therapist safety PT goals addressed during session: Mobility/safety with mobility OT goals addressed during session: ADL's and self-care     End of Session   Activity Tolerance: Patient tolerated treatment well Patient left: in bed;with call bell/phone within reach;with bed alarm set;with restraints reapplied     Time: 1443-1513 PT Time Calculation (min) (ACUTE ONLY): 30 min  Charges:  $Gait Training: 8-22 mins                    G Codes:      Bianca Vester,KATHrine E 2015-09-12, 4:16 PM Carmelia Bake, PT, DPT 09/12/2015 Pager: [phone]

## 2015-08-28 NOTE — Consult Note (Signed)
Consultation Note Date: 08/28/2015   Patient Name: Kristina [name]  DOB: [birth year]  MRN: [id number]  Age / Sex: 82 y.o., female  PCP: Rulon Eisenmenger, NP Referring Physician: Bonnielee Haff, MD  Reason for Consultation: Establishing goals of care    Clinical Assessment/Narrative: Kristina [name] is a 82 y.o. female with PMH of insulin-dependent diabetes mellitus, chronic kidney disease stage III, and Alzheimer's dementia with behavioral disturbance who presents from her SNF with a decreased level of consciousness. SNF personnel suspected hypoglycemia when patient could not be roused and activated EMS. CBG was 81 on the scene and patient was reportedly very resistant to EMS attempts at care. Per report, the patient is typically verbal, though confused, and assists with her feeds. Prior to arrival, the patient was reportedly not opening her eyes or giving any response except withdrawing from pain. Evaluation in the hospital has not been conclusive for any reversible etiology for her agitation: Evaluation in the hospital so far has included a CT head, UA, TSH, ammonia B-12, RPR, MRI brain, EEG. These test have been unremarkable. She has been seen by neurology and psychiatry. She hasn't been found to have any reversible etiology for her symptoms. This is most likely progression of her dementia. Seroquel was added and frequency was increased. She was also started on Ativan that she was taking prior to hospitalization. Patient remains confused, although less agitated currently. She continues to have very poor oral intake.   Palliative medicine input requested for goals of care discussions.  The patient is resting in bed, she is a thin frail elderly lady with hand mittens/ soft restraints on. She is in no distress, not agitated. Her husband is present at the bedside.   The patient has been having falls at the [facility] prior  to this hospitalization. She has had gradual decline in Po intake.  Discussed with patient's husband in detail about goals: DNR DNI, no PEG tube. Continue with current treatments.   Discussed with SW, EPIC chart notes that the patient has a legal guardian, this is noted to be inaccurate, the patient's demographics section lists husband and daughter as next of kin.   The patient's husband states that he is by himself at home. He has 3 children but they are not available to help him at home should be decide to bring the patient home with hospice towards the ned of this hospitalization.   Discussed disposition options with patient's husband. While there are efforts being made to send the patient to [facility], would recommend addition of Hospice services. Pending hospice consult, we can decide if the patient meets criteria for residential hospice.    Contacts/Participants in Discussion: Primary Decision Maker:  Husband, daughter Relationship to Patient   HCPOA: yes     SUMMARY OF RECOMMENDATIONS: DNR DNI. No PEG tube.   Agree with Seroquel. May need to titrate dose by 08-29-15. Will monitor.   Disposition: [place] with Hospice versus Hospice home. Continue to monitor trajectory/PO intake/ symptoms of agitation etc. to determine. Will request Hospice consult on 08-29-15 for consideration for residential hospice.   Prognosis: with minimal to nil PO intake, and ongoing decline from a dementia standpoint, prognosis certainly appears to be 2 weeks.    Code Status/Advance Care Planning: DNR    Code Status Orders        Start     Ordered   08/26/15 1121  Do not attempt resuscitation (DNR)   Continuous    Question Answer Comment  In the event of cardiac or respiratory ARREST Do not call a "code blue"   In the event of cardiac or respiratory ARREST Do not perform Intubation, CPR, defibrillation or ACLS   In the event of cardiac or respiratory ARREST Use medication by any route, position,  wound care, and other measures to relive pain and suffering. May use oxygen, suction and manual treatment of airway obstruction as needed for comfort.      08/26/15 1120    Code Status History    Date Active Date Inactive Code Status Order ID Comments User Context   08/22/2015  4:04 AM 08/26/2015 11:20 AM Full Code [id number]  Vianne Bulls, MD ED   04/20/2015  7:56 PM 04/22/2015  1:03 AM Full Code [id number]  Leonard Schwartz, MD ED   04/10/2015  7:52 PM 04/14/2015  1:58 PM Full Code [id number]  Leonard Schwartz, MD ED   10/15/2012 10:58 PM 10/21/2012  2:14 PM Full Code [id number]  Toy Baker, MD Inpatient      Other Directives:Other  Symptom Management:    continue with Seroquel 25 mg for now.   Palliative Prophylaxis:   Delirium Protocol  Additional Recommendations (Limitations, Scope, Preferences):  See above     Psycho-social/Spiritual:  Support System: [place] Desire for further Chaplaincy support:no Additional Recommendations: Education on Hospice  Prognosis: < 2 weeks  Discharge Planning:  [place] with Hospice versus Hospice home. Continue to monitor trajectory/PO intake/ symptoms of agitation etc. to determine. Will request Hospice consult on 08-29-15 for consideration for residential hospice.   Chief Complaint/ Primary Diagnoses: Present on Admission:  . Altered mental status . CKD (chronic kidney disease) stage 3, GFR 30-59 ml/min . Dementia of Alzheimer's type with behavioral disturbance . HTN (hypertension) . Acute encephalopathy  I have reviewed the medical record, interviewed the patient and family, and examined the patient. The following aspects are pertinent.  Past Medical History  Diagnosis Date  . Diabetes mellitus   . Hypertension   . Renal disorder   . Cancer ([place]) 2000    sp bilateral masectomy, sp chemo  . DVT (deep venous thrombosis) ([place])   . PE (pulmonary embolism)   . Dementia   . GERD (gastroesophageal reflux disease)   . Dehydration   .  Hypokalemia   . GERD (gastroesophageal reflux disease)   . Fracture of ramus of mandible ([place])   . Hyperlipidemia   . Alzheimer disease   . Chronic kidney disease (CKD), stage III (moderate)    Social History   Social History  . Marital Status: Married    Spouse Name: N/A  . Number of Children: N/A  . Years of Education: N/A   Social History Main Topics  . Smoking status: Never Smoker   . Smokeless tobacco: None  . Alcohol Use: No  . Drug Use: No  . Sexual Activity: Not Asked   Other Topics Concern  . None   Social History Narrative   Family History  Problem Relation Age of Onset  . Hypertension Mother   . Diabetes Mellitus II Father    Scheduled Meds: . feeding supplement (ENSURE ENLIVE)  237 mL Oral TID WC  . heparin  5,000 Units Subcutaneous 3 times per day  . multivitamin with minerals  1 tablet Oral Daily  . pantoprazole (PROTONIX) IV  40 mg Intravenous Q12H  . QUEtiapine  25 mg Oral BID  . QUEtiapine  25 mg Oral Daily  . rosuvastatin  20 mg Oral Daily   Continuous  Infusions: . sodium chloride 75 mL/hr at 08/28/15 1245   PRN Meds:.acetaminophen, food thickener, hydrALAZINE, LORazepam, RESOURCE THICKENUP CLEAR, senna Medications Prior to Admission:  Prior to Admission medications   Medication Sig Start Date End Date Taking? Authorizing Provider  acetaminophen (TYLENOL) 325 MG tablet Take 325 mg by mouth 3 (three) times daily.   Yes Historical Provider, MD  alum & mag hydroxide-simeth (MAALOX/MYLANTA) 200-200-20 MG/5ML suspension Take 30 mLs by mouth every 6 (six) hours as needed for indigestion or heartburn.   Yes Historical Provider, MD  calcitRIOL (ROCALTROL) 0.25 MCG capsule Take 1 capsule (0.25 mcg total) by mouth daily. 04/14/15  Yes Courteney Lyn Mackuen, MD  candesartan (ATACAND) 4 MG tablet Take 4 mg by mouth daily. 07/16/15 07/15/16 Yes Historical Provider, MD  feeding supplement ([place]) LIQD Take 237 mLs by mouth 3 (three) times daily  with meals.   Yes Historical Provider, MD  ferrous sulfate 325 (65 FE) MG tablet Take 325 mg by mouth 2 (two) times daily. 07/16/15 07/15/16 Yes Historical Provider, MD  gabapentin (NEURONTIN) 100 MG capsule Take 2 capsules (200 mg total) by mouth 2 (two) times daily. 08/05/15  Yes Delfin Gant, NP  guaifenesin (ROBITUSSIN) 100 MG/5ML syrup Take 200 mg by mouth 4 (four) times daily as needed for cough.   Yes Historical Provider, MD  insulin aspart (NOVOLOG) 100 UNIT/ML injection Inject 2-6 Units into the skin 4 (four) times daily as needed for high blood sugar. Blood glucose 150-200=2 units, 201-250=4 units, 251-300=6units   Yes Historical Provider, MD  loperamide (IMODIUM) 2 MG capsule Take 2 mg by mouth daily as needed for diarrhea or loose stools.   Yes Historical Provider, MD  LORazepam (ATIVAN) 0.5 MG tablet Take 1 tablet (0.5 mg total) by mouth 2 (two) times daily. Patient taking differently: Take 0.5 mg by mouth 2 (two) times daily. 2PM AND 8PM 08/05/15  Yes Delfin Gant, NP  magnesium hydroxide (MILK OF MAGNESIA) 400 MG/5ML suspension Take 30 mLs by mouth daily as needed for mild constipation.   Yes Historical Provider, MD  memantine (NAMENDA) 10 MG tablet Take 1 tablet (10 mg total) by mouth 2 (two) times daily. 04/14/15  Yes Courteney Lyn Mackuen, MD  Multiple Vitamin (MULTIVITAMIN) tablet Take 1 tablet by mouth daily. 04/14/15  Yes Courteney Lyn Mackuen, MD  neomycin-bacitracin-polymyxin (NEOSPORIN) [phone] ointment Apply 1 application topically daily as needed (skin tears).   Yes Historical Provider, MD  pantoprazole (PROTONIX) 40 MG tablet Take 40 mg by mouth daily.   Yes Historical Provider, MD  potassium chloride SA (K-DUR,KLOR-CON) 20 MEQ tablet Take 20 mEq by mouth daily. 07/16/15  Yes Historical Provider, MD  rosuvastatin (CRESTOR) 20 MG tablet Take 1 tablet (20 mg total) by mouth daily. 04/14/15  Yes Courteney Lyn Mackuen, MD  thiothixene (NAVANE) 1 MG capsule Take 2 mg by  mouth 2 (two) times daily.    Yes Historical Provider, MD  venlafaxine (EFFEXOR) 75 MG tablet Take 1 tablet (75 mg total) by mouth 2 (two) times daily with a meal. 08/05/15  Yes Delfin Gant, NP  vitamin B-12 (CYANOCOBALAMIN) 1000 MCG tablet Take 1 tablet (1,000 mcg total) by mouth daily. 04/14/15  Yes Courteney Lyn Mackuen, MD   Allergies  Allergen Reactions  . Cortisone Other (See Comments)    Injection-altered mental status, didn't know her name, where she lived, etc.     . Oxycodone Other (See Comments)    Caused patient to go crazy  . Penicillins  Rash    Per MAR     Review of Systems Patient denies any acute symptoms at this time.   Physical Exam Generalized weakness, frail elderly lady Has hand mittens/ soft restraints S1 S2 Clear Abdomen soft No edema Is awake but confused in no distress  Vital Signs: BP 135/56 mmHg  Pulse 101  Temp(Src) 98.3 F (36.8 C) (Axillary)  Resp 20  Ht 5\' 5"  (1.651 m)  Wt 52.164 kg (115 lb)  BMI 19.14 kg/m2  SpO2 98%  SpO2: SpO2: 98 % O2 Device:SpO2: 98 % O2 Flow Rate: .   IO: Intake/output summary:  Intake/Output Summary (Last 24 hours) at 08/28/15 1432 Last data filed at 08/28/15 1400  Gross per 24 hour  Intake   1515 ml  Output    400 ml  Net   1115 ml    LBM: Last BM Date: 08/25/15 Baseline Weight: Weight: 52.164 kg (115 lb) Most recent weight: Weight: 52.164 kg (115 lb)      Palliative Assessment/Data:  Flowsheet Rows        Most Recent Value   Intake Tab    Referral Department  Hospitalist   Unit at Time of Referral  Med/Surg Unit   Palliative Care Primary Diagnosis  Neurology   Palliative Care Type  New Palliative care   Reason for referral  Clarify Goals of Care   Date first seen by Palliative Care  08/28/15   Clinical Assessment    Palliative Performance Scale Score  20%   Pain Max last 24 hours  3   Pain Min Last 24 hours  2   Dyspnea Max Last 24 Hours  3   Dyspnea Min Last 24 hours  2   Psychosocial &  Spiritual Assessment    Palliative Care Outcomes    Patient/Family meeting held?  Yes   Who was at the meeting?  husband    Palliative Care Outcomes  Clarified goals of care   Palliative Care follow-up planned  Yes, Facility      Additional Data Reviewed:  CBC:    Component Value Date/Time   WBC 4.1 08/26/2015 0337   WBC 5.9 12/16/2014 1000   HGB 9.5* 08/26/2015 0337   HGB 10.8* 12/16/2014 1000   HCT 29.8* 08/26/2015 0337   HCT 30.1* 08/22/2015 0657   HCT 33.0* 12/16/2014 1000   PLT 194 08/26/2015 0337   PLT 177 12/16/2014 1000   MCV 95.5 08/26/2015 0337   MCV 90.0 12/16/2014 1000   NEUTROABS 4.8 08/25/2015 0350   NEUTROABS 4.5 12/16/2014 1000   LYMPHSABS 0.8 08/25/2015 0350   LYMPHSABS 1.0 12/16/2014 1000   MONOABS 0.4 08/25/2015 0350   MONOABS 0.4 12/16/2014 1000   EOSABS 0.0 08/25/2015 0350   EOSABS 0.0 12/16/2014 1000   BASOSABS 0.0 08/25/2015 0350   BASOSABS 0.0 12/16/2014 1000   Comprehensive Metabolic Panel:    Component Value Date/Time   NA 140 08/28/2015 0840   NA 143 06/04/2014 1018   K 3.6 08/28/2015 0840   K 3.9 06/04/2014 1018   CL 105 08/28/2015 0840   CO2 23 08/28/2015 0840   CO2 31* 06/04/2014 1018   BUN 13 08/28/2015 0840   BUN 48.2* 06/04/2014 1018   CREATININE 1.13* 08/28/2015 0840   CREATININE 2.0* 06/04/2014 1018   GLUCOSE 127* 08/28/2015 0840   GLUCOSE 132 06/04/2014 1018   CALCIUM 8.7* 08/28/2015 0840   CALCIUM 10.4 06/04/2014 1018   AST 19 08/22/2015 0103   AST 16 12/26/2013 1046  ALT 17 08/22/2015 0103   ALT 12 12/26/2013 1046   ALKPHOS 97 08/22/2015 0103   ALKPHOS 54 12/26/2013 1046   BILITOT 0.6 08/22/2015 0103   BILITOT 0.34 12/26/2013 1046   PROT 6.6 08/22/2015 0103   PROT 7.2 12/26/2013 1046   ALBUMIN 3.1* 08/22/2015 0103   ALBUMIN 3.3* 12/26/2013 1046     Time In: 1300 Time Out: 1400 Time Total: 60 min  Greater than 50%  of this time was spent counseling and coordinating care related to the above assessment and  plan.  Signed by: Loistine Chance, MD [place], MD  08/28/2015, 2:32 PM  Please contact Palliative Medicine Team phone at [phone] for questions and concerns.

## 2015-08-28 NOTE — Progress Notes (Signed)
[facility] PROGRESS NOTE  Kristina [name] [id number] DOB: 01/28/1931 DOA: 08/21/2015  PCP: Rulon Eisenmenger, NP  Brief HPI: Kristina [name] is a 82 y.o. female with PMH of insulin-dependent diabetes mellitus, chronic kidney disease stage III, and Alzheimer's dementia with behavioral disturbance who presents from her SNF with a decreased level of consciousness. SNF personnel suspected hypoglycemia tonight when patient could not be roused and activated EMS. CBG was 81 on the scene and patient was reportedly very resistant to EMS attempts at care. Per report, the patient is typically verbal, though confused, and assists with her feeds. Prior to arrival, the patient was reportedly not opening her eyes or giving any response except withdrawing from pain. Evaluation in the hospital has not been conclusive for any reversible etiology for her agitation.  Past medical history:  Past Medical History  Diagnosis Date  . Diabetes mellitus   . Hypertension   . Renal disorder   . Cancer ([place]) 2000    sp bilateral masectomy, sp chemo  . DVT (deep venous thrombosis) ([place])   . PE (pulmonary embolism)   . Dementia   . GERD (gastroesophageal reflux disease)   . Dehydration   . Hypokalemia   . GERD (gastroesophageal reflux disease)   . Fracture of ramus of mandible ([place])   . Hyperlipidemia   . Alzheimer disease   . Chronic kidney disease (CKD), stage III (moderate)     Consultants: Neurology, psychiatry  Procedures:  EEG "Clinical Interpretation: This EEG is consistent with a generalized non-specific cerebral dysfunction(encephalopathy). There was no seizure or seizure predisposition recorded on this study."  Antibiotics: None  Subjective: Patient appears to be slightly less agitated today. Still not fully following commands. Not fully cooperative with examination.  Objective: Vital Signs  Filed Vitals:   08/27/15 0459 08/27/15 1545 08/27/15 2015 08/28/15 0612  BP: 149/60  122/52 156/61 153/65  Pulse: 84 87 96 94  Temp: 98.1 F (36.7 C) 98.1 F (36.7 C) 98.3 F (36.8 C) 98.2 F (36.8 C)  TempSrc: Axillary Axillary Axillary Axillary  Resp: 20 20 20 20   Height:      Weight:      SpO2: 98% 97% 100% 100%    Intake/Output Summary (Last 24 hours) at 08/28/15 0811 Last data filed at 08/27/15 2300  Gross per 24 hour  Intake   1275 ml  Output      0 ml  Net   1275 ml   Filed Weights   08/22/15 0600  Weight: 52.164 kg (115 lb)    General appearance: alert, confused. no distress Resp: clear to auscultation bilaterally Cardio: regular rate and rhythm, S1, S2 normal, no murmur, click, rub or gallop GI: soft, non-tender; bowel sounds normal; no masses,  no organomegaly Neurologic: She is awake. Disoriented, although she did tell me she was in [place]. Not communicative. Moving all her extremities.  Lab Results:  Basic Metabolic Panel:  Recent Labs Lab 08/22/15 0103 08/23/15 0740 08/24/15 0452 08/25/15 0350 08/26/15 0337  NA 141 140 140 142 142  K 4.8 4.0 3.9 3.6 3.7  CL 104 104 104 105 106  CO2 28 27 24 26 25   GLUCOSE 136* 166* 172* 179* 155*  BUN 30* 17 18 14 14   CREATININE 1.49* 1.20* 1.11* 1.23* 1.09*  CALCIUM 9.0 8.8* 8.5* 9.0 8.9   Liver Function Tests:  Recent Labs Lab 08/22/15 0103  AST 19  ALT 17  ALKPHOS 97  BILITOT 0.6  PROT 6.6  ALBUMIN 3.1*  Recent Labs Lab 08/22/15 0103  AMMONIA 11   CBC:  Recent Labs Lab 08/22/15 0103  08/22/15 1429 08/23/15 0740 08/24/15 0452 08/25/15 0350 08/26/15 0337  WBC 7.7  --  4.5 3.1* 2.5* 6.0 4.1  NEUTROABS 6.8  --   --   --   --  4.8  --   HGB 8.7*  --  9.1* 9.1* 8.6* 9.7* 9.5*  HCT 27.9*  < > 28.8* 28.5* 27.1* 30.6* 29.8*  MCV 98.6  --  96.0 95.0 96.1 96.5 95.5  PLT 198  --  193 191 178 207 194  < > = values in this interval not displayed.  Cardiac Enzymes:  Recent Labs Lab 08/22/15 0418  TROPONINI <0.03    CBG:  Recent Labs Lab 08/27/15 0719  08/27/15 1238 08/27/15 1744 08/27/15 2204 08/28/15 0738  GLUCAP 109* 130* 82 125* 112*    Recent Results (from the past 240 hour(s))  Urine culture     Status: None   Collection Time: 08/25/15  6:31 PM  Result Value Ref Range Status   Specimen Description URINE, CLEAN CATCH  Final   Special Requests NONE  Final   Culture   Final    MULTIPLE SPECIES PRESENT, SUGGEST RECOLLECTION Performed at [facility]    Report Status 08/27/2015 FINAL  Final      Studies/Results: No results found.  Medications:  Scheduled: . feeding supplement (ENSURE ENLIVE)  237 mL Oral TID WC  . heparin  5,000 Units Subcutaneous 3 times per day  . multivitamin with minerals  1 tablet Oral Daily  . pantoprazole (PROTONIX) IV  40 mg Intravenous Q12H  . QUEtiapine  25 mg Oral BID  . QUEtiapine  25 mg Oral Daily  . rosuvastatin  20 mg Oral Daily   Continuous: . sodium chloride 75 mL/hr at 08/27/15 2126   [id number], food thickener, hydrALAZINE, LORazepam, RESOURCE THICKENUP CLEAR, senna  Assessment/Plan:  Principal Problem:   Dementia of Alzheimer's type with behavioral disturbance Active Problems:   Insulin dependent diabetes mellitus (HCC)   HTN (hypertension)   CKD (chronic kidney disease) stage 3, GFR 30-59 ml/min   Altered mental status   Acute encephalopathy   Normochromic normocytic anemia   Malnutrition of moderate degree    Acute encephalopathy in the setting of known dementia Evaluation in the hospital so far has included a CT head, UA, TSH, ammonia B-12, RPR, MRI brain, EEG. These test have been unremarkable. She has been seen by neurology and psychiatry. She hasn't been found to have any reversible etiology for her symptoms. This is most likely progression of her dementia. Seroquel was added and frequency was increased. She was also started on Ativan that she was taking prior to hospitalization. Patient remains confused, although less agitated compared to yesterday.  She continues to have very poor oral intake. Await palliative medicine input. Discussed with patient's husband and daughter in detail yesterday.   Insulin-dependent DM  Monitor CBGs. HbA1c is 7.5. Oral intake remains poor.  History of essential Hypertension  Holding oral medications. IV hydralazine as needed.  Mild acute on CKD stage III  SCr 1.49 on arrival. Improved with IV fluids.  Normocytic anemia  Hemoglobin remained stable. No evidence for overt bleeding.  Leukopenia Might be related to medications, like navane. Now resolved.  Abdominal Pain KUB negative, abdominal exam benign. LFTs are normal. No further episodes of pain.   DVT Prophylaxis: Subcutaneous heparin    Code Status: DO NOT RESUSCITATE.  Family Communication: Discussed  with her husband Saryna Stremel and her daughter.  Disposition Plan: Await palliative medicine consult for goals of care.     LOS: 6 days   [facility] Pager [phone] 08/28/2015, 8:11 AM  If 7PM-7AM, please contact night-coverage at [email or web address], password [facility]

## 2015-08-29 DIAGNOSIS — G9340 Encephalopathy, unspecified: Secondary | ICD-10-CM | POA: Insufficient documentation

## 2015-08-29 LAB — GLUCOSE, CAPILLARY
Glucose-Capillary: 120 mg/dL — ABNORMAL HIGH (ref 65–99)
Glucose-Capillary: 94 mg/dL (ref 65–99)
Glucose-Capillary: 101 mg/dL — ABNORMAL HIGH (ref 65–99)

## 2015-08-29 MED ORDER — LORAZEPAM 2 MG/ML IJ SOLN
0.5000 mg | Freq: Four times a day (QID) | INTRAMUSCULAR | Status: DC | PRN
  Administered 2015-08-29 – 2015-08-30 (×6): 1 mg via INTRAVENOUS
  Filled 2015-08-29 (×6): qty 1

## 2015-08-29 NOTE — Progress Notes (Signed)
CSW notified by nursing that family is requesting residential hospice. CSW met with daughter Waymon Amato  [phone] as patient's husband defers to her for decision making. Discussed residential hospice homes and list provided.  First preference is [facility], the [facility], then [place].  If unable to obtain residential hospice- would request placement at [facility] with Hospice services. Explained to daughter that [facility] would not pay for hospice; patient has Medicaid. Weekday SW would need to follow up with [facility] on Monday if unable to obtain residential hospice.  Referrals completed to the above 3 hospices; current no beds at either [facility] or [facility];  Unsure about [facility].  Note:  Per daughter- patient is from [place].  Lorie [place]ix. Pauline Good, [place] (weekend coverage)

## 2015-08-29 NOTE — Progress Notes (Signed)
[facility] PROGRESS NOTE  Kristina [name] [id number] DOB: Oct 29, 1930 DOA: 08/21/2015  PCP: Rulon Eisenmenger, NP  Brief HPI: Kristina [name] is a 82 y.o. female with PMH of insulin-dependent diabetes mellitus, chronic kidney disease stage III, and Alzheimer's dementia with behavioral disturbance who presents from her SNF with a decreased level of consciousness. SNF personnel suspected hypoglycemia tonight when patient could not be roused and activated EMS. CBG was 81 on the scene and patient was reportedly very resistant to EMS attempts at care. Per report, the patient is typically verbal, though confused, and assists with her feeds. Prior to arrival, the patient was reportedly not opening her eyes or giving any response except withdrawing from pain. Evaluation in the hospital has not been conclusive for any reversible etiology for her agitation.  Past medical history:  Past Medical History  Diagnosis Date  . Diabetes mellitus   . Hypertension   . Renal disorder   . Cancer ([place]) 2000    sp bilateral masectomy, sp chemo  . DVT (deep venous thrombosis) ([place])   . PE (pulmonary embolism)   . Dementia   . GERD (gastroesophageal reflux disease)   . Dehydration   . Hypokalemia   . GERD (gastroesophageal reflux disease)   . Fracture of ramus of mandible ([place])   . Hyperlipidemia   . Alzheimer disease   . Chronic kidney disease (CKD), stage III (moderate)     Consultants: Neurology, psychiatry  Procedures:  EEG "Clinical Interpretation: This EEG is consistent with a generalized non-specific cerebral dysfunction(encephalopathy). There was no seizure or seizure predisposition recorded on this study."  Antibiotics: None  Subjective: Patient remains confused and agitated. Not following commands. Not cooperative with examination.  Objective: Vital Signs  Filed Vitals:   08/28/15 0612 08/28/15 1420 08/28/15 2026 08/29/15 0518  BP: 153/65 135/56 155/78 121/73  Pulse: 94  101 100 88  Temp: 98.2 F (36.8 C) 98.3 F (36.8 C) 98.1 F (36.7 C) 98.1 F (36.7 C)  TempSrc: Axillary Axillary Axillary Axillary  Resp: 20 20 19 18   Height:      Weight:      SpO2: 100% 98% 100% 99%    Intake/Output Summary (Last 24 hours) at 08/29/15 0755 Last data filed at 08/29/15 0520  Gross per 24 hour  Intake 1483.75 ml  Output    400 ml  Net 1083.75 ml   Filed Weights   08/22/15 0600  Weight: 52.164 kg (115 lb)    General appearance: alert. Remains confused and somewhat agitated. Resp: clear to auscultation bilaterally Cardio: regular rate and rhythm, S1, S2 normal, no murmur, click, rub or gallop GI: soft, non-tender; bowel sounds normal; no masses,  no organomegaly Neurologic: She is awake. Disoriented. Not communicative. Moving all her extremities.  Lab Results:  Basic Metabolic Panel:  Recent Labs Lab 08/23/15 0740 08/24/15 0452 08/25/15 0350 08/26/15 0337 08/28/15 0840  NA 140 140 142 142 140  K 4.0 3.9 3.6 3.7 3.6  CL 104 104 105 106 105  CO2 27 24 26 25 23   GLUCOSE 166* 172* 179* 155* 127*  BUN 17 18 14 14 13   CREATININE 1.20* 1.11* 1.23* 1.09* 1.13*  CALCIUM 8.8* 8.5* 9.0 8.9 8.7*   CBC:  Recent Labs Lab 08/22/15 1429 08/23/15 0740 08/24/15 0452 08/25/15 0350 08/26/15 0337  WBC 4.5 3.1* 2.5* 6.0 4.1  NEUTROABS  --   --   --  4.8  --   HGB 9.1* 9.1* 8.6* 9.7* 9.5*  HCT 28.8*  28.5* 27.1* 30.6* 29.8*  MCV 96.0 95.0 96.1 96.5 95.5  PLT 193 191 178 207 194    CBG:  Recent Labs Lab 08/27/15 1744 08/27/15 2204 08/28/15 0738 08/28/15 1242 08/28/15 1628  GLUCAP 82 125* 112* 105* 191*    Recent Results (from the past 240 hour(s))  Urine culture     Status: None   Collection Time: 08/25/15  6:31 PM  Result Value Ref Range Status   Specimen Description URINE, CLEAN CATCH  Final   Special Requests NONE  Final   Culture   Final    MULTIPLE SPECIES PRESENT, SUGGEST RECOLLECTION Performed at [facility]    Report  Status 08/27/2015 FINAL  Final      Studies/Results: No results found.  Medications:  Scheduled: . feeding supplement (ENSURE ENLIVE)  237 mL Oral TID WC  . heparin  5,000 Units Subcutaneous 3 times per day  . multivitamin with minerals  1 tablet Oral Daily  . pantoprazole (PROTONIX) IV  40 mg Intravenous Q12H  . QUEtiapine  25 mg Oral BID  . QUEtiapine  25 mg Oral Daily  . rosuvastatin  20 mg Oral Daily   Continuous: . sodium chloride 75 mL/hr at 08/29/15 0116   [id number], food thickener, hydrALAZINE, LORazepam, RESOURCE THICKENUP CLEAR, senna  Assessment/Plan:  Principal Problem:   Dementia of Alzheimer's type with behavioral disturbance Active Problems:   Insulin dependent diabetes mellitus (HCC)   HTN (hypertension)   CKD (chronic kidney disease) stage 3, GFR 30-59 ml/min   Altered mental status   Acute encephalopathy   Normochromic normocytic anemia   Malnutrition of moderate degree   Encounter for palliative care   Goals of care, counseling/discussion    Acute encephalopathy in the setting of known dementia Evaluation in the hospital so far has included a CT head, UA, TSH, ammonia B-12, RPR, MRI brain, EEG. These test have been unremarkable. She has been seen by neurology and psychiatry. She hasn't been found to have any reversible etiology for her symptoms. This is most likely progression of her dementia. Seroquel was added and frequency was increased. She was also started on Ativan that she was taking prior to hospitalization. Patient remains confused, although less agitated than before. She continues to have very poor oral intake. Seen by palliative medicine. Plan is for hospice. Depending on her clinical status over the next 24-48 hours, she may either go to a facility with hospice or to residential hospice.   Insulin-dependent DM  Monitor CBGs. HbA1c is 7.5. Oral intake remains poor.  History of essential Hypertension  Holding oral medications. IV  hydralazine as needed.  Mild acute on CKD stage III  SCr 1.49 on arrival. Improved with IV fluids.  Normocytic anemia  Hemoglobin remained stable. No evidence for overt bleeding.  Leukopenia Might be related to medications, like navane. Now resolved.  Abdominal Pain KUB negative, abdominal exam benign. LFTs are normal. No further episodes of pain.   DVT Prophylaxis: Subcutaneous heparin    Code Status: DO NOT RESUSCITATE.  Family Communication: Discussed with her husband Kristina [name] and her daughter 1/27.  Disposition Plan: Continue current management. Possible placement to either residential hospice or skilled nursing facility with hospice in the next 24-48 hours.    LOS: 7 days   [facility] Pager [phone] 08/29/2015, 7:55 AM  If 7PM-7AM, please contact night-coverage at [email or web address], password [facility]

## 2015-08-29 NOTE — Progress Notes (Signed)
Daily Progress Note   Patient Name: Kristina [name]       Date: 08/29/2015 DOB: 1930-08-11  Age: 82 y.o. MRN#: [id number] Attending Physician: Bonnielee Haff, MD Primary Care Physician: Rulon Eisenmenger, NP Admit Date: 08/21/2015  Reason for Consultation/Follow-up: Hospice Evaluation Life limiting illness: acute encephalopathy, dementia with rapid progressive decline, underlying CKD.   Subjective:  in restraints not eating Interval Events: Discussed in detail over the phone with daughter ZanZella: DNR DNI comfort care, no PEG tube.  Daughter is asking about residential hospice She does not want patient to go to SNF with hospice PLAN: Hospice consult for residential hospice, husband and daughter live nearby [employer]. Patient's 2 sons are also coming in from [place] and [place] area.  Daughter understands patient with ongoing decline, likely days to less than 2 weeks for possible prognosis.   Length of Stay: 7 days  Current Medications: Scheduled Meds:  . feeding supplement (ENSURE ENLIVE)  237 mL Oral TID WC  . heparin  5,000 Units Subcutaneous 3 times per day  . multivitamin with minerals  1 tablet Oral Daily  . pantoprazole (PROTONIX) IV  40 mg Intravenous Q12H  . QUEtiapine  25 mg Oral BID  . QUEtiapine  25 mg Oral Daily  . rosuvastatin  20 mg Oral Daily    Continuous Infusions: . sodium chloride 75 mL/hr at 08/29/15 0116    PRN Meds: acetaminophen, food thickener, hydrALAZINE, LORazepam, RESOURCE THICKENUP CLEAR, senna  Physical Exam: Physical Exam             Weak frail in restraints Opens eyes No edema No coolness no mottling evident Regular breathing S1 S2  Vital Signs: BP 121/73 mmHg  Pulse 88  Temp(Src) 98.1 F (36.7 C) (Axillary)  Resp 18   Ht 5\' 5"  (1.651 m)  Wt 52.164 kg (115 lb)  BMI 19.14 kg/m2  SpO2 99% SpO2: SpO2: 99 % O2 Device: O2 Device: Not Delivered O2 Flow Rate:    Intake/output summary:  Intake/Output Summary (Last 24 hours) at 08/29/15 1130 Last data filed at 08/29/15 1030  Gross per 24 hour  Intake 1871.25 ml  Output    200 ml  Net 1671.25 ml   LBM: Last BM Date: 08/25/15 Baseline Weight: Weight: 52.164 kg (115 lb) Most recent weight: Weight: 52.164 kg (115  lb)       Palliative Assessment/Data: Flowsheet Rows        Most Recent Value   Intake Tab    Referral Department  Hospitalist   Unit at Time of Referral  Med/Surg Unit   Palliative Care Primary Diagnosis  Neurology   Palliative Care Type  New Palliative care   Reason for referral  Clarify Goals of Care   Date first seen by Palliative Care  08/28/15   Clinical Assessment    Palliative Performance Scale Score  20%   Pain Max last 24 hours  3   Pain Min Last 24 hours  2   Dyspnea Max Last 24 Hours  3   Dyspnea Min Last 24 hours  2   Psychosocial & Spiritual Assessment    Palliative Care Outcomes    Patient/Family meeting held?  Yes   Who was at the meeting?  husband    Palliative Care Outcomes  Clarified goals of care   Palliative Care follow-up planned  Yes, Facility      Additional Data Reviewed: CBC    Component Value Date/Time   WBC 4.1 08/26/2015 0337   WBC 5.9 12/16/2014 1000   RBC 3.12* 08/26/2015 0337   RBC 3.67* 12/16/2014 1000   HGB 9.5* 08/26/2015 0337   HGB 10.8* 12/16/2014 1000   HCT 29.8* 08/26/2015 0337   HCT 30.1* 08/22/2015 0657   HCT 33.0* 12/16/2014 1000   PLT 194 08/26/2015 0337   PLT 177 12/16/2014 1000   MCV 95.5 08/26/2015 0337   MCV 90.0 12/16/2014 1000   MCH 30.4 08/26/2015 0337   MCH 29.3 12/16/2014 1000   MCHC 31.9 08/26/2015 0337   MCHC 32.6 12/16/2014 1000   RDW 13.9 08/26/2015 0337   RDW 14.5 12/16/2014 1000   LYMPHSABS 0.8 08/25/2015 0350   LYMPHSABS 1.0 12/16/2014 1000   MONOABS 0.4  08/25/2015 0350   MONOABS 0.4 12/16/2014 1000   EOSABS 0.0 08/25/2015 0350   EOSABS 0.0 12/16/2014 1000   BASOSABS 0.0 08/25/2015 0350   BASOSABS 0.0 12/16/2014 1000    CMP     Component Value Date/Time   NA 140 08/28/2015 0840   NA 143 06/04/2014 1018   K 3.6 08/28/2015 0840   K 3.9 06/04/2014 1018   CL 105 08/28/2015 0840   CO2 23 08/28/2015 0840   CO2 31* 06/04/2014 1018   GLUCOSE 127* 08/28/2015 0840   GLUCOSE 132 06/04/2014 1018   BUN 13 08/28/2015 0840   BUN 48.2* 06/04/2014 1018   CREATININE 1.13* 08/28/2015 0840   CREATININE 2.0* 06/04/2014 1018   CALCIUM 8.7* 08/28/2015 0840   CALCIUM 10.4 06/04/2014 1018   PROT 6.6 08/22/2015 0103   PROT 7.2 12/26/2013 1046   ALBUMIN 3.1* 08/22/2015 0103   ALBUMIN 3.3* 12/26/2013 1046   AST 19 08/22/2015 0103   AST 16 12/26/2013 1046   ALT 17 08/22/2015 0103   ALT 12 12/26/2013 1046   ALKPHOS 97 08/22/2015 0103   ALKPHOS 54 12/26/2013 1046   BILITOT 0.6 08/22/2015 0103   BILITOT 0.34 12/26/2013 1046   GFRNONAA 44* 08/28/2015 0840   GFRAA 51* 08/28/2015 0840       Problem List:  Patient Active Problem List   Diagnosis Date Noted  . Encounter for palliative care   . Goals of care, counseling/discussion   . Malnutrition of moderate degree 08/24/2015  . Altered mental status 08/22/2015  . Acute encephalopathy 08/22/2015  . Normochromic normocytic anemia   . Dementia in  Alzheimer's disease with early onset with behavioral disturbance 08/05/2015  . Dementia of Alzheimer's type with behavioral disturbance 08/05/2015  . Alzheimer's dementia 11/14/2014  . Depression 11/14/2014  . Pure hypercholesterolemia 03/01/2013  . DVT, lower extremity ([place]) 10/19/2012  . Dyspnea 10/15/2012  . Insulin dependent diabetes mellitus ([place]) 10/15/2012  . HTN (hypertension) 10/15/2012  . PE (pulmonary embolism) 10/15/2012  . CKD (chronic kidney disease) stage 3, GFR 30-59 ml/min 10/15/2012     Palliative Care Assessment & Plan     1.Code Status:  DNR    Code Status Orders        Start     Ordered   08/26/15 1121  Do not attempt resuscitation (DNR)   Continuous    Question Answer Comment  In the event of cardiac or respiratory ARREST Do not call a "code blue"   In the event of cardiac or respiratory ARREST Do not perform Intubation, CPR, defibrillation or ACLS   In the event of cardiac or respiratory ARREST Use medication by any route, position, wound care, and other measures to relive pain and suffering. May use oxygen, suction and manual treatment of airway obstruction as needed for comfort.      08/26/15 1120    Code Status History    Date Active Date Inactive Code Status Order ID Comments User Context   08/22/2015  4:04 AM 08/26/2015 11:20 AM Full Code [id number]  Vianne Bulls, MD ED   04/20/2015  7:56 PM 04/22/2015  1:03 AM Full Code [id number]  Leonard Schwartz, MD ED   04/10/2015  7:52 PM 04/14/2015  1:58 PM Full Code [id number]  Leonard Schwartz, MD ED   10/15/2012 10:58 PM 10/21/2012  2:14 PM Full Code [id number]  Toy Baker, MD Inpatient       2. Goals of Care/Additional Recommendations:     Limitations on Scope of Treatment: Full Comfort Care  Desire for further Chaplaincy support:no  Psycho-social Needs: Education on Hospice  3. Symptom Management:      1. As above   4. Palliative Prophylaxis:   Delirium Protocol  5. Prognosis: < 2 weeks  6. Discharge Planning:  Hospice facility   Care plan was discussed with  Patient's RN and daughter Ladona Mow over the phone.   Thank you for allowing the Palliative Medicine Team to assist in the care of this patient.   Time In: 11 Time Out: 1125 Total Time 25 Prolonged Time Billed  no        [id number] Loistine Chance, MD  08/29/2015, 11:30 AM  Please contact Palliative Medicine Team phone at [phone] for questions and concerns.

## 2015-08-30 LAB — GLUCOSE, CAPILLARY
GLUCOSE-CAPILLARY: 87 mg/dL (ref 65–99)
GLUCOSE-CAPILLARY: 67 mg/dL (ref 65–99)
Glucose-Capillary: 86 mg/dL (ref 65–99)
Glucose-Capillary: 85 mg/dL (ref 65–99)
Glucose-Capillary: 192 mg/dL — ABNORMAL HIGH (ref 65–99)
Glucose-Capillary: 64 mg/dL — ABNORMAL LOW (ref 65–99)

## 2015-08-30 MED ORDER — DEXTROSE 50 % IV SOLN
INTRAVENOUS | Status: AC
  Administered 2015-08-30: 1 mL
  Filled 2015-08-30: qty 50

## 2015-08-30 NOTE — Progress Notes (Signed)
Pt laying in bed resting comfortably. Writer attempted to give pt her Am medication and pt refused. Will continue to monitor.

## 2015-08-30 NOTE — Progress Notes (Signed)
CSW spoke with [facility], and [facility]- none have beds today- [facility] hopeful for bed availability Monday.  CSW will continue to follow  Domenica Reamer, [facility] Social Worker [phone]

## 2015-08-30 NOTE — Progress Notes (Signed)
Pt resting comfortably in bed. Writer offered pt water and pt refused. Will continue to monitor.

## 2015-08-30 NOTE — Progress Notes (Signed)
[facility] PROGRESS NOTE  Kristina [name] [id number] DOB: 08-Sep-1930 DOA: 08/21/2015  PCP: Rulon Eisenmenger, NP  Brief HPI: Kristina [name] is a 82 y.o. female with PMH of insulin-dependent diabetes mellitus, chronic kidney disease stage III, and Alzheimer's dementia with behavioral disturbance who presents from her SNF with a decreased level of consciousness. SNF personnel suspected hypoglycemia tonight when patient could not be roused and activated EMS. CBG was 81 on the scene and patient was reportedly very resistant to EMS attempts at care. Per report, the patient is typically verbal, though confused, and assists with her feeds. Prior to arrival, the patient was reportedly not opening her eyes or giving any response except withdrawing from pain. Evaluation in the hospital has not been conclusive for any reversible etiology for her agitation.  Past medical history:  Past Medical History  Diagnosis Date  . Diabetes mellitus   . Hypertension   . Renal disorder   . Cancer ([place]) 2000    sp bilateral masectomy, sp chemo  . DVT (deep venous thrombosis) ([place])   . PE (pulmonary embolism)   . Dementia   . GERD (gastroesophageal reflux disease)   . Dehydration   . Hypokalemia   . GERD (gastroesophageal reflux disease)   . Fracture of ramus of mandible ([place])   . Hyperlipidemia   . Alzheimer disease   . Chronic kidney disease (CKD), stage III (moderate)     Consultants: Neurology, psychiatry  Procedures:  EEG "Clinical Interpretation: This EEG is consistent with a generalized non-specific cerebral dysfunction(encephalopathy). There was no seizure or seizure predisposition recorded on this study."  Antibiotics: None  Subjective: Patient remains agitated and confused. No changes. Per nurse, she continues to have very poor oral intake.  Objective: Vital Signs  Filed Vitals:   08/28/15 2026 08/29/15 0518 08/29/15 2033 08/30/15 0501  BP: 155/78 121/73 159/71 159/68    Pulse: 100 88 88 91  Temp: 98.1 F (36.7 C) 98.1 F (36.7 C) 97.8 F (36.6 C) 97.9 F (36.6 C)  TempSrc: Axillary Axillary Axillary Axillary  Resp: 19 18 20 20   Height:      Weight:      SpO2: 100% 99% 96% 100%    Intake/Output Summary (Last 24 hours) at 08/30/15 0752 Last data filed at 08/30/15 0700  Gross per 24 hour  Intake   1925 ml  Output      0 ml  Net   1925 ml   Filed Weights   08/22/15 0600  Weight: 52.164 kg (115 lb)    General appearance: Remains confused and somewhat agitated. Resp: clear to auscultation bilaterally Cardio: regular rate and rhythm, S1, S2 normal, no murmur, click, rub or gallop GI: soft, non-tender; bowel sounds normal; no masses,  no organomegaly Neurologic: Disoriented. Not communicative. Moving all her extremities.  Lab Results:  Basic Metabolic Panel:  Recent Labs Lab 08/24/15 0452 08/25/15 0350 08/26/15 0337 08/28/15 0840  NA 140 142 142 140  K 3.9 3.6 3.7 3.6  CL 104 105 106 105  CO2 24 26 25 23   GLUCOSE 172* 179* 155* 127*  BUN 18 14 14 13   CREATININE 1.11* 1.23* 1.09* 1.13*  CALCIUM 8.5* 9.0 8.9 8.7*   CBC:  Recent Labs Lab 08/24/15 0452 08/25/15 0350 08/26/15 0337  WBC 2.5* 6.0 4.1  NEUTROABS  --  4.8  --   HGB 8.6* 9.7* 9.5*  HCT 27.1* 30.6* 29.8*  MCV 96.1 96.5 95.5  PLT 178 207 194    CBG:  Recent Labs Lab 08/29/15 0733 08/29/15 1153 08/29/15 1710 08/29/15 2038 08/30/15 0743  GLUCAP 120* 94 101* 86 85    Recent Results (from the past 240 hour(s))  Urine culture     Status: None   Collection Time: 08/25/15  6:31 PM  Result Value Ref Range Status   Specimen Description URINE, CLEAN CATCH  Final   Special Requests NONE  Final   Culture   Final    MULTIPLE SPECIES PRESENT, SUGGEST RECOLLECTION Performed at [facility]    Report Status 08/27/2015 FINAL  Final      Studies/Results: No results found.  Medications:  Scheduled: . feeding supplement (ENSURE ENLIVE)  237 mL Oral  TID WC  . heparin  5,000 Units Subcutaneous 3 times per day  . multivitamin with minerals  1 tablet Oral Daily  . pantoprazole (PROTONIX) IV  40 mg Intravenous Q12H  . QUEtiapine  25 mg Oral BID  . QUEtiapine  25 mg Oral Daily  . rosuvastatin  20 mg Oral Daily   Continuous: . sodium chloride 75 mL/hr at 08/30/15 0405   [id number], food thickener, hydrALAZINE, LORazepam, RESOURCE THICKENUP CLEAR, senna  Assessment/Plan:  Principal Problem:   Dementia of Alzheimer's type with behavioral disturbance Active Problems:   Insulin dependent diabetes mellitus (HCC)   HTN (hypertension)   CKD (chronic kidney disease) stage 3, GFR 30-59 ml/min   Altered mental status   Acute encephalopathy   Normochromic normocytic anemia   Malnutrition of moderate degree   Encounter for palliative care   Goals of care, counseling/discussion   Encephalopathy    Acute encephalopathy in the setting of known dementia Her presentation is due to progression of dementia. Hospice candidate per palliative medicine. Evaluation in the hospital so far has included a CT head, UA, TSH, ammonia B-12, RPR, MRI brain, EEG. These test have been unremarkable. She has been seen by neurology and psychiatry. She hasn't been found to have any reversible etiology for her symptoms. This is most likely progression of her dementia. Seroquel was added and frequency was increased. She was also started on Ativan that she was taking prior to hospitalization. Patient remains confused, although less agitated than before. She continues to have very poor oral intake. Seen by palliative medicine. Plan is for hospice. Social worker is following. Referrals made to residential hospice facilities.   Insulin-dependent DM  Monitor CBGs. HbA1c is 7.5. Oral intake remains poor.  History of essential Hypertension  Holding oral medications. IV hydralazine as needed.  Mild acute on CKD stage III  SCr 1.49 on arrival. Improved with IV  fluids.  Normocytic anemia  Hemoglobin remained stable. No evidence for overt bleeding.  Leukopenia Might be related to medications, like navane. Now resolved.  Abdominal Pain KUB negative, abdominal exam benign. LFTs are normal. No further episodes of pain.   DVT Prophylaxis: Subcutaneous heparin    Code Status: DO NOT RESUSCITATE.  Family Communication: Discussed with her husband Josilynn Olinski and her daughter 1/27.  Disposition Plan: Await placement to residential hospice facility.    LOS: 8 days   [facility] Pager [phone] 08/30/2015, 7:52 AM  If 7PM-7AM, please contact night-coverage at [email or web address], password [facility]

## 2015-08-30 NOTE — Progress Notes (Signed)
cbg 64, amp of dextrose given and cbg 192, Md notified.

## 2015-08-30 NOTE — Progress Notes (Signed)
Pt laying in bed resting comfortably. Waist restraint discontinued. Writer offered pt water and pt refused. Will continue to monitor.

## 2015-08-31 LAB — GLUCOSE, CAPILLARY
GLUCOSE-CAPILLARY: 114 mg/dL — AB (ref 65–99)
Glucose-Capillary: 86 mg/dL (ref 65–99)
Glucose-Capillary: 137 mg/dL — ABNORMAL HIGH (ref 65–99)

## 2015-08-31 MED ORDER — DEXTROSE 5 % IV SOLN
1000.0000 mL | INTRAVENOUS | Status: AC
Start: 2015-08-31 — End: ?

## 2015-08-31 MED ORDER — LORAZEPAM 2 MG/ML IJ SOLN: 0.5000 mg | Freq: Four times a day (QID) | INTRAMUSCULAR | Status: AC | PRN

## 2015-08-31 MED ORDER — QUETIAPINE FUMARATE 25 MG PO TABS
25.0000 mg | ORAL_TABLET | Freq: Three times a day (TID) | ORAL | Status: AC
Start: 2015-08-31 — End: ?

## 2015-08-31 MED ORDER — DEXTROSE 5 % IV SOLN
INTRAVENOUS | Status: DC
  Administered 2015-08-31: 09:00:00 via INTRAVENOUS

## 2015-08-31 NOTE — Discharge Summary (Signed)
[facility]  Physician Discharge Summary   Patient ID: Kristina [name] MRN: [id number] DOB/AGE: May 08, 1931 82 y.o.  Admit date: 08/21/2015 Discharge date: 08/31/2015  PCP: Rulon Eisenmenger, NP  DISCHARGE DIAGNOSES:  Principal Problem:   Dementia of Alzheimer's type with behavioral disturbance Active Problems:   Insulin dependent diabetes mellitus (HCC)   HTN (hypertension)   CKD (chronic kidney disease) stage 3, GFR 30-59 ml/min   Altered mental status   Acute encephalopathy   Normochromic normocytic anemia   Malnutrition of moderate degree   Encounter for palliative care   Goals of care, counseling/discussion   Encephalopathy   RECOMMENDATIONS FOR OUTPATIENT FOLLOW UP: 1. Patient is being discharged to residential hospice  DISCHARGE CONDITION: poor  Diet recommendation: Comfort feeding as tolerated. Preferably, dysphagia 1  Filed Weights   08/22/15 0600  Weight: 52.164 kg (115 lb)    INITIAL HISTORY: Kristina [name] is a 82 y.o. female with PMH of insulin-dependent diabetes mellitus, chronic kidney disease stage III, and Alzheimer's dementia with behavioral disturbance who presents from her SNF with a decreased level of consciousness. SNF personnel suspected hypoglycemia tonight when patient could not be roused and activated EMS. CBG was 81 on the scene and patient was reportedly very resistant to EMS attempts at care. Per report, the patient is typically verbal, though confused, and assists with her feeds. Prior to arrival, the patient was reportedly not opening her eyes or giving any response except withdrawing from pain. Evaluation in the hospital has not been conclusive for any reversible etiology for her agitation.  Consultants: Neurology, psychiatry  Procedures:  EEG "Clinical Interpretation: This EEG is consistent with a generalized non-specific cerebral dysfunction(encephalopathy). There was no seizure or seizure predisposition recorded on this  study."   HOSPITAL COURSE:   Acute encephalopathy in the setting of known dementia Her presentation is due to progression of dementia. Evaluation in the hospital has included a CT head, UA, TSH, ammonia B-12, RPR, MRI brain, EEG. These test have been unremarkable. She has been seen by neurology and psychiatry. She hasn't been found to have any reversible etiology for her symptoms. This is most likely progression of her dementia. Seroquel was added and frequency was increased. She was also started on Ativan that she was taking prior to hospitalization. Patient remains confused, although less agitated than before. She continues to have very poor oral intake. Seen by palliative medicine. Discussions were held with the family. She seems appropriate for residential hospice. Life expectancy, considering the fact that she is not eating or drinking much, is probably just days to 2-3 weeks at the most.  Insulin-dependent DM  HbA1c is 7.5. Oral intake remains poor. Episodes of hypoglycemia has been noted. D5 infusion was initiated. Off of her diabetic medications.  History of essential Hypertension  Holding oral medications. IV hydralazine as needed.  Mild acute on CKD stage III  SCr 1.49 on arrival. Improved with IV fluids.  Normocytic anemia  Hemoglobin remained stable. No evidence for overt bleeding.  Leukopenia Might be related to medications, like navane. Now resolved.  Abdominal Pain KUB negative, abdominal exam benign. LFTs are normal. No further episodes of pain.  Patient remains agitated and confused. Somewhat improved with Seroquel. Oral intake remains poor. Plan is for transfer to residential hospice.    PERTINENT LABS:  The results of significant diagnostics from this hospitalization (including imaging, microbiology, ancillary and laboratory) are listed below for reference.    Microbiology: Recent Results (from the past 240 hour(s))  Urine culture  Status: None    Collection Time: 08/25/15  6:31 PM  Result Value Ref Range Status   Specimen Description URINE, CLEAN CATCH  Final   Special Requests NONE  Final   Culture   Final    MULTIPLE SPECIES PRESENT, SUGGEST RECOLLECTION Performed at [facility]    Report Status 08/27/2015 FINAL  Final     Labs: Basic Metabolic Panel:  Recent Labs Lab 08/25/15 0350 08/26/15 0337 08/28/15 0840  NA 142 142 140  K 3.6 3.7 3.6  CL 105 106 105  CO2 26 25 23   GLUCOSE 179* 155* 127*  BUN 14 14 13   CREATININE 1.23* 1.09* 1.13*  CALCIUM 9.0 8.9 8.7*   CBC:  Recent Labs Lab 08/25/15 0350 08/26/15 0337  WBC 6.0 4.1  NEUTROABS 4.8  --   HGB 9.7* 9.5*  HCT 30.6* 29.8*  MCV 96.5 95.5  PLT 207 194    CBG:  Recent Labs Lab 08/30/15 1647 08/30/15 1715 08/30/15 2153 08/31/15 0443 08/31/15 0805  GLUCAP 64* 192* 67 86 114*     IMAGING STUDIES  Dg Abd 1 View  08/23/2015  CLINICAL DATA:  Abdominal pain. EXAM: ABDOMEN - 1 VIEW COMPARISON:  CT of the abdomen pelvis 05/08/2013 FINDINGS: The bowel gas pattern is normal. There is moderate amount of stool throughout the colon. No evidence of organomegaly. No radio-opaque calculi or other significant radiographic abnormality are seen. Bridging enthesophyte is seen at L2-L3. IMPRESSION: Nonobstructive bowel gas pattern. Electronically Signed   By: Fidela Salisbury M.D.   On: 08/23/2015 16:42   Ct Head Wo Contrast  08/22/2015  CLINICAL DATA:  Acute onset of altered mental status. Initial encounter. EXAM: CT HEAD WITHOUT CONTRAST TECHNIQUE: Contiguous axial images were obtained from the base of the skull through the vertex without intravenous contrast. COMPARISON:  CT of the head performed 08/16/2015 FINDINGS: There is no evidence of acute infarction, mass lesion, or intra- or extra-axial hemorrhage on CT. Periventricular white matter change likely reflects small vessel ischemic microangiopathy. The posterior fossa, including the cerebellum,  brainstem and fourth ventricle, is within normal limits. The third and lateral ventricles, and basal ganglia are unremarkable in appearance. The cerebral hemispheres are symmetric in appearance, with normal gray-white differentiation. No mass effect or midline shift is seen. There is no evidence of fracture; visualized osseous structures are unremarkable in appearance. The orbits are within normal limits. The paranasal sinuses and mastoid air cells are well-aerated. No significant soft tissue abnormalities are seen. IMPRESSION: 1. No acute intracranial pathology seen on CT. 2. Mild small vessel ischemic microangiopathy. Electronically Signed   By: Garald Balding M.D.   On: 08/22/2015 00:55    Mr Brain Wo Contrast  08/22/2015  CLINICAL DATA:  82 year old diabetic hypertensive female with chronic kidney disease and Alzheimer's presenting with decreased level of consciousness. Subsequent encounter. EXAM: MRI HEAD WITHOUT CONTRAST TECHNIQUE: Multiplanar, multiecho pulse sequences of the brain and surrounding structures were obtained without intravenous contrast. COMPARISON:  08/22/2015 CT. FINDINGS: Exam is motion degraded. No acute infarct or intracranial hemorrhage. Mild chronic small vessel disease changes. Global mild atrophy without hydrocephalus. No intracranial mass lesion noted on this unenhanced exam. Major intracranial vascular structures are patent. Post lens replacement otherwise orbital structures unremarkable. Mild cervical spondylotic changes upper cervical spine. Minimal mucosal thickening inferior right maxillary sinus. IMPRESSION: Exam is motion degraded. No acute infarct or intracranial hemorrhage. Mild chronic small vessel disease changes. Global mild atrophy without hydrocephalus. No intracranial mass lesion noted on this unenhanced  exam. Electronically Signed   By: Genia Del M.D.   On: 08/22/2015 13:22   Dg Chest Port 1 View  08/24/2015  CLINICAL DATA:  Dementia and Cough.  Diabetes and  hypertension. EXAM: PORTABLE CHEST 1 VIEW COMPARISON:  08/22/2015 FINDINGS: Midline trachea. Normal heart size and mediastinal contours. No pleural effusion or pneumothorax. Clear lungs. Skin fold over the chest bilaterally. IMPRESSION: No acute cardiopulmonary disease. Electronically Signed   By: Abigail Miyamoto M.D.   On: 08/24/2015 16:30   Dg Chest Port 1 View  08/22/2015  CLINICAL DATA:  Altered mental status.  Initial encounter. EXAM: PORTABLE CHEST 1 VIEW COMPARISON:  Chest radiograph performed 08/03/2015 FINDINGS: The lungs are well-aerated. Mild vascular congestion is noted. Mild peribronchial thickening is seen. There is no evidence of focal opacification, pleural effusion or pneumothorax. An apparent small calcification is noted overlying the left lower lung zone. The cardiomediastinal silhouette is within normal limits. No acute osseous abnormalities are seen. There is chronic superior subluxation of the right humeral head, with underlying degenerative change. IMPRESSION: Mild vascular congestion noted. Mild peribronchial thickening seen. Lungs otherwise grossly clear. Electronically Signed   By: Garald Balding M.D.   On: 08/22/2015 00:54     DISCHARGE EXAMINATION: Filed Vitals:   08/29/15 2033 08/30/15 0501 08/30/15 1432 08/31/15 0451  BP: 159/71 159/68 148/72 162/66  Pulse: 88 91 86 95  Temp: 97.8 F (36.6 C) 97.9 F (36.6 C) 97.8 F (36.6 C) 97.9 F (36.6 C)  TempSrc: Axillary Axillary Axillary Axillary  Resp: 20 20 18 18   Height:      Weight:      SpO2: 96% 100% 98% 98%   General appearance: Somnolent this morning. Not cooperative. Not communicative. Resp: clear to auscultation bilaterally Cardio: regular rate and rhythm, S1, S2 normal, no murmur, click, rub or gallop GI: soft, non-tender; bowel sounds normal; no masses,  no organomegaly  DISPOSITION: Residential hospice    ALLERGIES:  Allergies  Allergen Reactions  . Cortisone Other (See Comments)    Injection-altered  mental status, didn't know her name, where she lived, etc.     . Oxycodone Other (See Comments)    Caused patient to go crazy  . Penicillins Rash    Per [facility]       Current Discharge Medication List    START taking these medications   Details  dextrose 5 % solution Inject 1,000 mLs into the vein continuous. At 17ml/h Qty: 50 mL    LORazepam (ATIVAN) 2 MG/ML injection Inject 0.25-0.5 mLs (0.5-1 mg total) into the vein every 6 (six) hours as needed (agitation). Qty: 1 mL, Refills: 0    QUEtiapine (SEROQUEL) 25 MG tablet Take 1 tablet (25 mg total) by mouth 3 (three) times daily.      CONTINUE these medications which have NOT CHANGED   Details  acetaminophen (TYLENOL) 325 MG tablet Take 325 mg by mouth 3 (three) times daily.    feeding supplement (ENSURE IMMUNE HEALTH) LIQD Take 237 mLs by mouth 3 (three) times daily with meals.      STOP taking these medications     alum & mag hydroxide-simeth (MAALOX/MYLANTA) 200-200-20 MG/5ML suspension      calcitRIOL (ROCALTROL) 0.25 MCG capsule      candesartan (ATACAND) 4 MG tablet      ferrous sulfate 325 (65 FE) MG tablet      gabapentin (NEURONTIN) 100 MG capsule      guaifenesin (ROBITUSSIN) 100 MG/5ML syrup  insulin aspart (NOVOLOG) 100 UNIT/ML injection      loperamide (IMODIUM) 2 MG capsule      LORazepam (ATIVAN) 0.5 MG tablet      magnesium hydroxide (MILK OF MAGNESIA) 400 MG/5ML suspension      memantine (NAMENDA) 10 MG tablet      Multiple Vitamin (MULTIVITAMIN) tablet      neomycin-bacitracin-polymyxin (NEOSPORIN) [phone] ointment      pantoprazole (PROTONIX) 40 MG tablet      potassium chloride SA (K-DUR,KLOR-CON) 20 MEQ tablet      rosuvastatin (CRESTOR) 20 MG tablet      thiothixene (NAVANE) 1 MG capsule      venlafaxine (EFFEXOR) 75 MG tablet      vitamin B-12 (CYANOCOBALAMIN) 1000 MCG tablet          TOTAL DISCHARGE TIME: 35 minutes  [facility] Pager  [phone]  08/31/2015, 10:06 AM

## 2015-08-31 NOTE — Discharge Instructions (Signed)
Hospice °Hospice is a service that is designed to provide people who are terminally ill and their families with medical, spiritual, and psychological support. Its aim is to improve your quality of life by keeping you as alert and comfortable as possible. Hospice is performed by a team of health care professionals and volunteers who: °· Help keep you comfortable. Hospice can be provided in your home or in a homelike setting. The hospice staff works with your family and friends to help meet your needs. You will enjoy the support of loved ones by receiving much of your basic care from family and friends. °· Provide pain relief and manage your symptoms. The staff supply all necessary medicines and equipment. °· Provide companionship when you are alone. °· Allow you and your family to rest. They may do light housekeeping, prepare meals, and run errands. °· Provide counseling. They will make sure your emotional, spiritual, and social needs and those of your family are being met. °· Provide spiritual care. Spiritual care is individualized to meet your needs and your family's needs. It may involve helping you look at what death means to you, say goodbye, or perform a specific religious ceremony or ritual. °Hospice teams often include: °· A nurse. °· A doctor. °· Social workers. °· Religious leaders (such as a chaplain). °· Trained volunteers. °WHEN SHOULD HOSPICE CARE BEGIN? °Most people who use hospice are believed to have fewer than 6 months to live. Your family and health care providers can help you decide when hospice services should begin. If your condition improves, you may discontinue the program. °WHAT SHOULD I CONSIDER BEFORE SELECTING A PROGRAM? °Most hospice programs are run by nonprofit, independent organizations. Some are affiliated with hospitals, nursing homes, or home health care agencies. Hospice programs can take place in the home or at a hospice center, hospital, or skilled nursing facility. When choosing  a hospice program, ask the following questions: °· What services are available to me? °· What services are offered to my loved ones? °· How involved are my loved ones? °· How involved is my health care provider? °· Who makes up the hospice care team? How are they trained or screened? °· How will my pain and symptoms be managed? °· If my circumstances change, can the services be provided in a different setting, such as my home or in the hospital? °· Is the program reviewed and licensed by the state or certified in some other way? °WHERE CAN I LEARN MORE ABOUT HOSPICE? °You can learn about existing hospice programs in your area from your health care providers. You can also read more about hospice online. The websites of the following organizations contain helpful information: °· The National Hospice and Palliative Care Organization (NHPCO). °· The Hospice Association of America (HAA). °· The Hospice Education Institute. °· The American Cancer Society (ACS). °· Hospice Net. °  °This information is not intended to replace advice given to you by your health care provider. Make sure you discuss any questions you have with your health care provider. °  °Document Released: 11/04/2003 Document Revised: 07/23/2013 Document Reviewed: 05/28/2013 °Elsevier Interactive Patient Education ©2016 Elsevier Inc. ° °

## 2015-08-31 NOTE — Progress Notes (Signed)
08/31/15  1225  Called report to [facility] at [place]

## 2015-08-31 NOTE — Progress Notes (Signed)
[facility] [employer]    Received request from [facility] for family interest in [facility]. Chart reviewed. [facility] available today. Met with daughter, Ladona Mow at [facility] to complete paper work for transfer today.  Please fax discharge summary to [phone].  RN please call report to [phone].  Please arrange transport for as early in day as possible.  Thank you, Freddi Starr RN, [facility] Liaison [phone]

## 2015-09-30 DEATH — deceased
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
